# Patient Record
Sex: Female | Born: 1955 | Race: Black or African American | Hispanic: No | Marital: Single | Smoking: Former smoker
Health system: Southern US, Community
[De-identification: ages and names within clinical notes are randomized; demographics above are authoritative.]

## PROBLEM LIST (undated history)

## (undated) DIAGNOSIS — C50919 Malignant neoplasm of unspecified site of unspecified female breast: Secondary | ICD-10-CM

## (undated) DIAGNOSIS — E119 Type 2 diabetes mellitus without complications: Secondary | ICD-10-CM

## (undated) DIAGNOSIS — J45909 Unspecified asthma, uncomplicated: Secondary | ICD-10-CM

## (undated) DIAGNOSIS — Z9981 Dependence on supplemental oxygen: Secondary | ICD-10-CM

## (undated) DIAGNOSIS — I10 Essential (primary) hypertension: Secondary | ICD-10-CM

## (undated) DIAGNOSIS — K219 Gastro-esophageal reflux disease without esophagitis: Secondary | ICD-10-CM

## (undated) DIAGNOSIS — J189 Pneumonia, unspecified organism: Secondary | ICD-10-CM

## (undated) DIAGNOSIS — Q211 Atrial septal defect, unspecified: Secondary | ICD-10-CM

## (undated) DIAGNOSIS — F329 Major depressive disorder, single episode, unspecified: Secondary | ICD-10-CM

## (undated) DIAGNOSIS — IMO0001 Reserved for inherently not codable concepts without codable children: Secondary | ICD-10-CM

## (undated) DIAGNOSIS — G4730 Sleep apnea, unspecified: Secondary | ICD-10-CM

## (undated) DIAGNOSIS — M1990 Unspecified osteoarthritis, unspecified site: Secondary | ICD-10-CM

## (undated) DIAGNOSIS — F32A Depression, unspecified: Secondary | ICD-10-CM

## (undated) DIAGNOSIS — F419 Anxiety disorder, unspecified: Secondary | ICD-10-CM

## (undated) DIAGNOSIS — R079 Chest pain, unspecified: Secondary | ICD-10-CM

## (undated) DIAGNOSIS — J449 Chronic obstructive pulmonary disease, unspecified: Secondary | ICD-10-CM

## (undated) HISTORY — DX: Malignant neoplasm of unspecified site of unspecified female breast: C50.919

## (undated) HISTORY — DX: Type 2 diabetes mellitus without complications: E11.9

---

## 1997-04-15 ENCOUNTER — Encounter: Admission: RE | Admit: 1997-04-15 | Discharge: 1997-04-15 | Payer: Self-pay | Admitting: Family Medicine

## 1997-05-06 ENCOUNTER — Encounter: Admission: RE | Admit: 1997-05-06 | Discharge: 1997-05-06 | Payer: Self-pay | Admitting: Family Medicine

## 2003-08-09 ENCOUNTER — Ambulatory Visit (HOSPITAL_COMMUNITY): Admission: RE | Admit: 2003-08-09 | Discharge: 2003-08-09 | Payer: Self-pay | Admitting: Family Medicine

## 2003-10-18 ENCOUNTER — Ambulatory Visit: Payer: Self-pay | Admitting: Nurse Practitioner

## 2003-11-02 ENCOUNTER — Ambulatory Visit: Payer: Self-pay | Admitting: Nurse Practitioner

## 2003-12-05 ENCOUNTER — Emergency Department (HOSPITAL_COMMUNITY): Admission: EM | Admit: 2003-12-05 | Discharge: 2003-12-05 | Payer: Self-pay | Admitting: Family Medicine

## 2004-02-14 ENCOUNTER — Ambulatory Visit: Payer: Self-pay | Admitting: Nurse Practitioner

## 2004-02-17 ENCOUNTER — Ambulatory Visit: Payer: Self-pay | Admitting: Nurse Practitioner

## 2004-07-24 ENCOUNTER — Ambulatory Visit: Payer: Self-pay | Admitting: Family Medicine

## 2004-09-08 ENCOUNTER — Ambulatory Visit: Payer: Self-pay | Admitting: Nurse Practitioner

## 2004-11-23 ENCOUNTER — Ambulatory Visit: Payer: Self-pay | Admitting: Nurse Practitioner

## 2004-11-29 ENCOUNTER — Ambulatory Visit (HOSPITAL_COMMUNITY): Admission: RE | Admit: 2004-11-29 | Discharge: 2004-11-29 | Payer: Self-pay | Admitting: Internal Medicine

## 2005-01-15 HISTORY — PX: PARTIAL HYSTERECTOMY: SHX80

## 2005-01-15 HISTORY — PX: TOTAL ABDOMINAL HYSTERECTOMY: SHX209

## 2005-02-14 ENCOUNTER — Inpatient Hospital Stay (HOSPITAL_COMMUNITY): Admission: AD | Admit: 2005-02-14 | Discharge: 2005-02-16 | Payer: Self-pay | Admitting: Obstetrics & Gynecology

## 2005-03-12 ENCOUNTER — Ambulatory Visit (HOSPITAL_COMMUNITY): Admission: RE | Admit: 2005-03-12 | Discharge: 2005-03-12 | Payer: Self-pay | Admitting: Obstetrics & Gynecology

## 2005-04-12 ENCOUNTER — Encounter (INDEPENDENT_AMBULATORY_CARE_PROVIDER_SITE_OTHER): Payer: Self-pay | Admitting: Specialist

## 2005-04-12 ENCOUNTER — Inpatient Hospital Stay (HOSPITAL_COMMUNITY): Admission: RE | Admit: 2005-04-12 | Discharge: 2005-04-13 | Payer: Self-pay | Admitting: Obstetrics & Gynecology

## 2007-05-01 ENCOUNTER — Ambulatory Visit (HOSPITAL_COMMUNITY): Admission: RE | Admit: 2007-05-01 | Discharge: 2007-05-01 | Payer: Self-pay | Admitting: Internal Medicine

## 2008-08-01 ENCOUNTER — Emergency Department (HOSPITAL_COMMUNITY): Admission: EM | Admit: 2008-08-01 | Discharge: 2008-08-01 | Payer: Self-pay | Admitting: Emergency Medicine

## 2009-08-08 ENCOUNTER — Encounter: Admission: RE | Admit: 2009-08-08 | Discharge: 2009-08-08 | Payer: Self-pay | Admitting: Internal Medicine

## 2010-02-04 ENCOUNTER — Encounter: Payer: Self-pay | Admitting: Internal Medicine

## 2010-02-05 ENCOUNTER — Encounter: Payer: Self-pay | Admitting: Internal Medicine

## 2010-04-23 LAB — BASIC METABOLIC PANEL
BUN: 9 mg/dL (ref 6–23)
CO2: 24 mEq/L (ref 19–32)
Calcium: 9.7 mg/dL (ref 8.4–10.5)
Chloride: 106 mEq/L (ref 96–112)
Creatinine, Ser: 0.77 mg/dL (ref 0.4–1.2)
GFR calc Af Amer: >60 mL/min (ref >60)
GFR calc non Af Amer: >60 mL/min (ref >60)
Glucose, Bld: 122 mg/dL — ABNORMAL HIGH (ref 70–99)
Potassium: 3.8 mEq/L (ref 3.5–5.1)
Sodium: 136 mEq/L (ref 135–145)

## 2010-04-23 LAB — URINALYSIS, ROUTINE W REFLEX MICROSCOPIC
Bilirubin Urine: NEGATIVE
Glucose, UA: NEGATIVE mg/dL
Hgb urine dipstick: NEGATIVE
Ketones, ur: NEGATIVE mg/dL
Nitrite: NEGATIVE
Protein, ur: NEGATIVE mg/dL
Specific Gravity, Urine: 1.018 (ref 1.005–1.030)
Urobilinogen, UA: 1 mg/dL (ref 0.0–1.0)
pH: 6.5 (ref 5.0–8.0)

## 2010-04-23 LAB — CBC
HCT: 39.4 % (ref 36.0–46.0)
Hemoglobin: 13.5 g/dL (ref 12.0–15.0)
MCHC: 34.3 g/dL (ref 30.0–36.0)
MCV: 92.3 fL (ref 78.0–100.0)
Platelets: 222 10*3/uL (ref 150–400)
RBC: 4.27 MIL/uL (ref 3.87–5.11)
RDW: 13.9 % (ref 11.5–15.5)
WBC: 4.9 10*3/uL (ref 4.0–10.5)

## 2010-04-23 LAB — URINE CULTURE: Colony Count: 60000

## 2010-04-23 LAB — DIFFERENTIAL
Basophils Absolute: 0 10*3/uL (ref 0.0–0.1)
Basophils Relative: 0 % (ref 0–1)
Eosinophils Absolute: 0.1 10*3/uL (ref 0.0–0.7)
Eosinophils Relative: 1 % (ref 0–5)
Lymphocytes Relative: 15 % (ref 12–46)
Lymphs Abs: 0.7 10*3/uL (ref 0.7–4.0)
Monocytes Absolute: 0.3 10*3/uL (ref 0.1–1.0)
Monocytes Relative: 5 % (ref 3–12)
Neutro Abs: 3.8 10*3/uL (ref 1.7–7.7)
Neutrophils Relative %: 78 % — ABNORMAL HIGH (ref 43–77)

## 2010-06-02 NOTE — Discharge Summary (Signed)
[name], [name]                 ACCOUNT NO.:  [id number]   MEDICAL RECORD NO.:  [id number]          PATIENT TYPE:  INP   LOCATION:                                FACILITY:  [facility]   PHYSICIAN:  Roseanna Rainbow, M.D.DATE OF BIRTH:  October 05, 1955   DATE OF ADMISSION:  DATE OF DISCHARGE:  02/16/2005                                 DISCHARGE SUMMARY   CHIEF COMPLAINT:  The patient is 55 year old with uterine fibroids secondary  menorrhagia, now with heavy bleeding.  Please see the dictated history and  physical.   HOSPITAL COURSE:  The patient was admitted and started on high-dose Premarin  and Provera.  Her bleeding diminished in response to this therapy.  Her  hemoglobin on admission was 8.4.  On hospital day #1 it was 8.1.  TSH and  prolactin as well as PT/PTT were within normal limits.  She was discharged  to home on hospital day #2 and she was to continue the Premarin orally.  She  was also given a dose of Depo-Provera 300 mg on the day of discharge.   DISCHARGE DIAGNOSES:  1.  Uterine fibroid.  2.  Vaginal hemorrhage.   CONDITION ON DISCHARGE:  Stable.   DIET:  Regular.   ACTIVITY:  Ad lib.   MEDICATIONS:  1.  Premarin 2.5 mg p.o. b.i.d.  2.  She was to resume home medications.   DISPOSITION:  The patient was to follow up in the office in two weeks.      Roseanna Rainbow, M.D.  Electronically Signed     LAJ/MEDQ  D:  03/02/2005  T:  03/03/2005  Job:  [id number]

## 2010-06-02 NOTE — H&P (Signed)
NAMEKRISSA, [name]                 ACCOUNT NO.:  [id number]   MEDICAL RECORD NO.:  [id number]           PATIENT TYPE:   LOCATION:                                 FACILITY:   PHYSICIAN:  Roseanna Rainbow, M.D.DATE OF BIRTH:  March 10, 1955   DATE OF ADMISSION:  DATE OF DISCHARGE:                                HISTORY & PHYSICAL   CHIEF COMPLAINT:  The patient is a 55 year old with symptomatic uterine  fibroids and a suspected endometrial polyp who presents for diagnostic  laparoscopy, likely total vaginal hysterectomy, possible total abdominal  hysterectomy.   HISTORY OF PRESENT ILLNESS:  The patient gives a one-year history of heavy  menses and painful menses.  Recently the cycles have become irregular  lasting from 7-10 days.  She has a history of secondary anemia.  Her  symptoms have been refractory to attempts at medical management.  Work-up to  date has included a pelvic ultrasound as well as a sonohysterogram.  The  ultrasound was remarkable for multiple small fibroids.  Uterus is  retroverted and there was focal endometrial thickening consistent with a  polyp.  A pelvic ultrasound again demonstrated the sagittal diameter of the  uterus to be approximately 11 cm.  A recent hemoglobin on March 19 was 12.2.   ALLERGIES:  No known drug allergies.   MEDICATIONS:  Hydrochlorothiazide, Prozac, albuterol, Advair, Singulair,  Ambien, Vicodin.   PAST MEDICAL HISTORY:  1.  Asthma.  2.  Depression.  3.  Sarcoidosis.   SOCIAL HISTORY:  She is single.  She recently stopped using cigarettes.  She  drinks a moderate amount of alcoholic beverages.  Denies illicit drug use.   FAMILY HISTORY:  Thyroid disorder.   PAST OB/GYN HISTORY:  Please see the above.  She is status post a postpartum  tubal ligation.  She has been pregnant five times.  She has three living  children.   REVIEW OF SYSTEMS:  GU:  Please see the history of present illness.   PHYSICAL EXAMINATION:  VITAL SIGNS:   Stable, afebrile.  CONSTITUTIONAL:  African-American female.  Appears stated age.  Thin.  ABDOMEN:  Soft, nontender without masses.  PELVIC:  Normal EGBUS.  On speculum examination there is small menstrual  blood present.  On the bimanual examination the uterus is mildly enlarged.  The position is retroverted, nontender.  The adnexa are without masses,  nontender.   ASSESSMENT:  Small fibroid uterus, symptomatic.  Also rule out an  endometrial polyp.  The patient with multiple medical problems.  Preoperative clearance has been granted by Dr. Concepcion Elk of [facility].  The planned  procedure is a diagnostic laparoscopy of likely total vaginal hysterectomy,  possible total abdominal hysterectomy.  The risks, benefits, and alternative  forms of management were reviewed with the patient and informed consent had  been obtained.      Roseanna Rainbow, M.D.  Electronically Signed     LAJ/MEDQ  D:  04/11/2005  T:  04/11/2005  Job:  [id number]

## 2010-06-02 NOTE — H&P (Signed)
[name], PFAHLER                 ACCOUNT NO.:  [id number]   MEDICAL RECORD NO.:  [id number]          PATIENT TYPE:  INP   LOCATION:  9317                          FACILITY:  [facility]   PHYSICIAN:  Roseanna Rainbow, M.D.DATE OF BIRTH:  1955-10-19   DATE OF ADMISSION:  02/14/2005  DATE OF DISCHARGE:                                HISTORY & PHYSICAL   CHIEF COMPLAINT:  The patient is a 55 year old para 0 with uterine fibroids  with secondary menorrhagia, now with heavy bleeding with menses.   HISTORY OF PRESENT ILLNESS:  As above.  The patient gives a history of heavy  menses and dysmenorrhea for 3 years.  She has previously been diagnosed with  anemia.  There has been no previous attempt to manage the menorrhagia  medically.  An ultrasound in November of 2006 demonstrated a uterus that is  retroverted and 11 cm in sagittal diameter with 3 discrete fibroids  measuring 4-5 cm.  There is a question of an endometrial polyp, as well.   PAST MEDICAL HISTORY:  1.  Sarcoidosis.  2.  Asthma.  3.  Depression.   FAMILY HISTORY:  Diabetes, thyroid disease.   SOCIAL HISTORY:  History of tobacco use.  Recently quit.  Increased alcohol  intake.  She reports 10-12 alcoholic beverages per week and increased  caffeinated beverage use.   PHYSICAL EXAMINATION:  VITAL SIGNS:  Stable.  Afebrile.  Blood pressure in  the office earlier today was 190/90.  GENERAL:  Thin African-American female in no apparent distress.  PELVIC:  On speculum exam, there is pooling of blood noted.  On bimanual  exam, the uterus is retroverted, upper limits of normal size, nontender.  The adnexa are non-palpable.   ASSESSMENT:  1.  Uterine fibroids with secondary menorrhagia, now with vaginal      hemorrhage.  2.  History of anemia.  3.  Multiple medical problems.   PLAN:  1.  Admission.  2.  Will check a CBC and coag profile.  3.  Will start high-dose Premarin and check pad counts.  4.  Resume home  medications.      Roseanna Rainbow, M.D.  Electronically Signed     LAJ/MEDQ  D:  02/14/2005  T:  02/14/2005  Job:  [id number]

## 2010-06-02 NOTE — Op Note (Signed)
NAMENICHOL, [name]                 ACCOUNT NO.:  [id number]   MEDICAL RECORD NO.:  [id number]          PATIENT TYPE:  INP   LOCATION:  9317                          FACILITY:  [facility]   PHYSICIAN:  Roseanna Rainbow, M.D.DATE OF BIRTH:  [birth year]   DATE OF PROCEDURE:  04/12/2005  DATE OF DISCHARGE:  04/13/2005                                 OPERATIVE REPORT   PREOPERATIVE DIAGNOSES:  1.  Symptomatic uterine fibroids.  2.  Rule out endometrial polyp.  3.  Secondary dysmenorrhea.  4.  Menometrorrhagia refractory to attempts at medical management.   POSTOPERATIVE DIAGNOSES:  1.  Symptomatic uterine fibroids.  2.  Rule out endometrial polyp.  3.  Secondary dysmenorrhea.  4.  Menometrorrhagia refractory to attempts at medical management.   PROCEDURES:  1.  Diagnostic laparoscopy.  2.  Total vaginal hysterectomy with coring.   SURGEONS:  Roseanna Rainbow, M.D., Bing Neighbors. Clearance Coots, M.D.   ANESTHESIA:  General endotracheal.   COMPLICATIONS:  None.   ESTIMATED BLOOD LOSS:  150 mL.   FLUIDS:  As per anesthesiology.   FINDINGS:  Exam under anesthesia:  A moderately enlarged, retroverted  uterus.  At diagnostic laparoscopy, there was a filmy adhesion extending  from the left tube to the anterior abdominal wall, and this was divided with  Endoshears.  The uterus was globular-appearing with irregular contour and  filled the pelvis posteriorly.  The ovaries appeared normal.  The appendix  was normal-appearing as well.   PROCEDURES:  The risks, benefits, indications and alternatives of the  procedure were reviewed with the patient.  Informed consent had been  obtained.  She was taken to the operating room with an IV running.  She was  placed in the dorsal lithotomy position, prepped and draped in usual sterile  fashion.  Attention was then turned to the patient's abdomen, where a 10 mm  skin incision was made in the umbilical fold.  The fascia was then tented up  with Kocher  clamps and entered sharply.  The parietal peritoneum was tented  up and entered sharply as well.  The peritoneal incision was felt to be free  of adhesions.  The trocar and sleeve were then advanced into the abdomen.  The pneumoperitoneum was obtained.  A survey of the patient's pelvis and  abdomen revealed the above findings.  At this point and the pelvic portion  of the procedure was begun.  A weighted speculum was placed into the vagina.  The cervix was grasped with a Christella Hartigan tenaculum.  The cervix was then  injected cervix circumferentially with 1% lidocaine with 2:100,000 of  epinephrine.  The cervix was circumferentially incised with the scalpel and  the bladder was dissected off the pubovesical cervical fascia anteriorly  with Metzenbaum scissors.  The same procedure was performed posteriorly.  The posterior cul-de-sac was entered sharply without difficulty.  At this  point parametrial clamps were placed over the uterosacral ligaments on  either side.  These were transected and suture ligated with 0 Vicryl.  Hemostasis was assured.  The cardinal ligaments were clamped on both sides,  transected and suture ligated in a similar fashion.  At this point the  anterior cul-de-sac was entered sharply.  The uterine arteries were then  serially clamped with parametrial clamps, transected and suture ligated on  both sides.  At this point a coring technique was used to debulk the uterus.  Both cornua were then ultimately clamped with parametrial clamps, transected  and the uterus delivered.  These pedicles were suture ligated with excellent  hemostasis.  The peritoneum was closed with pursestring suture of 2-0  Vicryl.  The vaginal cuff angles were closed with figure-of-eight stitches  of 0 Vicryl.  The remainder of the vaginal cuff was closed with figure-of-  eight stitches of 0 Vicryl in an interrupted fashion.  At this point  attention was then turned to the abdomen.  A pneumoperitoneum was  then  obtained.  A survey of the pelvis was consistent with adequate hemostasis.  The instruments were then removed from the patient's abdomen and the fascial  incision was reapproximated with 0 Vicryl.  The skin was reapproximated with  4-0 Monocryl and Dermabond.  The patient tolerated the procedure well.  The  sponge, lap and needle counts were correct x2.  The patient was taken to the  PACU awake and in stable condition.      Roseanna Rainbow, M.D.  Electronically Signed     LAJ/MEDQ  D:  04/12/2005  T:  04/14/2005  Job:  [id number]

## 2012-04-07 ENCOUNTER — Other Ambulatory Visit: Payer: Self-pay

## 2012-04-07 DIAGNOSIS — Z1231 Encounter for screening mammogram for malignant neoplasm of breast: Secondary | ICD-10-CM

## 2012-04-25 ENCOUNTER — Ambulatory Visit: Payer: Self-pay

## 2012-05-07 ENCOUNTER — Ambulatory Visit: Payer: Self-pay

## 2012-05-14 ENCOUNTER — Ambulatory Visit
Admission: RE | Admit: 2012-05-14 | Discharge: 2012-05-14 | Disposition: A | Discharge status: Home / Self Care | Payer: PRIVATE HEALTH INSURANCE | Source: Ambulatory Visit

## 2012-05-14 DIAGNOSIS — Z1231 Encounter for screening mammogram for malignant neoplasm of breast: Secondary | ICD-10-CM

## 2012-11-03 ENCOUNTER — Other Ambulatory Visit: Payer: Self-pay | Admitting: Internal Medicine

## 2012-11-03 DIAGNOSIS — K219 Gastro-esophageal reflux disease without esophagitis: Secondary | ICD-10-CM

## 2012-11-07 ENCOUNTER — Ambulatory Visit
Admission: RE | Admit: 2012-11-07 | Discharge: 2012-11-07 | Disposition: A | Discharge status: Home / Self Care | Payer: PRIVATE HEALTH INSURANCE | Source: Ambulatory Visit | Attending: Internal Medicine | Admitting: Internal Medicine

## 2012-11-07 DIAGNOSIS — K219 Gastro-esophageal reflux disease without esophagitis: Secondary | ICD-10-CM

## 2012-11-12 ENCOUNTER — Other Ambulatory Visit: Payer: Self-pay | Admitting: Internal Medicine

## 2012-12-03 ENCOUNTER — Other Ambulatory Visit: Payer: Self-pay | Admitting: Internal Medicine

## 2013-01-01 ENCOUNTER — Other Ambulatory Visit: Payer: Self-pay | Admitting: Internal Medicine

## 2013-01-06 ENCOUNTER — Other Ambulatory Visit: Payer: Self-pay | Admitting: Internal Medicine

## 2013-02-03 ENCOUNTER — Other Ambulatory Visit: Payer: Self-pay | Admitting: Internal Medicine

## 2013-02-26 ENCOUNTER — Other Ambulatory Visit: Payer: Self-pay | Admitting: Internal Medicine

## 2013-05-19 ENCOUNTER — Other Ambulatory Visit: Payer: Self-pay

## 2013-05-19 DIAGNOSIS — Z1231 Encounter for screening mammogram for malignant neoplasm of breast: Secondary | ICD-10-CM

## 2013-06-03 ENCOUNTER — Ambulatory Visit
Admission: RE | Admit: 2013-06-03 | Discharge: 2013-06-03 | Disposition: A | Discharge status: Home / Self Care | Payer: PRIVATE HEALTH INSURANCE | Source: Ambulatory Visit

## 2013-06-03 ENCOUNTER — Encounter (INDEPENDENT_AMBULATORY_CARE_PROVIDER_SITE_OTHER): Payer: Self-pay

## 2013-06-03 DIAGNOSIS — Z1231 Encounter for screening mammogram for malignant neoplasm of breast: Secondary | ICD-10-CM

## 2014-01-15 DIAGNOSIS — R079 Chest pain, unspecified: Secondary | ICD-10-CM

## 2014-01-15 HISTORY — DX: Chest pain, unspecified: R07.9

## 2014-06-25 ENCOUNTER — Encounter: Payer: Self-pay | Admitting: Internal Medicine

## 2014-06-25 ENCOUNTER — Ambulatory Visit (INDEPENDENT_AMBULATORY_CARE_PROVIDER_SITE_OTHER): Payer: Medicare Other | Admitting: Internal Medicine

## 2014-06-25 VITALS — BP 140/76 | HR 71 | Ht 67.0 in | Wt 202.8 lb

## 2014-06-25 DIAGNOSIS — I10 Essential (primary) hypertension: Secondary | ICD-10-CM

## 2014-06-25 DIAGNOSIS — R0600 Dyspnea, unspecified: Secondary | ICD-10-CM

## 2014-06-25 DIAGNOSIS — J449 Chronic obstructive pulmonary disease, unspecified: Secondary | ICD-10-CM

## 2014-06-25 MED ORDER — MOMETASONE FURO-FORMOTEROL FUM 100-5 MCG/ACT IN AERO: INHALATION_SPRAY | RESPIRATORY_TRACT | Status: DC

## 2014-06-25 NOTE — Progress Notes (Signed)
   Subjective:    [name], female    DOB: December 17, 1955,   MRN: [id number]  HPI  9 yobf quit smoking 2010 with onset doe 2008 and when quit smoking wt around 170-180 with breathing gradually worse since quit smoking referred to pulmonary clinic 06/25/14 by Dr Rickey Barbara    06/25/2014 1st [facility] office visit/ Consandra Laske   Chief Complaint  Patient presents with  . Pulmonary Consult    Referred by Dr. Justice Britain. Pt c/o SOB- started a few months ago. She is concerned about mold in her home causing her breathing problem. She gets SOB walking up an incline. She is using proair 2-3 x per day and neb 4-5 x per day.   gradually worse doe  x 1 year, better with inhaler and neb but not advair with raspy upper airway coughing fits more day than noct assoc with overt HB  No obvious day to day or daytime variabilty or assoc classically pleuritic or ex  cp or chest tightness, subjective wheeze overt sinus or hb symptoms. No unusual exp hx or h/o childhood pna/ asthma or knowledge of premature birth.  Sleeping ok without nocturnal  or early am exacerbation  of respiratory  c/o's or need for noct saba. Also denies any obvious fluctuation of symptoms with weather or environmental changes or other aggravating or alleviating factors except as outlined above   Current Medications, Allergies, Complete Past Medical History, Past Surgical History, Family History, and Social History were reviewed in [employer] record.             Review of Systems  Constitutional: Negative for fever, chills and unexpected weight change.  HENT: Positive for congestion. Negative for dental problem, ear pain, nosebleeds, postnasal drip, rhinorrhea, sinus pressure, sneezing, sore throat, trouble swallowing and voice change.   Eyes: Negative for visual disturbance.  Respiratory: Positive for shortness of breath. Negative for cough and choking.   Cardiovascular:  Negative for chest pain and leg swelling.  Gastrointestinal: Negative for vomiting, abdominal pain and diarrhea.  Genitourinary: Negative for difficulty urinating.       Indigestion  Musculoskeletal: Positive for arthralgias.  Skin: Negative for rash.  Neurological: Negative for tremors, syncope and headaches.  Hematological: Does not bruise/bleed easily.       Objective:   Physical Exam  Very hoarse amb bf  Wt Readings from Last 3 Encounters:  06/25/14 202 lb 12.8 oz (91.989 kg)    Vital signs reviewed    HEENT: edentulousl with nl turbinates, and orophanx. Nl external ear canals without cough reflex   NECK :  without JVD/Nodes/TM/ nl carotid upstrokes bilaterally   LUNGS: no acc muscle use, clear to A and P bilaterally without cough on insp or exp maneuvers   CV:  RRR  no s3 or murmur or increase in P2, no edema   ABD:  soft and nontender with nl excursion in the supine position. No bruits or organomegaly, bowel sounds nl  MS:  warm without deformities, calf tenderness, cyanosis or clubbing  SKIN: warm and dry without lesions    NEURO:  alert, approp, no deficits    cxr rec / not done       Assessment & Plan:

## 2014-06-25 NOTE — Patient Instructions (Addendum)
Stop advair  Start dulera 100 Take 2 puffs first thing in am and then another 2 puffs about 12 hours later.   Only use your albuterol (proair) as a rescue medication to be used if you can't catch your breath by resting or doing a relaxed purse lip breathing pattern.  - The less you use it, the better it will work when you need it. - Ok to use up to 2 puffs  every 4 hours if you must but call for immediate appointment if use goes up over your usual need - Don't leave home without it !!  (think of it like the spare tire for your car)   Only use nebulizer if you first try proair and it doesn't work   Nexium 40 mg   Take  30-60 min before first meal of the day and Pepcid (famotidine)  20 mg one @  bedtime until return to office - this is the best way to tell whether stomach acid is contributing to your problem.    GERD (REFLUX)  is an extremely common cause of respiratory symptoms just like yours , many times with no obvious heartburn at all.    It can be treated with medication, but also with lifestyle changes including avoidance of late meals, elevation of the head of your bed (ideally with 6 inch  bed blocks) excessive alcohol, smoking cessation, and avoid fatty foods, chocolate, peppermint, colas, red wine, and acidic juices such as orange juice.  NO MINT OR MENTHOL PRODUCTS SO NO COUGH DROPS  USE SUGARLESS CANDY INSTEAD (Jolley ranchers or Stover's or Life Savers) or even ice chips will also do - the key is to swallow to prevent all throat clearing. NO OIL BASED VITAMINS - use powdered substitutes.    See Tammy NP w/in 2 weeks with all your medications, even over the counter meds, separated in two separate bags, the ones you take no matter what vs the ones you stop once you feel better and take only as needed when you feel you need them.   Tammy  will generate for you a new user friendly medication calendar that will put [place] all on the same page re: your medication use.     Without this  process, it simply isn't possible to assure that we are providing  your outpatient care  with  the attention to detail we feel you deserve.   If we cannot assure that you're getting that kind of care,  then we cannot manage your problem effectively from this clinic.  Once you have seen Tammy and we are sure that we're all on the same page with your medication use she will arrange follow up with me with pfts on return   Please remember to go to the  x-ray department downstairs for your tests - we will call you with the results when they are available. Late add: did not go for xrays as requested / consider change to bisoprolol prior to pfts

## 2014-06-26 ENCOUNTER — Encounter: Payer: Self-pay | Admitting: Internal Medicine

## 2014-06-26 DIAGNOSIS — J449 Chronic obstructive pulmonary disease, unspecified: Secondary | ICD-10-CM | POA: Insufficient documentation

## 2014-06-26 DIAGNOSIS — I10 Essential (primary) hypertension: Secondary | ICD-10-CM | POA: Insufficient documentation

## 2014-06-26 DIAGNOSIS — J432 Centrilobular emphysema: Secondary | ICD-10-CM | POA: Insufficient documentation

## 2014-06-26 NOTE — Assessment & Plan Note (Signed)
Consider if remains beta agonist dep:  i  Bystolic, the most beta -1  selective Beta blocker available in sample form, with bisoprolol the most selective generic choice  on the market.

## 2014-06-26 NOTE — Assessment & Plan Note (Addendum)
  I reviewed the Fletcher curve with the patient that basically indicates  if you quit smoking when your best day FEV1 is still well preserved (as is probably   the case here)  it is highly unlikely you will progress to severe disease and informed the patient there was no medication on the market that has proven to alter the curve/ its downward trajectory  or the likelihood of progression of their disease.  Therefore stopping smoking and maintaining abstinence is the most important aspect of care, not choice of inhalers or for that matter, doctors.    Will sort out what she needs at f/u ov with pfts   I had an extended discussion with the patient reviewing all relevant studies completed to date and  lasting 53 m Each maintenance medication was reviewed in detail including most importantly the difference between maintenance and as needed and under what circumstances the prns are to be used.  Please see instructions for details which were reviewed in writing and the patient given a copy.

## 2014-06-26 NOTE — Assessment & Plan Note (Addendum)
  When respiratory symptoms begin or become refractory well after a patient reports complete smoking cessation,  Especially when this wasn't the case while they were smoking, a red flag is raised based on the work of Dr Kris Mouton which states:  if you quit smoking when your best day FEV1 is still well preserved it is highly unlikely you will progress to severe disease.  That is to say, once the smoking stops,  the symptoms should not suddenly erupt or markedly worsen.  If so, the differential diagnosis should include  obesity/deconditioning,  LPR/Reflux/Aspiration syndromes,  occult CHF, or  especially side effect of medications commonly used in this population.    ? LPR >> Acid (or non-acid) GERD contributing  > always difficult to exclude as up to 75% of pts in some series report no assoc GI/ Heartburn symptoms and she has overt HB symptoms which raises the likelihood to 100%)   > rec max (24h)  acid suppression and diet restrictions/ reviewed and instructions given in writing.  Advair not helping her as much as saba suggesting she may have a significant upper airway issue (85% of this drug ends up in the upper airway where it is not needed even with good technique and her upper airway may be irritable from LPR)   ? Adverse drug effect >>? advair side effects> try change to dulera 100 pending pfts   ? Obesity/ deconditioning  Body mass index is 31.76 kg/(m^2).  > she has gained about 25 lbs since stopped smoking and would certainly help to get back down to baseline wt

## 2014-07-09 ENCOUNTER — Encounter: Payer: Self-pay | Admitting: Adult Health

## 2014-07-09 ENCOUNTER — Ambulatory Visit (INDEPENDENT_AMBULATORY_CARE_PROVIDER_SITE_OTHER): Payer: Medicare Other | Admitting: Adult Health

## 2014-07-09 ENCOUNTER — Ambulatory Visit (INDEPENDENT_AMBULATORY_CARE_PROVIDER_SITE_OTHER)
Admission: RE | Admit: 2014-07-09 | Discharge: 2014-07-09 | Disposition: A | Discharge status: Home / Self Care | Payer: Medicare Other | Source: Ambulatory Visit | Attending: Internal Medicine | Admitting: Internal Medicine

## 2014-07-09 VITALS — BP 142/66 | HR 76 | Temp 98.0°F | Ht 67.0 in | Wt 200.0 lb

## 2014-07-09 DIAGNOSIS — R0600 Dyspnea, unspecified: Secondary | ICD-10-CM | POA: Diagnosis not present

## 2014-07-09 DIAGNOSIS — J449 Chronic obstructive pulmonary disease, unspecified: Secondary | ICD-10-CM | POA: Diagnosis not present

## 2014-07-09 DIAGNOSIS — I10 Essential (primary) hypertension: Secondary | ICD-10-CM | POA: Diagnosis not present

## 2014-07-09 MED ORDER — MOMETASONE FURO-FORMOTEROL FUM 200-5 MCG/ACT IN AERO: 2.0000 | INHALATION_SPRAY | Freq: Two times a day (BID) | RESPIRATORY_TRACT | Status: DC

## 2014-07-09 MED ORDER — BISOPROLOL FUMARATE 10 MG PO TABS: 10.0000 mg | ORAL_TABLET | Freq: Two times a day (BID) | ORAL | Status: DC

## 2014-07-09 NOTE — Assessment & Plan Note (Signed)
Patient is on high-dose nonselective beta blocker  plan Stop Metoprolol .  Begin Bisoprolol 10mg  .Twice daily   follow up [name]  In 3 weeks with PFT  Please contact office for sooner follow up if symptoms do not improve or worsen or seek emergency care

## 2014-07-09 NOTE — Progress Notes (Signed)
   Subjective:    [name], female    DOB: 1955/09/29,   MRN: [id number]  HPI  21 yobf quit smoking 2010 with onset doe 2008 and when quit smoking wt around 170-180 with breathing gradually worse since quit smoking referred to pulmonary clinic 06/25/14 by Dr Rickey Barbara    06/25/2014 1st [facility] office visit/ Wert   Chief Complaint  Patient presents with  . Pulmonary Consult    Referred by Dr. Justice Britain. Pt c/o SOB- started a few months ago. She is concerned about mold in her home causing her breathing problem. She gets SOB walking up an incline. She is using proair 2-3 x per day and neb 4-5 x per day.   gradually worse doe  x 1 year, better with inhaler and neb but not advair with raspy upper airway coughing fits more day than noct assoc with overt HB >>changed Advair to [facility]  07/09/2014 follow-up and medication review Patient returns for a two-week follow-up and medication review. He was seen last visit for a pulmonary consult for shortness of breath and possible COPD. She was changed from Advair to [facility].  Pt c/o dry cough, occasional wheezing. Pt states overall breathing has improved.  He denies any hemoptysis, chest pain, orthopnea, PND, or increased leg swelling. Patient has not had PFTs. And she did not do her chest x-ray as recommended last visit.   Current Medications, Allergies, Complete Past Medical History, Past Surgical History, Family History, and Social History were reviewed in [employer] record.             Review of Systems  Constitutional: Negative for fever, chills and unexpected weight change.  HENT: Positive for congestion. Negative for dental problem, ear pain, nosebleeds, postnasal drip, rhinorrhea, sinus pressure, sneezing, sore throat, trouble swallowing and voice change.   Eyes: Negative for visual disturbance.  Respiratory: Positive for shortness of breath. Negative for cough and  choking.   Cardiovascular: Negative for chest pain and leg swelling.  Gastrointestinal: Negative for vomiting, abdominal pain and diarrhea.  Genitourinary: Negative for difficulty urinating.       Indigestion  Musculoskeletal: Positive for arthralgias.  Skin: Negative for rash.  Neurological: Negative for tremors, syncope and headaches.  Hematological: Does not bruise/bleed easily.       Objective:   Physical Exam  Very hoarse amb bf   Vital signs reviewed    HEENT: edentulousl with nl turbinates, and orophanx. Nl external ear canals without cough reflex   NECK :  without JVD/Nodes/TM/ nl carotid upstrokes bilaterally   LUNGS: no acc muscle use, clear to A and P bilaterally without cough on insp or exp maneuvers   CV:  RRR  no s3 or murmur or increase in P2, no edema   ABD:  soft and nontender with nl excursion in the supine position. No bruits or organomegaly, bowel sounds nl  MS:  warm without deformities, calf tenderness, cyanosis or clubbing  SKIN: warm and dry without lesions    NEURO:  alert, approp, no deficits          Assessment & Plan:

## 2014-07-09 NOTE — Patient Instructions (Signed)
Stop Metoprolol .  Begin Bisoprolol 10mg  .Twice daily   Chest xray today .  Follow med calendar closely and bring to each visit.  follow up [name]  In 3 weeks with PFT  Please contact office for sooner follow up if symptoms do not improve or worsen or seek emergency care

## 2014-07-09 NOTE — Assessment & Plan Note (Signed)
Probable COPD and a former smoker Check chest x-ray today Follow-up PFTs on return We'll change metoprolol over to bisoprolol due to underlying shortness of breath and intermittent wheezing..   Plan Stop Metoprolol .  Begin Bisoprolol 10mg  .Twice daily   Chest xray today .  Follow med calendar closely and bring to each visit.  follow up [name]  In 3 weeks with PFT  Please contact office for sooner follow up if symptoms do not improve or worsen or seek emergency care

## 2014-07-09 NOTE — Progress Notes (Signed)
Chart and office note reviewed in detail  > agree with a/p as outlined    

## 2014-07-12 NOTE — Addendum Note (Signed)
Addended by: Parke Poisson E on: 07/12/2014 11:47 AM   Modules accepted: Orders, Medications

## 2014-07-14 ENCOUNTER — Telehealth: Payer: Self-pay | Admitting: *Deleted

## 2014-07-14 NOTE — Telephone Encounter (Signed)
Called [facility] at [phone] to start PA for Dulera 200-5. Pt has hx of tried/failure Advair 250/5- and ProAir. Pt ins ID: [id number] PA submitted to clinical team for review,decision will be faxed 24-72 hrs.

## 2014-07-15 NOTE — Telephone Encounter (Signed)
Pts ins will not cover Dulera.

## 2014-07-16 NOTE — Telephone Encounter (Signed)
Attempted to contact pt. No answer, no option to leave a message. Will try back.  

## 2014-07-16 NOTE — Telephone Encounter (Signed)
MW- pa for [name] has been denied.  Please advise on alternative med for pt.  Thanks!

## 2014-07-16 NOTE — Telephone Encounter (Signed)
Should cover symbicort 160 2bid and if it's commercial insurance there is a 0 copay guarantee she can use and we can call [facility] and let them help if any problem getting that filled

## 2014-07-21 ENCOUNTER — Other Ambulatory Visit: Payer: Self-pay | Admitting: *Deleted

## 2014-07-21 MED ORDER — BUDESONIDE-FORMOTEROL FUMARATE 160-4.5 MCG/ACT IN AERO: 2.0000 | INHALATION_SPRAY | Freq: Two times a day (BID) | RESPIRATORY_TRACT | Status: DC

## 2014-07-21 NOTE — Telephone Encounter (Signed)
Symbicort has been submitted to the pharmacy. Tried to call patient again no answer,nor answering machine.

## 2014-07-22 ENCOUNTER — Telehealth: Payer: Self-pay | Admitting: Internal Medicine

## 2014-07-22 NOTE — Telephone Encounter (Signed)
Spoke with pharmacist, wanted to clarify that symbicort is to replace pt's advair.  Per pt's last ov note with MW this is correct.  Nothing further needed.

## 2014-08-06 ENCOUNTER — Other Ambulatory Visit: Payer: Self-pay | Admitting: *Deleted

## 2014-08-06 DIAGNOSIS — R0600 Dyspnea, unspecified: Secondary | ICD-10-CM

## 2014-08-09 ENCOUNTER — Ambulatory Visit (INDEPENDENT_AMBULATORY_CARE_PROVIDER_SITE_OTHER): Payer: Medicare Other | Admitting: Internal Medicine

## 2014-08-09 ENCOUNTER — Encounter (INDEPENDENT_AMBULATORY_CARE_PROVIDER_SITE_OTHER): Payer: Self-pay

## 2014-08-09 ENCOUNTER — Encounter: Payer: Self-pay | Admitting: Internal Medicine

## 2014-08-09 VITALS — BP 134/70 | HR 102 | Ht 67.0 in | Wt 205.0 lb

## 2014-08-09 DIAGNOSIS — R0600 Dyspnea, unspecified: Secondary | ICD-10-CM

## 2014-08-09 DIAGNOSIS — J449 Chronic obstructive pulmonary disease, unspecified: Secondary | ICD-10-CM

## 2014-08-09 DIAGNOSIS — E669 Obesity, unspecified: Secondary | ICD-10-CM

## 2014-08-09 LAB — PULMONARY FUNCTION TEST
DL/VA % pred: 49 %
DL/VA: 2.56 ml/min/mmHg/L
DLCO UNC: 19.52 ml/min/mmHg
DLCO unc % pred: 68 %
FEF 25-75 POST: 0.83 L/s
FEF 25-75 PRE: 1.41 L/s
FEF2575-%Change-Post: -41 %
FEF2575-%Pred-Post: 35 %
FEF2575-%Pred-Pre: 60 %
FEV1-%Change-Post: -21 %
FEV1-%Pred-Post: 64 %
FEV1-%Pred-Pre: 83 %
FEV1-POST: 1.55 L
FEV1-Pre: 1.99 L
FEV1FVC-%Change-Post: -14 %
FEV1FVC-%PRED-PRE: 91 %
FEV6-%Change-Post: -8 %
FEV6-%PRED-PRE: 92 %
FEV6-%Pred-Post: 85 %
FEV6-PRE: 2.72 L
FEV6-Post: 2.49 L
FEV6FVC-%Change-Post: 0 %
FEV6FVC-%PRED-POST: 102 %
FEV6FVC-%Pred-Pre: 102 %
FVC-%CHANGE-POST: -8 %
FVC-%PRED-PRE: 89 %
FVC-%Pred-Post: 82 %
FVC-Post: 2.5 L
FVC-Pre: 2.73 L
PRE FEV6/FVC RATIO: 100 %
Post FEV1/FVC ratio: 62 %
Post FEV6/FVC ratio: 100 %
Pre FEV1/FVC ratio: 73 %
RV % PRED: 159 %
RV: 3.38 L
TLC % pred: 117 %
TLC: 6.46 L

## 2014-08-09 NOTE — Patient Instructions (Addendum)
Stop symbicort and dulera for now   Only use your albuterol as a rescue medication to be used if you can't catch your breath by resting or doing a relaxed purse lip breathing pattern.  - The less you use it, the better it will work when you need it. - Ok to use up to 2 puffs  every 4 hours if you must but call for immediate appointment if use goes up over your usual need - Don't leave home without it !!  (think of it like the spare tire for your car)   Nexium should be Take 30-60 min before first meal of the day and Pepcid 20 mg at bedtime until you return   GERD (REFLUX)  is an extremely common cause of respiratory symptoms just like yours , many times with no obvious heartburn at all.    It can be treated with medication, but also with lifestyle changes including elevation of the head of your bed (ideally with 6 inch  bed blocks),  Smoking cessation, avoidance of late meals, excessive alcohol, and avoid fatty foods, chocolate, peppermint, colas, red wine, and acidic juices such as orange juice.  NO MINT OR MENTHOL PRODUCTS SO NO COUGH DROPS  USE SUGARLESS CANDY INSTEAD (Jolley ranchers or Stover's or Life Savers) or even ice chips will also do - the key is to swallow to prevent all throat clearing. NO OIL BASED VITAMINS - use powdered substitutes.    Please schedule a follow up office visit in 4  weeks, sooner if needed to see Tammy with all medications in hand

## 2014-08-09 NOTE — Progress Notes (Addendum)
Subjective:    [name], female    DOB: 04-16-1955,   MRN: [id number]    Brief patient profile:  45 yobf quit smoking 2010 with onset doe 2008 and when quit smoking wt around 170-180 with breathing gradually worse since quit smoking referred to pulmonary clinic 06/25/14 by [name] with nl pfts 08/09/14 x for erv 51% while on symbicort    History of Present Illness  06/25/2014 1st [facility] office visit/ [name]   Chief Complaint  Patient presents with  . Pulmonary Consult    Referred by Abigail. Justice Rivera. Pt c/o SOB- started a few months ago. She is concerned about mold in her home causing her breathing problem. She gets SOB walking up an incline. She is using proair 2-3 x per day and neb 4-5 x per day.   gradually worse doe  x 1 year, better with inhaler and neb but not advair with raspy upper airway coughing fits more day than noct assoc with overt HB >Stop advair Start dulera 100 Take 2 puffs first thing in am and then another 2 puffs about 12 hours later.  Only use your albuterol (proair) Only use nebulizer if you first try proair and it doesn't work  Nexium 40 mg   Take  30-60 min before first meal of the day and Pepcid (famotidine)  20 mg one @  bedtime until return to office - this is the best way to tell whether stomach acid is contributing to your problem.     07/09/2014 follow-up and medication review Patient returns for a two-week follow-up and medication review. He was seen last visit for a pulmonary consult for shortness of breath and possible COPD. She was changed from Advair to [employer]  Pt c/o dry cough, occasional wheezing. Pt states overall breathing has improved.  denies any hemoptysis, chest pain, orthopnea, PND, or increased leg swelling. Patient has not had PFTs. rec Stop Metoprolol .  Begin Bisoprolol 10mg  .Twice daily   Chest xray today .  Follow med calendar closely and bring to each visit.  follow up [name]   In 3 weeks with PFT    08/09/2014 f/u ov/[name] re:  Unexplained sob/ no obst on pfts p am symbicort   Chief Complaint  Patient presents with  . Follow-up    PFT done today. Pt right arm pain radiating to her chest for the past wk. She states that her breathing has improved some. She is only using rescue inhaler 2 x per wk on average.        Flat surface ok/ no long distances like a mall but as long as takes her time she does fine s cp with exertion  No obvious day to day or daytime variability or assoc chronic cough or cp or chest tightness, subjective wheeze or overt sinus or hb symptoms. No unusual exp hx or h/o childhood pna/ asthma or knowledge of premature birth.  Sleeping ok without nocturnal  or early am exacerbation  of respiratory  c/o's or need for noct saba. Also denies any obvious fluctuation of symptoms with weather or environmental changes or other aggravating or alleviating factors except as outlined above   Current Medications, Allergies, Complete Past Medical History, Past Surgical History, Family History, and Social History were reviewed in [employer] record.  ROS  The following are not active complaints unless bolded sore throat, dysphagia, dental problems, itching, sneezing,  nasal congestion or excess/ purulent secretions, ear  ache,   fever, chills, sweats, unintended wt loss, classically pleuritic or exertional cp, hemoptysis,  orthopnea pnd or leg swelling, presyncope, palpitations, abdominal pain, anorexia, nausea, vomiting, diarrhea  or change in bowel or bladder habits, change in stools or urine, dysuria,hematuria,  rash, arthralgias, visual complaints, headache, numbness, weakness or ataxia or problems with walking or coordination,  change in mood/affect or memory.                Objective:   Physical Exam  amb still hoarse bf nad   Wt Readings from Last 3 Encounters:  08/09/14 205 lb (92.987 kg)  07/09/14 200 lb (90.719 kg)    06/25/14 202 lb 12.8 oz (91.989 kg)    Vital signs reviewed    HEENT: edentulousl with nl turbinates, and orophanx. Nl external ear canals without cough reflex   NECK :  without JVD/Nodes/TM/ nl carotid upstrokes bilaterally   LUNGS: no acc muscle use, clear to A and P bilaterally without cough on insp or exp maneuvers   CV:  RRR  no s3 or murmur or increase in P2, no edema   ABD:  soft and nontender with nl excursion in the supine position. No bruits or organomegaly, bowel sounds nl  MS:  warm without deformities, calf tenderness, cyanosis or clubbing  SKIN: warm and dry without lesions    NEURO:  alert, approp, no deficits    I personally reviewed images and agree with radiology impression as follows:  CXR:  07/09/14  No active cardiopulmonary disease.      Assessment & Plan:   Outpatient Encounter Prescriptions as of 08/09/2014  Medication Sig  . albuterol (PROAIR HFA) 108 (90 BASE) MCG/ACT inhaler Inhale 2 puffs into the lungs every 4 (four) hours as needed for wheezing or shortness of breath (((PLAN B))).   Marland Kitchen albuterol (PROVENTIL) (2.5 MG/3ML) 0.083% nebulizer solution Take 2.5 mg by nebulization every 4 (four) hours as needed for wheezing or shortness of breath (((PLAN C))).   Marland Kitchen amitriptyline (ELAVIL) 50 MG tablet 2 tabs by mouth at bedtime  . aspirin 81 MG tablet Take 81 mg by mouth daily.  . bisoprolol (ZEBETA) 10 MG tablet Take 1 tablet (10 mg total) by mouth 2 (two) times daily.  . budesonide-formoterol (SYMBICORT) 160-4.5 MCG/ACT inhaler Inhale 2 puffs into the lungs 2 (two) times daily.  Marland Kitchen esomeprazole (NEXIUM) 40 MG capsule Take 40 mg by mouth daily at 12 noon.  Marland Kitchen FLUoxetine (PROZAC) 20 MG tablet Take 20 mg by mouth daily.  . fluticasone (FLONASE) 50 MCG/ACT nasal spray Place 2 sprays into both nostrils daily as needed for allergies or rhinitis.  . Linaclotide (LINZESS) 145 MCG CAPS capsule Take 145 mcg by mouth daily.  Marland Kitchen loratadine (CLARITIN) 10 MG tablet  Take 10 mg by mouth daily.  . montelukast (SINGULAIR) 10 MG tablet Take 10 mg by mouth at bedtime.  . naproxen (NAPROSYN) 500 MG tablet Take 500 mg by mouth 2 (two) times daily as needed.  . nitroGLYCERIN (NITROSTAT) 0.3 MG SL tablet Place 0.3 mg under the tongue every 5 (five) minutes as needed for chest pain (may repeat x3).  Marland Kitchen tiZANidine (ZANAFLEX) 4 MG tablet Take 4 mg by mouth at bedtime as needed for muscle spasms.   Marland Kitchen zolpidem (AMBIEN) 10 MG tablet Take 10 mg by mouth at bedtime as needed for sleep.  . [DISCONTINUED] amLODipine (NORVASC) 10 MG tablet Take 10 mg by mouth daily.  . [DISCONTINUED] losartan-hydrochlorothiazide (HYZAAR) 100-25 MG per tablet Take  1 tablet by mouth daily.  . [DISCONTINUED] mometasone-formoterol (DULERA) 200-5 MCG/ACT AERO Inhale 2 puffs into the lungs 2 (two) times daily. (Patient not taking: Reported on 08/09/2014)   No facility-administered encounter medications on file as of 08/09/2014.

## 2014-08-09 NOTE — Progress Notes (Signed)
PFT performed today. 

## 2014-08-15 ENCOUNTER — Encounter: Payer: Self-pay | Admitting: Internal Medicine

## 2014-08-15 DIAGNOSIS — Z6835 Body mass index (BMI) 35.0-35.9, adult: Secondary | ICD-10-CM | POA: Insufficient documentation

## 2014-08-15 DIAGNOSIS — E669 Obesity, unspecified: Secondary | ICD-10-CM | POA: Insufficient documentation

## 2014-08-15 NOTE — Assessment & Plan Note (Signed)
-   quit smoking 2010  - changed advair to dulera 100 06/26/2014  - PFts 08/09/14 without obst p am symbiocrt >rec try off maint rx   Clearly no evidence of sign airflow obst which does not rule out asthma but there is no sign copd    I reviewed the Fletcher curve with the patient that basically indicates  if you quit smoking when your best day FEV1 is still well preserved (as is clearly  the case here)  it is highly unlikely you will progress to severe disease and informed the patient there was no medication on the market that has proven to alter the curve/ its downward trajectory  or the likelihood of progression of their disease.  Therefore   maintaining abstinence from cigs  at this point  is the most important aspect of care, not choice of inhalers or for that matter, doctors.

## 2014-08-15 NOTE — Assessment & Plan Note (Addendum)
-   d/c advair 06/25/2014 > changed to dulera 100 2bid> try off 08/09/14 as no airflow obst on pfts - PFT's 08/09/14 c ERV 51%  - 08/09/2014  Walked RA x 3 laps @ 185 ft each stopped due to  End of study, nl pace min sob/ no  desat    Symptoms are  disproportionate to objective findings and not clear this is a lung problem but pt does appear to have difficult airway management issues.   DDX of  difficult airways management all start with A and  include Adherence, Ace Inhibitors, Acid Reflux, Active Sinus Disease, Alpha 1 Antitripsin deficiency, Anxiety masquerading as Airways dz,  ABPA,  allergy(esp in young), Aspiration (esp in elderly), Adverse effects of meds,  Active smokers, A bunch of PE's (a small clot burden can't cause this syndrome unless there is already severe underlying pulm or vascular dz with poor reserve) plus two Bs  = Bronchiectasis and Beta blocker use..and one C= CHF  Adherence is always the initial "prime suspect" and is a multilayered concern that requires a "trust but verify" approach in every patient - starting with knowing how to use medications, especially inhalers, correctly, keeping up with refills and understanding the fundamental difference between maintenance and prns vs those medications only taken for a very short course and then stopped and not refilled.   ? Acid (or non-acid) GERD > always difficult to exclude as up to 75% of pts in some series report no assoc GI/ Heartburn symptoms> rec max (24h)  acid suppression and diet restrictions/ reviewed and instructions given in writing.   ? Allergy/ asthma > still possible so rec continue singulair for now and  explained the point of a therapeutic w/d off ICS and observe for flare of symptoms or increase need for saba  ? Occult chf/ no evidencefor it but is at risk of diastolic dysfunction due to obesity and hbp  I had an extended discussion with the patient reviewing all relevant studies completed to date and  lasting 15 to 20  minutes of a 25 minute visit    Each maintenance medication was reviewed in detail including most importantly the difference between maintenance and prns and under what circumstances the prns are to be triggered using an action plan format that is not reflected in the computer generated alphabetically organized AVS.    Please see instructions for details which were reviewed in writing and the patient given a copy highlighting the part that I personally wrote and discussed at today's ov.

## 2014-08-19 NOTE — H&P (Signed)
OFFICE VISIT NOTES COPIED TO EPIC FOR DOCUMENTATION  [name]. Bartell 08/03/2014 8:20 AM Location: [facility] Patient #: [id number] DOB: 25-May-1955 Single / Language: [name] / Race: Black or African American Female  History of Present Illness [name]; 08/03/2014 11:34 AM) The patient is a 59 year old female who presents for a Follow-up for Shortness of breath. Symptoms include dyspnea and exercise intolerance. Onset was gradual year(s) ago. The patient describes this as moderate in severity and worsening. Pertinent medical history includes chronic obstructive pulmonary disease (pulmonary sarcoidosis).  Additional reasons for visit:  Follow-up for Chest pain on exertion is described as the following: The onset of the chest pain on exertion has been gradual and has been occurring in an episodic pattern for 2 months. Each episode lasts 15 minutes. The chest pain on exertion is described as a moderate discomfort and tightness. The chest pain on exertion is described as being located in the substernal area. The chest pain on exertion radiates to the right arm. The pain is precipitated by exercise. The symptoms are aggravated by exertion. The symptoms are relieved by nitroglycerine X 1 and rest. The symptoms have been associated with diaphoresis, dyspnea and shoulder pain (right shoulder).  Follow up test results is described as the following: The patient had a cardiovascular nuclear scan and echocardiography. Current symptoms include dyspnea. Current medication use: compliant with dosing regimen and no side effects.  Problem List/Past Medical [name]; 08/03/2014 10:55 AM) Benign essential hypertension (I10) Angina pectoris (I20.9) Abnormal EKG (R94.31) Hypertensive heart disease without heart failure (I11.9) Echocardiogram 07/21/2014: Left ventricle cavity is normal in size. Severe concentric hypertrophy of the left ventricle. The LV septum and posterior wall  measure 2cm. Normal global wall motion. Doppler evidence of grade II (pseudonormal) diastolic dysfunction. Diastolic dysfunction findings suggests elevated LA/LV end diastolic pressure. Calculated EF 56%. Left atrial cavity is moderately dilated. 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 Shortness of breath on exertion (R06.02) Lexiscan myoview stress test 07/05/2014: 1. The resting electrocardiogram demonstrated normal sinus rhythm. LVH. Stress EKG is non-diagnostic for ischemia as it a pharmacologic stress using Lexiscan. tress symptoms included dyspnea. 2. The perfusion imaging study demonstrates the left ventricle to be mildly dilated both in rest and stress images with left ventricular end diastolic volume of 703 mL. There was uniform uptake of radioisotope, no evidence of ischemia. LVEF was preserved at 63%. This is a low risk study. 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 Unknown History Sister 3 Younger; No known Heart Conditions Brother 2 1-Older (Hx of Seizures) 1-Younger (Had Stroke in his early 27's)  Social History [name]; 08/03/2014 10:55 AM) Current tobacco use Former smoker. Quit in 2007 Alcohol Use Occasional alcohol use. Number of Children 3. Marital status Single. Living Situation Lives with her Mother  Past Surgical History [name]; 08/03/2014 10:55 AM) [phone] Lung [phone] Sarcoidosis Hysterectomy (not due to cancer) - Partial2007  Medication History [name]; 08/03/2014 11:14 AM) AmLODIPine Besylate (5MG  Tablet, 1 (one)  Tablet Oral daily, Taken starting 06/28/2014) Active. Amoxicillin-Pot Clavulanate (875-125MG  Tablet, 1 Oral two times daily for 10 days, Taken starting 06/21/2014) Discontinued: Completed course. Atorvastatin Calcium (20MG  Tablet, 1 Oral daily) Active. Amitriptyline HCl (  50MG  Tablet, 1 Oral daily) Active. FLUoxetine HCl (20MG  Capsule, 1 Oral daily) Active. Glycopyrrolate (1MG  Tablet, 1 Oral two times daily) Active. Loratadine (10MG  Tablet, 1 Oral daily) Active. Metoprolol Tartrate (100MG  Tablet, 1 Oral two times daily) Discontinued. (switched to Bisoprolol) Mometasone Furo-Formoterol Fum (100-5MCG/ACT Aerosol, 2 puffs Inhalation two times daily) Active. Montelukast Sodium (10MG  Tablet, 1 Oral daily) Active. Albuterol Sulfate ((2.5 MG/3ML)0.083% Nebulized Soln, Inhalation every 6 hours as needed) Active. TiZANidine HCl (4MG  Tablet, 1 Oral every 6 hours as needed) Active. Zolpidem Tartrate (10MG  Tablet, 1 Oral daily as needed) Active. PredniSONE (10MG  Tablet, 1 Oral daily) Discontinued: Completed course. Linzess (145MCG Capsule, 1 Oral daily) Active. Nitrostat (0.3MG  Tab Sublingual, 1 Sublingual as needed) Active. Fluticasone Propionate (50MCG/ACT Suspension, 2 puffs each nostril Nasal two times daily) Active. Losartan Potassium-HCTZ (100-25MG  Tablet, 1 Oral daily) Active. Naproxen (500MG  Tablet, 1 Oral two times daily) Active. NexIUM (40MG  Capsule DR, 1 Oral daily) Active. Aspirin Childrens (81MG  Tablet Chewable, 1 Oral daily) Active. Advair Diskus (250-50MCG/DOSE Aero Pow Br Act, 2 puffs Inhalation as needed, Taken starting 06/2014) Active. Bisoprolol Fumarate (10MG  Tablet, 1 Oral two times daily) Active. Medications Reconciled  Diagnostic Studies History [name]; 08-14-2014 10:55 AM) Echocardiogram07/06/2014 Left ventricle cavity is normal in size. Severe concentric hypertrophy of the left ventricle. The LV septum and posterior wall measure 2cm. Normal global  wall motion. Doppler evidence of grade II (pseudonormal) diastolic dysfunction. Diastolic dysfunction findings suggests elevated LA/LV end diastolic pressure. Calculated EF 56%. Left atrial cavity is moderately dilated. Fenestrated atrisl septal defect noted with left to right shunting by coloer Doppler. Nuclear stress test06/20/2016 1. The resting electrocardiogram demonstrated normal sinus rhythm. LVH. Stress EKG is non-diagnostic for ischemia as it a pharmacologic stress using Lexiscan. tress symptoms included dyspnea. 2. The perfusion imaging study demonstrates the left ventricle to be mildly dilated both in rest and stress images with left ventricular end diastolic volume of 161 mL. There was uniform uptake of radioisotope, no evidence of ischemia. LVEF was preserved at 63%. This is a low risk study. Colonoscopy2014 removed 9 benign polyps   Review of Systems (Bridgette Allison Rivera; August 14, 2014 11:35 AM) General Not Present- Anorexia, Fatigue and Fever. Respiratory Present- Decreased Exercise Tolerance, Difficulty Breathing on Exertion and Dyspnea. Not Present- Cough. Cardiovascular Present- Shortness of Breath. Not Present- Chest Pain, Claudications, Edema, Orthopnea and Paroxysmal Nocturnal Dyspnea. Gastrointestinal Not Present- Change in Bowel Habits, Constipation and Nausea. Neurological Not Present- Focal Neurological Symptoms. Endocrine Not Present- Appetite Changes, Cold Intolerance and Heat Intolerance. Hematology Not Present- Anemia, Petechiae and Prolonged Bleeding. All other systems negative Vitals [name]; 08/14/2014 11:16 AM) 14-Aug-2014 10:55 AM Weight: 204.56 lb Height: 67in Body Surface Area: 2.04 m Body Mass Index: 32.04 kg/m  Pulse: 88 (Regular)  P.OX: 95% (Room air) BP: 158/92 (Sitting, Left Arm, Standard)     Physical Exam (Bridgette Ebony Hail, Rivera; August 14, 2014 12:49 PM) General Mental Status-Alert. General Appearance-Cooperative, Appears  stated age, Not in acute distress. Orientation-Oriented X3. Build & Nutrition-Moderately built and Mildly obese.  Head and Neck Thyroid Gland Characteristics - no palpable nodules, no palpable enlargement.  Chest and Lung Exam Palpation Tender - No chest wall tenderness. Auscultation Breath sounds - Clear.  Cardiovascular Inspection Jugular vein - Right - No Distention. Auscultation Heart Sounds - S1 WNL, S2 WNL and No gallop present. Murmurs & Other Heart Sounds - Murmur - No murmur.  Abdomen Inspection Contour - Obese. Palpation/Percussion Normal exam - Non Tender and No hepatosplenomegaly. Auscultation Normal exam - Bowel sounds  normal.  Peripheral Vascular Lower Extremity Inspection - Left - No Pigmentation, No Varicose veins. Right - No Pigmentation, No Varicose veins. Palpation - Edema - Left - No edema. Right - No edema. Femoral pulse - Left - Normal. Right - Normal. Popliteal pulse - Left - Normal. Right - Normal. Dorsalis pedis pulse - Left - Normal. Right - Normal. Posterior tibial pulse - Left - Normal. Right - Normal. Carotid arteries - Left-No Carotid bruit. Carotid arteries - Right-No Carotid bruit. Abdomen-No prominent abdominal aortic pulsation, No epigastric bruit.  Neurologic Motor-Grossly intact without any focal deficits.  Musculoskeletal Global Assessment Left Lower Extremity - normal range of motion without pain. Right Lower Extremity - normal range of motion without pain.  Assessment & Plan (Bridgette Ebony Hail Rivera; 08/03/2014 11:40 AM) ASD (atrial septal defect) (Q21.1) Angina pectoris (I20.9) Impression: EKG 06/28/2014: Normal sinus rhythm at rate of 77 bpm, left atrial enlargement, left ventricular hypertrophy with repolarization asymptomatic, cannot exclude inferior and lateral ischemia. Shortness of breath on exertion (R06.02) Story: Lexiscan myoview stress test 07/05/2014: 1. The resting electrocardiogram demonstrated normal  sinus rhythm. LVH. Stress EKG is non-diagnostic for ischemia as it a pharmacologic stress using Lexiscan. tress symptoms included dyspnea. 2. The perfusion imaging study demonstrates the left ventricle to be mildly dilated both in rest and stress images with left ventricular end diastolic volume of 290 mL. There was uniform uptake of radioisotope, no evidence of ischemia. LVEF was preserved at 63%. This is a low risk study. Benign essential hypertension (I10) Current Plans Discontinued Losartan Potassium-HCTZ 100-25MG  (efficacy). Started Valsartan-Hydrochlorothiazide 320-25MG , 1 (one) Tablet daily, #90, 08/03/2014, No Refill. Hypertensive heart disease without heart failure (I11.9) Story: Echocardiogram 07/21/2014: Left ventricle cavity is normal in size. Severe concentric hypertrophy of the left ventricle. The LV septum and posterior wall measure 2cm. Normal global wall motion. Doppler evidence of grade II (pseudonormal) diastolic dysfunction. Diastolic dysfunction findings suggests elevated LA/LV end diastolic pressure. Calculated EF 56%. Left atrial cavity is moderately dilated. 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 I discussed primary/secondary prevention and also dietary counceling was done. She presents for follow-up after echocardiogram and nuclear stress test performed due to dyspnea and chest pain on exertion. She denies any recurrent chest pain since her last visit but states she has continued to have significant dyspnea with even very minimal activity. Stress test negative for evidence of ischemia. Echocardiogram revealed moderate to severe LVH with normal LVEF and fenestrated ASD. Will schedule for TEE for further evaluation of ASD and  to determine if closure is necessary. Blood pressure is still uncontrolled. Will change losartan to valsartan for improved efficacy. Follow-up with Dr. Einar Gip after TEE.   Signed by Abigail Labella, Rivera (08/03/2014 12:51 PM)  Patient also follows [name] and he felt that her dyspnea is clearly out of proportion to her lung issues and hence proceeding with further evaluation for cardiac etiology would be appropriate. I have personally reviewed the patient's record and performed physical exam and agree with the assessment and plan of Ms. Abigail Labella, NP-C.  Adrian Prows, MD 08/19/2014, 9:47 PM [facility]. [place] Pager: [phone] Office: [phone] If no answer: Cell:  [phone]

## 2014-08-24 ENCOUNTER — Encounter (HOSPITAL_COMMUNITY): Payer: Self-pay | Admitting: *Deleted

## 2014-08-24 ENCOUNTER — Ambulatory Visit (HOSPITAL_COMMUNITY)
Admission: RE | Admit: 2014-08-24 | Discharge: 2014-08-24 | Disposition: A | Discharge status: Home / Self Care | Payer: Medicare Other | Source: Ambulatory Visit | Attending: Cardiology | Admitting: Cardiology

## 2014-08-24 ENCOUNTER — Encounter (HOSPITAL_COMMUNITY)
Admission: RE | Disposition: A | Discharge status: Home / Self Care | Payer: Self-pay | Source: Ambulatory Visit | Attending: Cardiology

## 2014-08-24 DIAGNOSIS — I10 Essential (primary) hypertension: Secondary | ICD-10-CM | POA: Diagnosis not present

## 2014-08-24 DIAGNOSIS — Q211 Atrial septal defect: Secondary | ICD-10-CM | POA: Insufficient documentation

## 2014-08-24 HISTORY — DX: Depression, unspecified: F32.A

## 2014-08-24 HISTORY — DX: Essential (primary) hypertension: I10

## 2014-08-24 HISTORY — DX: Reserved for inherently not codable concepts without codable children: IMO0001

## 2014-08-24 HISTORY — DX: Chronic obstructive pulmonary disease, unspecified: J44.9

## 2014-08-24 HISTORY — DX: Anxiety disorder, unspecified: F41.9

## 2014-08-24 HISTORY — DX: Major depressive disorder, single episode, unspecified: F32.9

## 2014-08-24 HISTORY — PX: TEE WITHOUT CARDIOVERSION: SHX5443

## 2014-08-24 SURGERY — ECHOCARDIOGRAM, TRANSESOPHAGEAL
Anesthesia: Moderate Sedation

## 2014-08-24 MED ORDER — BUTAMBEN-TETRACAINE-BENZOCAINE 2-2-14 % EX AERO
INHALATION_SPRAY | CUTANEOUS | Status: DC | PRN
  Administered 2014-08-24: 2 via TOPICAL

## 2014-08-24 MED ORDER — SODIUM CHLORIDE 0.9 % IV SOLN
INTRAVENOUS | Status: DC
  Administered 2014-08-24: 500 mL via INTRAVENOUS

## 2014-08-24 MED ORDER — FENTANYL CITRATE (PF) 100 MCG/2ML IJ SOLN
INTRAMUSCULAR | Status: DC | PRN
  Administered 2014-08-24 (×3): 25 ug via INTRAVENOUS

## 2014-08-24 MED ORDER — MIDAZOLAM HCL 10 MG/2ML IJ SOLN
INTRAMUSCULAR | Status: DC | PRN
  Administered 2014-08-24: 2 mg via INTRAVENOUS
  Administered 2014-08-24: 1 mg via INTRAVENOUS

## 2014-08-24 MED ORDER — DIPHENHYDRAMINE HCL 50 MG/ML IJ SOLN
INTRAMUSCULAR | Status: AC
  Filled 2014-08-24: qty 1

## 2014-08-24 MED ORDER — FENTANYL CITRATE (PF) 100 MCG/2ML IJ SOLN
INTRAMUSCULAR | Status: AC
  Filled 2014-08-24: qty 2

## 2014-08-24 MED ORDER — MIDAZOLAM HCL 5 MG/ML IJ SOLN
INTRAMUSCULAR | Status: AC
  Filled 2014-08-24: qty 2

## 2014-08-24 NOTE — Interval H&P Note (Signed)
History and Physical Interval Note:  08/24/2014 10:26 AM  [name]  has presented today for surgery, with the diagnosis of ASD  The various methods of treatment have been discussed with the patient and family. After consideration of risks, benefits and other options for treatment, the patient has consented to  Procedure(s): TRANSESOPHAGEAL ECHOCARDIOGRAM (TEE) (N/A) as a surgical intervention .  The patient's history has been reviewed, patient examined, no change in status, stable for surgery.  I have reviewed the patient's chart and labs.  Questions were answered to the patient's satisfaction.   Patient consents to having students observing: Avanell Shackleton, Ulice Dash

## 2014-08-24 NOTE — Discharge Instructions (Signed)
Transesophageal Echocardiogram °Transesophageal echocardiography (TEE) is a picture test of your heart using sound waves. The pictures taken can give very detailed pictures of your heart. This can help your doctor see if there are problems with your heart. TEE can check: °· If your heart has blood clots in it. °· How well your heart valves are working. °· If you have an infection on the inside of your heart. °· Some of the major arteries of your heart. °· If your heart valve is working after a repair. °· Your heart before a procedure that uses a shock to your heart to get the rhythm back to normal. °BEFORE THE PROCEDURE °· Do not eat or drink for 6 hours before the procedure or as told by your doctor. °· Make plans to have someone drive you home after the procedure. Do not drive yourself home. °· An IV tube will be put in your arm. °PROCEDURE °· You will be given a medicine to help you relax (sedative). It will be given through the IV tube. °· A numbing medicine will be sprayed or gargled in the back of your throat to help numb it. °· The tip of the probe is placed into the back of your mouth. You will be asked to swallow. This helps to pass the probe into your esophagus. °· Once the tip of the probe is in the right place, your doctor can take pictures of your heart. °· You may feel pressure at the back of your throat. °AFTER THE PROCEDURE °· You will be taken to a recovery area so the sedative can wear off. °· Your throat may be sore and scratchy. This will go away slowly over time. °· You will go home when you are fully awake and able to swallow liquids. °· You should have someone stay with you for the next 24 hours. °· Do not drive or operate machinery for the next 24 hours. °Document Released: 10/29/2008 Document Revised: 01/06/2013 Document Reviewed: 07/03/2012 °ExitCare® Patient Information ©2015 [employer]. This information is not intended to replace advice given to you by your health care provider. Make  sure you discuss any questions you have with your health care provider. ° °

## 2014-08-24 NOTE — CV Procedure (Signed)
TEE: Under moderate sedation, TEE was performed without complications: LV: Mild LVHl. Normal EF.  RV: Normal LA: Normal. Left atrial appendage: Normal without thrombus. Normal function. Inter atrial septum shows very small fenestrated defects with color Doppler e/o bidirectional shunting. Very small defects (2).  Double contrast study negative for atrial level shunting.  Minimal bubbles seen late (2-3) RA: Normal  MV: Normal Trace MR. TV: Normal Trace TR AV: Normal. No AI or AS. PV: Normal. Trace PI.  Thoracic and ascending aorta: Normal without significant plaque or atheromatous changes.

## 2014-08-25 ENCOUNTER — Ambulatory Visit: Payer: Medicaid Other | Admitting: Cardiology

## 2014-08-26 ENCOUNTER — Encounter (HOSPITAL_COMMUNITY): Payer: Self-pay | Admitting: Cardiology

## 2014-09-06 ENCOUNTER — Encounter: Payer: Self-pay | Admitting: Internal Medicine

## 2014-09-06 ENCOUNTER — Ambulatory Visit (INDEPENDENT_AMBULATORY_CARE_PROVIDER_SITE_OTHER): Payer: Medicare Other | Admitting: Internal Medicine

## 2014-09-06 VITALS — BP 142/84 | HR 107 | Ht 67.0 in | Wt 206.0 lb

## 2014-09-06 DIAGNOSIS — E669 Obesity, unspecified: Secondary | ICD-10-CM

## 2014-09-06 DIAGNOSIS — I10 Essential (primary) hypertension: Secondary | ICD-10-CM

## 2014-09-06 DIAGNOSIS — J449 Chronic obstructive pulmonary disease, unspecified: Secondary | ICD-10-CM | POA: Diagnosis not present

## 2014-09-06 NOTE — Progress Notes (Signed)
Subjective:    [name], female    DOB: Jan 29, 1955  MRN: [id number]    Brief patient profile:  16 yobf quit smoking 2010 with onset doe 2008 and when quit smoking wt around 170-180 with breathing gradually worse since quit smoking referred to pulmonary clinic 06/25/14 by Dr Rickey Barbara with nl pfts 08/09/14 x for erv 51% while on symbicort    History of Present Illness  06/25/2014 1st [facility] office visit/ [name]   Chief Complaint  Patient presents with  . Pulmonary Consult    Referred by Dr. Justice Britain. Pt c/o SOB- started a few months ago. She is concerned about mold in her home causing her breathing problem. She gets SOB walking up an incline. She is using proair 2-3 x per day and neb 4-5 x per day.   gradually worse doe  x 1 year, better with inhaler and neb but not advair with raspy upper airway coughing fits more day than noct assoc with overt HB >Stop advair Start dulera 100 Take 2 puffs first thing in am and then another 2 puffs about 12 hours later.  Only use your albuterol (proair) Only use nebulizer if you first try proair and it doesn't work  Nexium 40 mg   Take  30-60 min before first meal of the day and Pepcid (famotidine)  20 mg one @  bedtime until return to office - this is the best way to tell whether stomach acid is contributing to your problem.     07/09/2014 follow-up and medication review/ NP Patient returns for a two-week follow-up and medication review. He was seen last visit for a pulmonary consult for shortness of breath and possible COPD. She was changed from Advair to [facility].  Pt c/o dry cough, occasional wheezing. Pt states overall breathing has improved.  denies any hemoptysis, chest pain, orthopnea, PND, or increased leg swelling. Patient has not had PFTs. rec Stop Metoprolol .  Begin Bisoprolol 10mg  .Twice daily   Chest xray today .  Follow med calendar closely and bring to each visit.  follow up Dr.  Melvyn Novas  In 3 weeks with PFT    08/09/2014 f/u ov/[name] re:  Unexplained sob/ no obst on pfts p am symbicort   Chief Complaint  Patient presents with  . Follow-up    PFT done today. Pt right arm pain radiating to her chest for the past wk. She states that her breathing has improved some. She is only using rescue inhaler 2 x per wk on average.   Flat surface ok/ no long distances like a mall but as long as takes her time she does fine s cp with exertion rec Stop symbicort and dulera for now  Only use your albuterol as a rescue medication to be used if you can't catch your breath   Nexium should be Take 30-60 min before first meal of the day and Pepcid 20 mg at bedtime until you return  GERD diet      09/06/2014 f/u ov/[name] re: obesity/ doe  Chief Complaint  Patient presents with  . Follow-up    pt c/o sob with exertion.  no other complaints.     only sob with steps/ not walking flat - no change off maint inhalers   Taking nexium ac and pepcid at hs -  Only used inhaler once and hfa poor at baseline on singulair daily   No obvious day to day or daytime variability or assoc chronic cough  or cp or chest tightness, subjective wheeze or overt sinus or hb symptoms. No unusual exp hx or h/o childhood pna/ asthma or knowledge of premature birth.  Sleeping ok without nocturnal  or early am exacerbation  of respiratory  c/o's or need for noct saba. Also denies any obvious fluctuation of symptoms with weather or environmental changes or other aggravating or alleviating factors except as outlined above   Current Medications, Allergies, Complete Past Medical History, Past Surgical History, Family History, and Social History were reviewed in [employer] record.  ROS  The following are not active complaints unless bolded sore throat, dysphagia, dental problems, itching, sneezing,  nasal congestion or excess/ purulent secretions, ear ache,   fever, chills, sweats, unintended wt loss,  classically pleuritic or exertional cp, hemoptysis,  orthopnea pnd or leg swelling, presyncope, palpitations, abdominal pain, anorexia, nausea, vomiting, diarrhea  or change in bowel or bladder habits, change in stools or urine, dysuria,hematuria,  rash, arthralgias, visual complaints, headache, numbness, weakness or ataxia or problems with walking or coordination,  change in mood/affect or memory.                Objective:   Physical Exam  amb still hoarse bf nad   09/06/2014       206 Wt Readings from Last 3 Encounters:  08/09/14 205 lb (92.987 kg)  07/09/14 200 lb (90.719 kg)  06/25/14 202 lb 12.8 oz (91.989 kg)    Vital signs reviewed    HEENT: edentulous  with nl turbinates, and orophanx. Nl external ear canals without cough reflex   NECK :  without JVD/Nodes/TM/ nl carotid upstrokes bilaterally   LUNGS: no acc muscle use, clear to A and P bilaterally without cough on insp or exp maneuvers   CV:  RRR  no s3 or murmur or increase in P2, no edema   ABD:  soft and nontender with nl excursion in the supine position. No bruits or organomegaly, bowel sounds nl  MS:  warm without deformities, calf tenderness, cyanosis or clubbing  SKIN: warm and dry without lesions    NEURO:  alert, approp, no deficits    I personally reviewed images and agree with radiology impression as follows:  CXR:  07/09/14  No active cardiopulmonary disease.      Assessment & Plan:   Outpatient Encounter Prescriptions as of 09/06/2014  Medication Sig  . albuterol (PROAIR HFA) 108 (90 BASE) MCG/ACT inhaler Inhale 2 puffs into the lungs every 4 (four) hours as needed for wheezing or shortness of breath (((PLAN B))).   Marland Kitchen amitriptyline (ELAVIL) 50 MG tablet 2 tabs by mouth at bedtime  . amLODipine (NORVASC) 10 MG tablet Take 10 mg by mouth daily.  Marland Kitchen aspirin 81 MG tablet Take 81 mg by mouth daily.  Marland Kitchen atorvastatin (LIPITOR) 20 MG tablet Take 20 mg by mouth daily at 6 PM.  . bisoprolol (ZEBETA)  10 MG tablet Take 1 tablet (10 mg total) by mouth 2 (two) times daily.  Marland Kitchen dicyclomine (BENTYL) 10 MG capsule Take 10 mg by mouth 2 (two) times daily.  Marland Kitchen esomeprazole (NEXIUM) 40 MG capsule Take 40 mg by mouth daily at 12 noon.  Marland Kitchen FLUoxetine (PROZAC) 20 MG tablet Take 20 mg by mouth daily.  . fluticasone (FLONASE) 50 MCG/ACT nasal spray Place 2 sprays into both nostrils daily as needed for allergies or rhinitis.  . Linaclotide (LINZESS) 145 MCG CAPS capsule Take 145 mcg by mouth daily.  Marland Kitchen loratadine (CLARITIN) 10 MG  tablet Take 10 mg by mouth daily.  . montelukast (SINGULAIR) 10 MG tablet Take 10 mg by mouth at bedtime.  . naproxen (NAPROSYN) 500 MG tablet Take 500 mg by mouth 2 (two) times daily as needed.  . nitroGLYCERIN (NITROSTAT) 0.3 MG SL tablet Place 0.3 mg under the tongue every 5 (five) minutes as needed for chest pain (may repeat x3).  Marland Kitchen tiZANidine (ZANAFLEX) 4 MG tablet Take 4 mg by mouth at bedtime as needed for muscle spasms.   . valsartan-hydrochlorothiazide (DIOVAN-HCT) 320-25 MG per tablet Take 1 tablet by mouth daily.  Marland Kitchen zolpidem (AMBIEN) 10 MG tablet Take 10 mg by mouth at bedtime as needed for sleep.  . [DISCONTINUED] albuterol (PROVENTIL) (2.5 MG/3ML) 0.083% nebulizer solution Take 2.5 mg by nebulization every 4 (four) hours as needed for wheezing or shortness of breath (((PLAN C))).   . [DISCONTINUED] mometasone-formoterol (DULERA) 100-5 MCG/ACT AERO Inhale 2 puffs into the lungs 2 (two) times daily as needed for wheezing.  . [DISCONTINUED] budesonide-formoterol (SYMBICORT) 160-4.5 MCG/ACT inhaler Inhale 2 puffs into the lungs 2 (two) times daily. (Patient not taking: Reported on 09/06/2014)   No facility-administered encounter medications on file as of 09/06/2014.

## 2014-09-06 NOTE — Assessment & Plan Note (Signed)
Body mass index is 32.26 kg/(m^2).  No results found for: TSH   Contributing to gerd tendency/ doe/reviewed need  achieve and maintain neg calorie balance > defer f/u primary care including intermittently monitoring thyroid status

## 2014-09-06 NOTE — Assessment & Plan Note (Signed)
-  quit smoking 2010  - changed advair to dulera 100 06/26/2014  - PFts 08/09/14 without obst p am symbiocrt >rec try off maint rx  -  09/06/2014 p extensive coaching HFA effectiveness =  90% p coaching   > just use saba prn   She could still have mild asthma and gerd related airways symptoms but even if she has asthma All goals of chronic asthma control met including optimal function and elimination of symptoms with minimal need for rescue therapy.  Contingencies discussed in full including contacting this office immediately if not controlling the symptoms using the rule of two's.   I had an extended final summary discussion with the patient reviewing all relevant studies completed to date and  lasting 15 to 20 minutes of a 25 minute visit on the following issues:    1) Each maintenance medication was reviewed in detail including most importantly the difference between maintenance and as needed and under what circumstances the prns are to be used.  Please see instructions for details which were reviewed in writing and the patient given a copy.   2) pulmonary f/u prn

## 2014-09-06 NOTE — Assessment & Plan Note (Signed)
Follow up per Primary Care planned  / wt loss would help / very easily confused with care so best plan is to keep it simple

## 2014-09-06 NOTE — Patient Instructions (Signed)
Weight control is simply a matter of calorie balance which needs to be tilted in your favor by eating less and exercising more.  To get the most out of exercise, you need to be continuously aware that you are short of breath, but never out of breath, for 30 minutes daily. As you improve, it will actually be easier for you to do the same amount of exercise  in  30 minutes so always push to the level where you are short of breath.  If this does not result in gradual weight reduction then I strongly recommend you see a nutritionist with a food diary x 2 weeks so that we can work out a negative calorie balance which is universally effective in steady weight loss programs.  Think of your calorie balance like you do your bank account where in this case you want the balance to go down so you must take in less calories than you burn up.  It's just that simple:  Hard to do, but easy to understand.  Good luck!   Only use your albuterol (proair = RED) as a rescue medication to be used if you can't catch your breath by resting or doing a relaxed purse lip breathing pattern.  - The less you use it, the better it will work when you need it. - Ok to use up to 2 puffs  every 4 hours if you must but call for immediate appointment if use goes up over your usual need - Don't leave home without it !!  (think of it like the spare tire for your car)     If you are satisfied with your treatment plan,  let your doctor know and he/she can either refill your medications or you can return here when your prescription runs out.     If in any way you are not 100% satisfied,  please tell us.  If 100% better, tell your friends!  Pulmonary follow up is as needed

## 2014-10-01 ENCOUNTER — Other Ambulatory Visit: Payer: Self-pay | Admitting: Adult Health

## 2014-11-09 ENCOUNTER — Other Ambulatory Visit: Payer: Self-pay | Admitting: Adult Health

## 2014-11-09 NOTE — Telephone Encounter (Signed)
Defer to PCP

## 2014-11-20 ENCOUNTER — Emergency Department (HOSPITAL_COMMUNITY): Payer: Medicare Other

## 2014-11-20 ENCOUNTER — Encounter (HOSPITAL_COMMUNITY): Payer: Self-pay

## 2014-11-20 ENCOUNTER — Emergency Department (HOSPITAL_COMMUNITY)
Admission: EM | Admit: 2014-11-20 | Discharge: 2014-11-20 | Disposition: A | Discharge status: Home / Self Care | Payer: Medicare Other | Attending: Emergency Medicine | Admitting: Emergency Medicine

## 2014-11-20 DIAGNOSIS — Z8701 Personal history of pneumonia (recurrent): Secondary | ICD-10-CM | POA: Insufficient documentation

## 2014-11-20 DIAGNOSIS — F329 Major depressive disorder, single episode, unspecified: Secondary | ICD-10-CM | POA: Insufficient documentation

## 2014-11-20 DIAGNOSIS — J069 Acute upper respiratory infection, unspecified: Secondary | ICD-10-CM | POA: Diagnosis not present

## 2014-11-20 DIAGNOSIS — Z87891 Personal history of nicotine dependence: Secondary | ICD-10-CM | POA: Diagnosis not present

## 2014-11-20 DIAGNOSIS — Z791 Long term (current) use of non-steroidal anti-inflammatories (NSAID): Secondary | ICD-10-CM | POA: Insufficient documentation

## 2014-11-20 DIAGNOSIS — F419 Anxiety disorder, unspecified: Secondary | ICD-10-CM | POA: Diagnosis not present

## 2014-11-20 DIAGNOSIS — Z79899 Other long term (current) drug therapy: Secondary | ICD-10-CM | POA: Diagnosis not present

## 2014-11-20 DIAGNOSIS — J449 Chronic obstructive pulmonary disease, unspecified: Secondary | ICD-10-CM | POA: Insufficient documentation

## 2014-11-20 DIAGNOSIS — J189 Pneumonia, unspecified organism: Secondary | ICD-10-CM | POA: Insufficient documentation

## 2014-11-20 DIAGNOSIS — I10 Essential (primary) hypertension: Secondary | ICD-10-CM | POA: Insufficient documentation

## 2014-11-20 DIAGNOSIS — R0602 Shortness of breath: Secondary | ICD-10-CM | POA: Diagnosis present

## 2014-11-20 DIAGNOSIS — Z7982 Long term (current) use of aspirin: Secondary | ICD-10-CM | POA: Insufficient documentation

## 2014-11-20 HISTORY — DX: Pneumonia, unspecified organism: J18.9

## 2014-11-20 HISTORY — DX: Chest pain, unspecified: R07.9

## 2014-11-20 LAB — BASIC METABOLIC PANEL
Anion gap: 8 (ref 5–15)
BUN: 10 mg/dL (ref 6–20)
CALCIUM: 10.2 mg/dL (ref 8.9–10.3)
CHLORIDE: 104 mmol/L (ref 101–111)
CO2: 25 mmol/L (ref 22–32)
CREATININE: 0.79 mg/dL (ref 0.44–1.00)
GFR calc Af Amer: >60 mL/min (ref >60)
GFR calc non Af Amer: >60 mL/min (ref >60)
GLUCOSE: 160 mg/dL — AB (ref 65–99)
Potassium: 3.7 mmol/L (ref 3.5–5.1)
Sodium: 137 mmol/L (ref 135–145)

## 2014-11-20 LAB — CBC
HCT: 40.9 % (ref 36.0–46.0)
Hemoglobin: 13.4 g/dL (ref 12.0–15.0)
MCH: 30.2 pg (ref 26.0–34.0)
MCHC: 32.8 g/dL (ref 30.0–36.0)
MCV: 92.1 fL (ref 78.0–100.0)
PLATELETS: 224 10*3/uL (ref 150–400)
RBC: 4.44 MIL/uL (ref 3.87–5.11)
RDW: 13.4 % (ref 11.5–15.5)
WBC: 7.1 10*3/uL (ref 4.0–10.5)

## 2014-11-20 LAB — I-STAT TROPONIN, ED: TROPONIN I, POC: 0.04 ng/mL (ref 0.00–0.08)

## 2014-11-20 MED ORDER — ALBUTEROL SULFATE (2.5 MG/3ML) 0.083% IN NEBU
2.5000 mg | INHALATION_SOLUTION | Freq: Once | RESPIRATORY_TRACT | Status: AC
  Administered 2014-11-20: 2.5 mg via RESPIRATORY_TRACT
  Filled 2014-11-20: qty 3

## 2014-11-20 MED ORDER — HYDROCOD POLST-CPM POLST ER 10-8 MG/5ML PO SUER: 5.0000 mL | Freq: Two times a day (BID) | ORAL | Status: DC | PRN

## 2014-11-20 MED ORDER — ALBUTEROL SULFATE (2.5 MG/3ML) 0.083% IN NEBU: 2.5000 mg | INHALATION_SOLUTION | Freq: Four times a day (QID) | RESPIRATORY_TRACT | Status: DC | PRN

## 2014-11-20 MED ORDER — IPRATROPIUM-ALBUTEROL 0.5-2.5 (3) MG/3ML IN SOLN
3.0000 mL | Freq: Once | RESPIRATORY_TRACT | Status: AC
  Administered 2014-11-20: 3 mL via RESPIRATORY_TRACT
  Filled 2014-11-20: qty 3

## 2014-11-20 MED ORDER — AZITHROMYCIN 250 MG PO TABS: ORAL_TABLET | ORAL | Status: DC

## 2014-11-20 MED ORDER — PREDNISONE 50 MG PO TABS: ORAL_TABLET | ORAL | Status: DC

## 2014-11-20 NOTE — ED Provider Notes (Signed)
CSN: [id number]     Arrival date & time 11/20/14  1623 History   First MD Initiated Contact with Patient 11/20/14 1712     Chief Complaint  Patient presents with  . Shortness of Breath  . Chest Pain     (Consider location/radiation/quality/duration/timing/severity/associated sxs/prior Treatment) HPI..... Cough, congestion for several weeks. No fever, sweats, chills. Patient has sarcoidosis and has been evaluated by local pulmonologist. Yellow brown productive sputum. No antibiotics recently. She has been on prednisone several times in the past but does not like the side effects. Severity of symptoms is mild to moderate. Nothing makes symptoms better or worse.  Past Medical History  Diagnosis Date  . COPD (chronic obstructive pulmonary disease) ([place])   . Shortness of breath dyspnea   . Hypertension   . Anxiety   . Depression   . Pneumonia   . Chest pain    Past Surgical History  Procedure Laterality Date  . Partial hysterectomy  2007  . Tee without cardioversion N/A 08/24/2014    Procedure: TRANSESOPHAGEAL ECHOCARDIOGRAM (TEE);  Surgeon: Adrian Prows, MD;  Location: [facility] ENDOSCOPY;  Service: Cardiovascular;  Laterality: N/A;   Family History  Problem Relation Age of Onset  . Allergies Mother   . Allergies Daughter   . Asthma Son     had as a child   Social History  Substance Use Topics  . Smoking status: Former Smoker -- 1.00 packs/day for 38 years    Types: Cigarettes    Quit date: 01/16/2008  . Smokeless tobacco: Never Used  . Alcohol Use: No   OB History    No data available     Review of Systems  All other systems reviewed and are negative.     Allergies  Flonase  Home Medications   Prior to Admission medications   Medication Sig Start Date End Date Taking? Authorizing Provider  amitriptyline (ELAVIL) 50 MG tablet TAKE 2 TABLETS BY MOUTH EVERY NIGHT AT BEDTIME 10/01/14  Yes Tammy S Parrett, NP  amLODipine (NORVASC) 10 MG tablet Take 10 mg by mouth daily.   Yes  Historical Provider, MD  aspirin 81 MG tablet Take 81 mg by mouth daily.   Yes Historical Provider, MD  atorvastatin (LIPITOR) 20 MG tablet Take 20 mg by mouth daily at 6 PM.   Yes Historical Provider, MD  bisoprolol (ZEBETA) 10 MG tablet Take 1 tablet (10 mg total) by mouth 2 (two) times daily. 07/09/14  Yes Tammy S Parrett, NP  Camphor-Eucalyptus-Menthol (VICKS VAPORUB EX) Apply 1 application topically 2 (two) times daily as needed (breathe and soreness).   Yes Historical Provider, MD  dextromethorphan-guaiFENesin (TUSSIN DM) 10-100 MG/5ML liquid Take 30 mLs by mouth every 4 (four) hours as needed for cough.   Yes Historical Provider, MD  dicyclomine (BENTYL) 10 MG capsule TAKE 1 CAPSULE BY MOUTH 2 TIMES A DAY 10/01/14  Yes Tammy S Parrett, NP  esomeprazole (NEXIUM) 40 MG capsule TAKE 1 CAPSULE BY MOUTH DAILY 10/01/14  Yes Tammy S Parrett, NP  FLUoxetine (PROZAC) 20 MG capsule TAKE 1 CAPSULE BY MOUTH EVERY DAY 10/01/14  Yes Tammy S Parrett, NP  Linaclotide (LINZESS) 145 MCG CAPS capsule Take 145 mcg by mouth daily.   Yes Historical Provider, MD  loratadine (CLARITIN) 10 MG tablet Take 10 mg by mouth daily.   Yes Historical Provider, MD  meloxicam (MOBIC) 15 MG tablet Take 15 mg by mouth daily. 11/05/14  Yes Historical Provider, MD  montelukast (SINGULAIR) 10 MG tablet Take 10  mg by mouth at bedtime.   Yes Historical Provider, MD  nitroGLYCERIN (NITROSTAT) 0.3 MG SL tablet Place 0.3 mg under the tongue every 5 (five) minutes as needed for chest pain (may repeat x3).   Yes Historical Provider, MD  phenylephrine (SUDAFED PE) 10 MG TABS tablet Take 10 mg by mouth every 4 (four) hours as needed (cough phlegm).   Yes Historical Provider, MD  tiZANidine (ZANAFLEX) 4 MG tablet Take 4 mg by mouth at bedtime as needed for muscle spasms.    Yes Historical Provider, MD  valsartan-hydrochlorothiazide (DIOVAN-HCT) 320-25 MG per tablet Take 1 tablet by mouth daily.   Yes Historical Provider, MD  zolpidem (AMBIEN) 10  MG tablet Take 10 mg by mouth at bedtime as needed for sleep.   Yes Historical Provider, MD  albuterol (PROVENTIL) (2.5 MG/3ML) 0.083% nebulizer solution Take 3 mLs (2.5 mg total) by nebulization every 6 (six) hours as needed for wheezing or shortness of breath. 11/20/14   Nat Christen, MD  azithromycin (ZITHROMAX Z-PAK) 250 MG tablet As directed 11/20/14   Nat Christen, MD  chlorpheniramine-HYDROcodone [facility]) 10-8 MG/5ML SUER Take 5 mLs by mouth every 12 (twelve) hours as needed for cough. 11/20/14   Nat Christen, MD  predniSONE (DELTASONE) 50 MG tablet One half tablet daily for 10 days 11/20/14   Nat Christen, MD   BP 114/62 mmHg  Pulse 89  Temp(Src) 98.2 F (36.8 C) (Oral)  Resp 20  SpO2 96% Physical Exam  Constitutional: She is oriented to person, place, and time.  Coughing but no respiratory distress.  HENT:  Head: Normocephalic and atraumatic.  Eyes: Conjunctivae and EOM are normal. Pupils are equal, round, and reactive to light.  Neck: Normal range of motion. Neck supple.  Cardiovascular: Normal rate and regular rhythm.   Pulmonary/Chest: Effort normal and breath sounds normal.  Abdominal: Soft. Bowel sounds are normal.  Musculoskeletal: Normal range of motion.  Neurological: She is alert and oriented to person, place, and time.  Skin: Skin is warm and dry.  Psychiatric: She has a normal mood and affect. Her behavior is normal.  Nursing note and vitals reviewed.   ED Course  Procedures (including critical care time) Labs Review Labs Reviewed  BASIC METABOLIC PANEL - Abnormal; Notable for the following:    Glucose, Bld 160 (*)    All other components within normal limits  CBC  I-STAT TROPOININ, ED    Imaging Review Dg Chest 2 View  11/20/2014  CLINICAL DATA:  Middle lower chest pain and cough for 1 week. EXAM: CHEST  2 VIEW COMPARISON:  07/09/2014 FINDINGS: The heart size and mediastinal contours are within normal limits. Both lungs are clear. The visualized  skeletal structures are unremarkable. IMPRESSION: No active cardiopulmonary disease. Electronically Signed   By: Kathreen Devoid   On: 11/20/2014 17:17   I have personally reviewed and evaluated these images and lab results as part of my medical decision-making.   EKG Interpretation   Date/Time:  Saturday November 20 2014 16:45:25 EDT Ventricular Rate:  87 PR Interval:  192 QRS Duration: 106 QT Interval:  448 QTC Calculation: 539 R Axis:   24 Text Interpretation:  6Sinus rhythm Probable left atrial enlargement LVH  with secondary repolarization abnormality Anterior ST elevation, probably  due to LVH Prolonged QT interval Confirmed by Lacinda Axon  MD, Hutch Rhett ([id number]) on  11/20/2014 6:21:26 PM      MDM   Final diagnoses:  URI (upper respiratory infection)    Patient feels  better after albuterol/Atrovent nebulizer treatment. Discharge medications Zithromax, albuterol solution, prednisone, Tussionex suspension. Chest x-ray negative for pneumonia.    Nat Christen, MD 11/20/14 [phone]

## 2014-11-20 NOTE — ED Notes (Signed)
She states she has had cough/congestion "for a while now" (at least a month); with occasional central chest discomfort and arm discomfort.  She states She has been dx with pneumonia and has completed her antibiotic therapy "but I'm still short of breath and coughing".

## 2014-11-20 NOTE — Discharge Instructions (Signed)
Chest x-ray showed no pneumonia. Prescription for cough syrup, Zithromax, prednisone, albuterol solution. Follow-up your primary care doctor.

## 2014-11-20 NOTE — ED Notes (Signed)
Patient is being discharged home. Patient is ambulatory and discharge instructions were given to patient and family.

## 2014-12-01 ENCOUNTER — Other Ambulatory Visit: Payer: Self-pay | Admitting: Adult Health

## 2015-01-27 ENCOUNTER — Telehealth: Payer: Self-pay | Admitting: Internal Medicine

## 2015-01-27 ENCOUNTER — Other Ambulatory Visit: Payer: Self-pay | Admitting: Adult Health

## 2015-01-27 NOTE — Telephone Encounter (Signed)
Pt last seen 09/06/14 and was told follow up with pulmonary PRN --  Called spoke with [facility]. Pt needs refill on amitriptyline and bentyl. I advised pt needs to get these refill from PCP since she is now longer being seen in office.  Nothing further needed

## 2015-01-31 ENCOUNTER — Other Ambulatory Visit: Payer: Self-pay | Admitting: Adult Health

## 2015-02-03 ENCOUNTER — Other Ambulatory Visit (HOSPITAL_COMMUNITY): Payer: Self-pay | Admitting: Specialist

## 2015-02-03 ENCOUNTER — Ambulatory Visit (HOSPITAL_COMMUNITY)
Admission: RE | Admit: 2015-02-03 | Discharge: 2015-02-03 | Disposition: A | Discharge status: Home / Self Care | Payer: Medicare Other | Source: Ambulatory Visit | Attending: Specialist | Admitting: Specialist

## 2015-02-03 ENCOUNTER — Encounter (HOSPITAL_COMMUNITY): Payer: Self-pay

## 2015-02-03 DIAGNOSIS — R079 Chest pain, unspecified: Secondary | ICD-10-CM

## 2015-02-03 DIAGNOSIS — D3502 Benign neoplasm of left adrenal gland: Secondary | ICD-10-CM | POA: Diagnosis not present

## 2015-02-03 DIAGNOSIS — R9431 Abnormal electrocardiogram [ECG] [EKG]: Secondary | ICD-10-CM

## 2015-02-03 DIAGNOSIS — J9811 Atelectasis: Secondary | ICD-10-CM | POA: Diagnosis not present

## 2015-02-03 DIAGNOSIS — J439 Emphysema, unspecified: Secondary | ICD-10-CM | POA: Insufficient documentation

## 2015-02-03 DIAGNOSIS — I2510 Atherosclerotic heart disease of native coronary artery without angina pectoris: Secondary | ICD-10-CM | POA: Diagnosis not present

## 2015-02-03 DIAGNOSIS — R0602 Shortness of breath: Secondary | ICD-10-CM

## 2015-02-03 DIAGNOSIS — I517 Cardiomegaly: Secondary | ICD-10-CM | POA: Insufficient documentation

## 2015-02-03 DIAGNOSIS — J189 Pneumonia, unspecified organism: Secondary | ICD-10-CM | POA: Diagnosis not present

## 2015-02-03 HISTORY — DX: Unspecified asthma, uncomplicated: J45.909

## 2015-02-03 MED ORDER — IOHEXOL 350 MG/ML SOLN
100.0000 mL | Freq: Once | INTRAVENOUS | Status: AC | PRN
  Administered 2015-02-03: 100 mL via INTRAVENOUS

## 2015-02-04 ENCOUNTER — Ambulatory Visit (HOSPITAL_COMMUNITY): Payer: Medicare Other

## 2015-03-23 ENCOUNTER — Telehealth: Payer: Self-pay | Admitting: Internal Medicine

## 2015-03-23 NOTE — Telephone Encounter (Signed)
Per 09/06/14 OV: Patient Instructions       Weight control is simply a matter of calorie balance which needs to be tilted in your favor by eating less and exercising more.  To get the most out of exercise, you need to be continuously aware that you are short of breath, but never out of breath, for 30 minutes daily. As you improve, it will actually be easier for you to do the same amount of exercise  in  30 minutes so always push to the level where you are short of breath.  If this does not result in gradual weight reduction then I strongly recommend you see a nutritionist with a food diary x 2 weeks so that we can work out a negative calorie balance which is universally effective in steady weight loss programs. Think of your calorie balance like you do your bank account where in this case you want the balance to go down so you must take in less calories than you burn up.  It's just that simple:  Hard to do, but easy to understand.  Good luck!  Only use your albuterol (proair = RED) as a rescue medication to be used if you can't catch your breath by resting or doing a relaxed purse lip breathing pattern.   - The less you use it, the better it will work when you need it. - Ok to use up to 2 puffs  every 4 hours if you must but call for immediate appointment if use goes up over your usual need - Don't leave home without it !!  (think of it like the spare tire for your car)   If you are satisfied with your treatment plan,  let your doctor know and he/she can either refill your medications or you can return here when your prescription runs out.    If in any way you are not 100% satisfied,  please tell us.  If 100% better, tell your friends! Pulmonary follow up is as needed     --  Pt has no pending appt and was told to f/u PRN Refills needs to go to PCP. I called spoke with Tiffany from pharmacy and made aware these need to come from PCP. She reports they have [place] as pt PCP. I advised we are her  pulmonologist's. She will contact pt. Nothing further needed

## 2015-03-25 ENCOUNTER — Other Ambulatory Visit: Payer: Self-pay | Admitting: Adult Health

## 2016-04-26 ENCOUNTER — Encounter: Payer: Self-pay | Admitting: Specialist

## 2020-07-20 LAB — COLOGUARD
COLOGUARD: NEGATIVE
Cologuard: NEGATIVE

## 2020-09-27 ENCOUNTER — Ambulatory Visit (INDEPENDENT_AMBULATORY_CARE_PROVIDER_SITE_OTHER): Payer: Medicare Other | Admitting: Primary Care

## 2020-09-27 ENCOUNTER — Encounter (INDEPENDENT_AMBULATORY_CARE_PROVIDER_SITE_OTHER): Payer: Self-pay | Admitting: Primary Care

## 2020-09-27 ENCOUNTER — Other Ambulatory Visit: Payer: Self-pay

## 2020-09-27 VITALS — BP 125/78 | HR 67 | Temp 97.5°F | Ht 67.0 in | Wt 229.4 lb

## 2020-09-27 DIAGNOSIS — F32A Depression, unspecified: Secondary | ICD-10-CM

## 2020-09-27 DIAGNOSIS — I10 Essential (primary) hypertension: Secondary | ICD-10-CM | POA: Diagnosis not present

## 2020-09-27 DIAGNOSIS — J449 Chronic obstructive pulmonary disease, unspecified: Secondary | ICD-10-CM

## 2020-09-27 DIAGNOSIS — Z760 Encounter for issue of repeat prescription: Secondary | ICD-10-CM

## 2020-09-27 DIAGNOSIS — Z6835 Body mass index (BMI) 35.0-35.9, adult: Secondary | ICD-10-CM

## 2020-09-27 DIAGNOSIS — E782 Mixed hyperlipidemia: Secondary | ICD-10-CM | POA: Diagnosis not present

## 2020-09-27 DIAGNOSIS — R0602 Shortness of breath: Secondary | ICD-10-CM | POA: Diagnosis not present

## 2020-09-27 MED ORDER — TRAZODONE HCL 50 MG PO TABS
50.0000 mg | ORAL_TABLET | Freq: Every evening | ORAL | 1 refills | Status: DC | PRN
Start: 2020-09-27 — End: 2021-05-22

## 2020-09-27 MED ORDER — CARVEDILOL 12.5 MG PO TABS: 12.5000 mg | ORAL_TABLET | Freq: Two times a day (BID) | ORAL | 1 refills | Status: DC

## 2020-09-27 MED ORDER — FLUOXETINE HCL 40 MG PO CAPS: 40.0000 mg | ORAL_CAPSULE | Freq: Every day | ORAL | 1 refills | Status: DC

## 2020-09-27 MED ORDER — MONTELUKAST SODIUM 10 MG PO TABS: 10.0000 mg | ORAL_TABLET | Freq: Every day | ORAL | 1 refills | Status: DC

## 2020-09-27 MED ORDER — LISINOPRIL-HYDROCHLOROTHIAZIDE 20-12.5 MG PO TABS: 1.0000 | ORAL_TABLET | Freq: Every day | ORAL | 1 refills | Status: DC

## 2020-09-27 MED ORDER — PROAIR HFA 108 (90 BASE) MCG/ACT IN AERS: 1.0000 | INHALATION_SPRAY | Freq: Four times a day (QID) | RESPIRATORY_TRACT | 5 refills | Status: DC | PRN

## 2020-09-27 MED ORDER — ATORVASTATIN CALCIUM 40 MG PO TABS: 40.0000 mg | ORAL_TABLET | Freq: Every day | ORAL | 1 refills | Status: DC

## 2020-09-27 MED ORDER — AMLODIPINE BESYLATE 5 MG PO TABS: 5.0000 mg | ORAL_TABLET | Freq: Every day | ORAL | 1 refills | Status: DC

## 2020-09-27 MED ORDER — TRELEGY ELLIPTA 100-62.5-25 MCG/INH IN AEPB: INHALATION_SPRAY | RESPIRATORY_TRACT | 6 refills | Status: DC

## 2020-09-27 NOTE — Patient Instructions (Signed)

## 2020-09-27 NOTE — Progress Notes (Signed)
[facility]   Subjective:   [name] is a 65 y.o. female presents for  establish care. Patient is concerned about her weight requesting weight loss medication. Recently joined the gym and started exercising but states her problems is sweets and snacking. She does have a dx COPD and is short of breath she uses oxygen at bedtime 2 liters. Past Medical History:  Diagnosis Date   Anxiety    Asthma    Chest pain    COPD (chronic obstructive pulmonary disease) ([facility])    Depression    Hypertension    Pneumonia    Shortness of breath dyspnea      Allergies  Allergen Reactions   Flonase [Fluticasone Propionate]     Nose bleeds      Current Outpatient Medications on File Prior to Visit  Medication Sig Dispense Refill   albuterol (PROVENTIL) (2.5 MG/3ML) 0.083% nebulizer solution Take 3 mLs (2.5 mg total) by nebulization every 6 (six) hours as needed for wheezing or shortness of breath. 75 mL 12   amitriptyline (ELAVIL) 50 MG tablet TAKE 2 TABLETS BY MOUTH EVERY NIGHT AT BEDTIME 60 tablet 1   amLODipine (NORVASC) 10 MG tablet Take 10 mg by mouth daily.     aspirin 81 MG tablet Take 81 mg by mouth daily.     atorvastatin (LIPITOR) 20 MG tablet Take 20 mg by mouth daily at 6 PM.     bisoprolol (ZEBETA) 10 MG tablet Take 1 tablet (10 mg total) by mouth 2 (two) times daily. 60 tablet 5   Camphor-Eucalyptus-Menthol (VICKS VAPORUB EX) Apply 1 application topically 2 (two) times daily as needed (breathe and soreness).     dextromethorphan-guaiFENesin (TUSSIN DM) 10-100 MG/5ML liquid Take 30 mLs by mouth every 4 (four) hours as needed for cough.     dicyclomine (BENTYL) 10 MG capsule TAKE 1 CAPSULE BY MOUTH 2 TIMES A DAY 60 capsule 1   esomeprazole (NEXIUM) 40 MG capsule TAKE 1 CAPSULE BY MOUTH DAILY 30 capsule 1   FLUoxetine (PROZAC) 20 MG capsule TAKE 1 CAPSULE BY MOUTH EVERY DAY 30 capsule 1   Linaclotide (LINZESS) 145 MCG CAPS capsule Take 145 mcg by mouth daily.      loratadine (CLARITIN) 10 MG tablet Take 10 mg by mouth daily.     meloxicam (MOBIC) 15 MG tablet Take 15 mg by mouth daily.  0   montelukast (SINGULAIR) 10 MG tablet Take 10 mg by mouth at bedtime.     nitroGLYCERIN (NITROSTAT) 0.3 MG SL tablet Place 0.3 mg under the tongue every 5 (five) minutes as needed for chest pain (may repeat x3).     phenylephrine (SUDAFED PE) 10 MG TABS tablet Take 10 mg by mouth every 4 (four) hours as needed (cough phlegm).     PROAIR HFA 108 (90 Base) MCG/ACT inhaler INHALE 2 PUFFS BY MOUTH FOUR TIMES A DAY AS NEEDED 8.5 g 5   tiZANidine (ZANAFLEX) 4 MG tablet Take 4 mg by mouth at bedtime as needed for muscle spasms.      valsartan-hydrochlorothiazide (DIOVAN-HCT) 320-25 MG per tablet Take 1 tablet by mouth daily.     zolpidem (AMBIEN) 10 MG tablet Take 10 mg by mouth at bedtime as needed for sleep.     No current facility-administered medications on file prior to visit.     Review of System: Review of Systems  Respiratory:  Positive for cough, shortness of breath and wheezing.   All other systems reviewed and are  negative.  Objective:  Ht '5\' 7"'$  (1.702 m)   Wt 229 lb 6.4 oz (104.1 kg)   BMI 35.93 kg/m   Filed Weights   09/27/20 1401  Weight: 229 lb 6.4 oz (104.1 kg)   Physical Exam: General Appearance: Well nourished, obese female in mild apparent distress. Eyes: PERRLA, EOMs, conjunctiva no swelling or erythema Sinuses: No Frontal/maxillary tenderness ENT/Mouth: Ext aud canals clear, TMs without erythema, bulging.  Hearing normal.  Neck: Supple, thyroid normal.  Respiratory: Respiratory effort normal, BS equal bilaterally without rales, rhonchi, wheezing or stridor.  Cardio: RRR with no MRGs. Brisk peripheral pulses without edema.  Abdomen: Soft, + BS.  Non tender, no guarding, rebound, hernias, masses. Lymphatics: Non tender without lymphadenopathy.  Musculoskeletal: Full ROM, 5/5 strength, normal gait.  Skin: Warm, dry without rashes, lesions,  ecchymosis.  Neuro: Cranial nerves intact. Normal muscle tone, no cerebellar symptoms. Sensation intact.  Psych: Awake and oriented X 3, normal affect, Insight and Judgment appropriate.    Assessment:  Lenix was seen today for new patient (initial visit).  Diagnoses and all orders for this visit:  Essential hypertension .Counseled on blood pressure goal of less than 130/80, low-sodium, DASH diet, medication compliance, 150 minutes of moderate intensity exercise per week. Discussed medication compliance, adverse effects.  -     carvedilol (COREG) 12.5 MG tablet; Take 1 tablet (12.5 mg total) by mouth 2 (two) times daily with a meal. -     lisinopril-hydrochlorothiazide (ZESTORETIC) 20-12.5 MG tablet; Take 1 tablet by mouth daily.  Class 2 severe obesity due to excess calories with serious comorbidity and body mass index (BMI) of 35.0 to 35.9 in adult [facility]) Obesity is 30-39 indicating an excess in caloric intake or underlining conditions. This may lead to other co-morbidities. Lifestyle modifications of diet and exercise may reduce obesity.    COPD GOLD 0  -     Fluticasone-Umeclidin-Vilant (TRELEGY ELLIPTA) 100-62.5-25 MCG/INH AEPB; USE 1 INHALATION BY MOUTH  ONCE DAILY AT THE SAME TIME EACH DAY -     Ambulatory referral to Pulmonology  Mixed hyperlipidemia  Remember - more fruits and vegetables, more fish, and limit red meat and dairy products.  More soy, nuts, beans, barley, lentils, oats and plant sterol ester enriched margarine instead of butter.  I also encourage eliminating sugar and processed food.  Remember, shop on the outside of the grocery store and visit your [employer].   If you would like to talk with me about dietary changes plus or minus medications for your cholesterol, please let me know. We should recheck your cholesterol in 3-6 months. -     atorvastatin (LIPITOR) 40 MG tablet; Take 1 tablet (40 mg total) by mouth daily.  Shortness of breath -     PROAIR HFA 108 (90  Base) MCG/ACT inhaler; Inhale 1-2 puffs into the lungs every 6 (six) hours as needed for wheezing or shortness of breath. -     Ambulatory referral to Pulmonology  Depression, unspecified depression type Flowsheet Row Office Visit from 09/27/2020 in [place]  PHQ-9 Total Score 13       -     FLUoxetine (PROZAC) 40 MG capsule; Take 1 capsule (40 mg total) by mouth daily. -     traZODone (DESYREL) 50 MG tablet; Take 1 tablet (50 mg total) by mouth at bedtime as needed.  Other orders/Medication refill -     amLODipine (NORVASC) 5 MG tablet; Take 1 tablet (5 mg total) by mouth  daily. -     montelukast (SINGULAIR) 10 MG tablet; Take 1 tablet (10 mg total) by mouth at bedtime.   This note has been created with Surveyor, quantity. Any transcriptional errors are unintentional.   Kerin Perna, NP 09/27/2020, 2:05 PM

## 2020-10-03 ENCOUNTER — Ambulatory Visit (INDEPENDENT_AMBULATORY_CARE_PROVIDER_SITE_OTHER): Payer: Self-pay | Admitting: *Deleted

## 2020-10-03 NOTE — Telephone Encounter (Signed)
Pt called stating that she recently had an appt with Juluis Mire. She states that she has had medication changes and she is nor sure what she should or should not be taking. She is requesting to have a nurse give her a call back to discuss. Please advise.  Reason for Disposition . [1] Caller has NON-URGENT medicine question about med that PCP prescribed AND [2] triager unable to answer question  Answer Assessment - Initial Assessment Questions 1. NAME of MEDICATION: "What medicine are you calling about?"     Metformin 500 mg once daily and Linzess 145 mg daily 2. QUESTION: "What is your question?" (e.g., double dose of medicine, side effect)     Patient states these medication were on her current list she gave provider at NP appointment- but not prescribed- she wants to know why she was not continued on these medications 3. PRESCRIBING HCP: "Who prescribed it?" Reason: if prescribed by specialist, call should be referred to that group.     Patient came in as NP- new to area- Rx prescribed by former provider  Patient requesting continuation of medication Patient states [facility] not listed as PCP in chart Patient has not heard from referral- Pulmonology Does patient still need to see [place]- she has seen Oletta Lamas now?  Protocols used: Medication Question Call-A-AH

## 2020-10-03 NOTE — Telephone Encounter (Signed)
Patient had NP appointment recently and has follow up question about 2 medications not prescribed- they were on original medication list. She also is concerned: PCP listed in chart, has not heard from referral yet, does she need to have appointment with [facility]? Please call her with provider response

## 2020-10-03 NOTE — Telephone Encounter (Signed)
Sent to PCP to contact patient.

## 2020-10-04 ENCOUNTER — Telehealth (INDEPENDENT_AMBULATORY_CARE_PROVIDER_SITE_OTHER): Payer: Self-pay

## 2020-10-04 ENCOUNTER — Other Ambulatory Visit (INDEPENDENT_AMBULATORY_CARE_PROVIDER_SITE_OTHER): Payer: Self-pay | Admitting: Primary Care

## 2020-10-04 DIAGNOSIS — R7303 Prediabetes: Secondary | ICD-10-CM

## 2020-10-04 DIAGNOSIS — K5900 Constipation, unspecified: Secondary | ICD-10-CM

## 2020-10-04 MED ORDER — LINACLOTIDE 145 MCG PO CAPS: 145.0000 ug | ORAL_CAPSULE | Freq: Every day | ORAL | 1 refills | Status: DC

## 2020-10-04 MED ORDER — METFORMIN HCL ER 500 MG PO TB24: 500.0000 mg | ORAL_TABLET | Freq: Every day | ORAL | 1 refills | Status: DC

## 2020-10-04 NOTE — Telephone Encounter (Signed)
Copied from [place] [id number]. Topic: General - Other >> Oct 04, 2020 11:01 AM Jodie Echevaria wrote: Reason for CRM: Patient called in to inform Ms Oletta Lamas that she is still waiting on a call back from her to discuss concerns with her medication. Please call Ph# [phone]

## 2020-10-06 ENCOUNTER — Other Ambulatory Visit (INDEPENDENT_AMBULATORY_CARE_PROVIDER_SITE_OTHER): Payer: Self-pay | Admitting: Primary Care

## 2020-10-06 DIAGNOSIS — R7303 Prediabetes: Secondary | ICD-10-CM

## 2020-10-11 ENCOUNTER — Other Ambulatory Visit (INDEPENDENT_AMBULATORY_CARE_PROVIDER_SITE_OTHER): Payer: Self-pay | Admitting: Primary Care

## 2020-10-11 DIAGNOSIS — R7303 Prediabetes: Secondary | ICD-10-CM

## 2020-10-17 ENCOUNTER — Ambulatory Visit (INDEPENDENT_AMBULATORY_CARE_PROVIDER_SITE_OTHER): Payer: Medicare Other | Admitting: Pulmonary Disease

## 2020-10-17 ENCOUNTER — Telehealth: Payer: Self-pay | Admitting: Pulmonary Disease

## 2020-10-17 ENCOUNTER — Other Ambulatory Visit: Payer: Self-pay

## 2020-10-17 ENCOUNTER — Encounter: Payer: Self-pay | Admitting: Pulmonary Disease

## 2020-10-17 VITALS — BP 130/68 | HR 100 | Ht 67.0 in | Wt 228.2 lb

## 2020-10-17 DIAGNOSIS — J432 Centrilobular emphysema: Secondary | ICD-10-CM

## 2020-10-17 DIAGNOSIS — J9611 Chronic respiratory failure with hypoxia: Secondary | ICD-10-CM | POA: Insufficient documentation

## 2020-10-17 DIAGNOSIS — Z23 Encounter for immunization: Secondary | ICD-10-CM | POA: Diagnosis not present

## 2020-10-17 DIAGNOSIS — G4733 Obstructive sleep apnea (adult) (pediatric): Secondary | ICD-10-CM

## 2020-10-17 DIAGNOSIS — G4730 Sleep apnea, unspecified: Secondary | ICD-10-CM | POA: Insufficient documentation

## 2020-10-17 NOTE — Assessment & Plan Note (Signed)
Emphysema has been noted on previous CT scans.  Surprisingly there was no airway obstruction in 2016 but spirometry in 2021 does show significant airway obstruction, she has received subjective benefit from Trelegy and we will maintain her on this.  She will use albuterol on as-needed basis. She completed pulmonary rehab program in 2021 and has benefited I asked her to continue to try and maintain an active lifestyle

## 2020-10-17 NOTE — Progress Notes (Signed)
Subjective:    [name], female    DOB: [birth year], 65 y.o.   MRN: [id number]  HPI  Chief Complaint  Patient presents with   Consult    Patient has shortness of breath with exertion, hx of COPD. Patient wears 2 liters oxygen at night. Is currently on Trelegy and rescue inhaler. Patient currently taking mucinex at night. Productive cough with yellow/white sputum.   65 year old ex-smoker presents to establish care for COPD and chronic hypoxic respiratory failure She also reports a remote diagnosis of sarcoidosis but is not clear on what biopsy was performed, does not seem to have been maintained on steroids and no evidence of lymphadenopathy noted on prior chest imaging. She lives in [place] and was prior seen by my partner in 2016 I have reviewed prior office visit.  She then moved to [place] and then moved back to [place] where she established with Dr. Quentin Cornwall have reviewed that evaluation and spirometry and CT chest done.  PFTs showed airway obstruction and she was noted to be hypoxic.  She was placed on an oxygen tank which she uses during sleep, given samples of Trelegy and underwent a pulmonary rehab program with some improvement. She reports dyspnea on exertion , cough with minimal sputum production.  Dyspnea is worse with walking and relieved by sitting. She smoked 1 to 1.5 packs/day for more than 30 years, more than 45 pack years until she quit in 2010.  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   Significant tests/ events reviewed  PFts 07/2014 no airway obstruction, ratio 73, FEV1 83%, FVC 85%, DLCO 19.5/68%, TLC 1 1 7%.  11/2019 spirometry ratio 58, FEV1 1.63/72%, FVC 98%  CT angiogram chest 02/2020 moderate emphysema, left adrenal adenoma 25  mm.  CT angiogram 01/2015 right upper lobe pneumonia, moderate emphysema, 2 cm left adrenal adenoma   Past Medical History:  Diagnosis Date   Anxiety    Asthma    Chest pain    COPD (chronic obstructive pulmonary disease) ([place])    Depression    Hypertension    Pneumonia    Shortness of breath dyspnea     Past Surgical History:  Procedure Laterality Date   PARTIAL HYSTERECTOMY  2007   TEE WITHOUT CARDIOVERSION N/A 08/24/2014   Procedure: TRANSESOPHAGEAL ECHOCARDIOGRAM (TEE);  Surgeon: Adrian Prows, MD;  Location: [facility] ENDOSCOPY;  Service: Cardiovascular;  Laterality: N/A;    Allergies  Allergen Reactions   Flonase [Fluticasone Propionate]     Nose bleeds    Social History   Socioeconomic History   Marital status: Single    Spouse name: Not on file   Number of children: Not on file   Years of education: Not on file   Highest education level: Not on file  Occupational History   Not on file  Tobacco Use   Smoking status: Former    Packs/day: 1.00    Years: 38.00    Pack years: 38.00    Types: Cigarettes    Quit date: 01/16/2008    Years since quitting: 12.7   Smokeless tobacco: Never  Vaping Use   Vaping Use: Never used  Substance and Sexual Activity   Alcohol use: No    Alcohol/week: 0.0 standard drinks  Drug use: No   Sexual activity: Not on file  Other Topics Concern   Not on file  Social History Narrative   Not on file   Social Determinants of Health   Financial Resource Strain: Not on file  Food Insecurity: Not on file  Transportation Needs: Not on file  Physical Activity: Not on file  Stress: Not on file  Social Connections: Not on file  Intimate Partner Violence: Not on file     Family History  Problem Relation Age of Onset   Allergies Mother    Allergies Daughter    Asthma Son        had as a child       Review of Systems  Constitutional: negative for anorexia, fevers and sweats  Eyes: negative for irritation, redness and visual  disturbance  Ears, nose, mouth, throat, and face: negative for earaches, epistaxis, nasal congestion and sore throat  Respiratory: negative for sputum and wheezing  Cardiovascular: negative for chest pain, lower extremity edema, orthopnea, palpitations and syncope  Gastrointestinal: negative for abdominal pain, constipation, diarrhea, melena, nausea and vomiting  Genitourinary:negative for dysuria, frequency and hematuria  Hematologic/lymphatic: negative for bleeding, easy bruising and lymphadenopathy  Musculoskeletal:negative for arthralgias, muscle weakness and stiff joints  Neurological: negative for coordination problems, gait problems, headaches and weakness  Endocrine: negative for diabetic symptoms including polydipsia, polyuria and weight loss     Objective:   Physical Exam  Gen. Pleasant, obese, in no distress, normal affect ENT - no pallor,icterus, no post nasal drip, class 2 airway Neck: No JVD, no thyromegaly, no carotid bruits Lungs: no use of accessory muscles, no dullness to percussion, decreased without rales or rhonchi  Cardiovascular: Rhythm regular, heart sounds  normal, no murmurs or gallops, no peripheral edema Abdomen: soft and non-tender, no hepatosplenomegaly, BS normal. Musculoskeletal: No deformities, no cyanosis or clubbing Neuro:  alert, non focal, no tremors        Assessment & Plan:

## 2020-10-17 NOTE — Assessment & Plan Note (Signed)
We will reassess with a home sleep test

## 2020-10-17 NOTE — Telephone Encounter (Signed)
Home sleep study was ordered on patient but since she wears night time oxygen the [facility] said it will need to be an in lab study since she is on oxygen.  Dr. Elsworth Soho please advise

## 2020-10-17 NOTE — Assessment & Plan Note (Addendum)
She does have an oxygen concentrator which she uses during sleep.  Oxygen saturation dropped to 86% on walking and we will send an order for portable oxygen concentrator

## 2020-10-17 NOTE — Patient Instructions (Signed)
Ambulatory sat Flu shot

## 2020-10-18 ENCOUNTER — Telehealth: Payer: Self-pay | Admitting: Pulmonary Disease

## 2020-10-18 ENCOUNTER — Other Ambulatory Visit (INDEPENDENT_AMBULATORY_CARE_PROVIDER_SITE_OTHER): Payer: Self-pay | Admitting: Primary Care

## 2020-10-18 DIAGNOSIS — R7303 Prediabetes: Secondary | ICD-10-CM

## 2020-10-18 MED ORDER — METFORMIN HCL ER 500 MG PO TB24: 500.0000 mg | ORAL_TABLET | Freq: Every day | ORAL | 1 refills | Status: DC

## 2020-10-18 NOTE — Telephone Encounter (Signed)
Noted PCC's aware. Nothing further needed at this time.

## 2020-10-19 NOTE — Telephone Encounter (Signed)
Call returned to patient, confirmed DOB. She could not recall whether she needed additional oxygen after her office visit. I made her aware per the ambulatory oxygen test she does need oxygen during the day as well and the order has been sent to [place]. She also wanted to know what she needs to do about her sleep study. I made her aware someone from our office would reach out to her over the next 2-3 weeks. Voiced understanding.   Nothing further needed at this time.

## 2020-11-08 ENCOUNTER — Other Ambulatory Visit: Payer: Self-pay

## 2020-11-08 ENCOUNTER — Ambulatory Visit (INDEPENDENT_AMBULATORY_CARE_PROVIDER_SITE_OTHER): Payer: Medicare Other | Admitting: Internal Medicine

## 2020-11-08 ENCOUNTER — Encounter: Payer: Self-pay | Admitting: Internal Medicine

## 2020-11-08 VITALS — BP 136/76 | HR 72 | Resp 24 | Ht 67.0 in | Wt 225.0 lb

## 2020-11-08 DIAGNOSIS — Z6835 Body mass index (BMI) 35.0-35.9, adult: Secondary | ICD-10-CM

## 2020-11-08 DIAGNOSIS — E119 Type 2 diabetes mellitus without complications: Secondary | ICD-10-CM | POA: Diagnosis not present

## 2020-11-08 DIAGNOSIS — I10 Essential (primary) hypertension: Secondary | ICD-10-CM | POA: Diagnosis not present

## 2020-11-08 DIAGNOSIS — Z1231 Encounter for screening mammogram for malignant neoplasm of breast: Secondary | ICD-10-CM | POA: Diagnosis not present

## 2020-11-08 DIAGNOSIS — E782 Mixed hyperlipidemia: Secondary | ICD-10-CM

## 2020-11-08 DIAGNOSIS — Z114 Encounter for screening for human immunodeficiency virus [HIV]: Secondary | ICD-10-CM

## 2020-11-08 DIAGNOSIS — J441 Chronic obstructive pulmonary disease with (acute) exacerbation: Secondary | ICD-10-CM | POA: Insufficient documentation

## 2020-11-08 DIAGNOSIS — Z23 Encounter for immunization: Secondary | ICD-10-CM

## 2020-11-08 DIAGNOSIS — G4733 Obstructive sleep apnea (adult) (pediatric): Secondary | ICD-10-CM

## 2020-11-08 DIAGNOSIS — E785 Hyperlipidemia, unspecified: Secondary | ICD-10-CM | POA: Insufficient documentation

## 2020-11-08 DIAGNOSIS — J449 Chronic obstructive pulmonary disease, unspecified: Secondary | ICD-10-CM | POA: Insufficient documentation

## 2020-11-08 DIAGNOSIS — F32A Depression, unspecified: Secondary | ICD-10-CM | POA: Insufficient documentation

## 2020-11-08 DIAGNOSIS — Z79899 Other long term (current) drug therapy: Secondary | ICD-10-CM

## 2020-11-08 DIAGNOSIS — Z1159 Encounter for screening for other viral diseases: Secondary | ICD-10-CM

## 2020-11-08 NOTE — Progress Notes (Signed)
Subjective:    [name], female   DOB: 1955/01/31, 65 y.o.   MRN: [id number]   HPI  Here to establish Moved back to [facility] about this time 2021.      Hypertension:  Diagnosed about 4 years ago.  Feels has been controlled with current meds.  Amlodipine/Lisinopril/HCTZ/Carvedilol.  2.   Obesity/DM Type II:  Feels developed in 2021--was prediabetic range for some time prior.   Went to a Dr. Vickie Epley in [place] in December of 2021.  Does not have an Ophthalmologist here yet. Denies renal, eye, neurologic, cardiac complications of DM Last A1C was 6.9% in 6/22.   [place] [facility] [place].1.   Metformin ER  3.  Hyperlipidemia:  takes Atorvastatin--states controls her cholesterol well.  Last LDL was about 1 year ago with LDL at 112, rest of cholesterol panel at goal ([facility])    4.  IBS:  continues on Linzess  5.  Hx of Sardoidosis:  Diagnosed in 53s and treated with prednisone for some time.  Ultimately tapered off.    6.  COPD/Emphysema:  History of tobacco abuse.  Uses O2 at night--2L  [facility] supplies her oxygen.  Randall Hiss delivers from [place].  Trelegy Ellipta.  ProAir rescue inhaler.  7.  OSA:  She left her CPAP in [place].  She believes her OSA was diagnosed here, but unable to find sleep study.  8.  Depression:  Up and down for years.  No thoughts of suicide.  Continues on Fluoxetine.  Trazodone for sleep  9.  GERD:  Nexium.    10.  Chronic pain syndrome:  states at one time dx with fibromyalgia, but not much pain now.    11.  HM:  Has not set up mammogram.  Has not received another Pfizer booster recently--more thabn 2 months ago and not the new bivalent.  Has not had shingles vaccine or tetanus recently.        Current Meds  Medication Sig   amLODipine (NORVASC) 5 MG tablet Take 1 tablet (5 mg total) by mouth daily.   aspirin 81 MG tablet Take 81 mg by mouth daily.   atorvastatin (LIPITOR) 40 MG tablet Take 1 tablet  (40 mg total) by mouth daily.   Camphor-Eucalyptus-Menthol (VICKS VAPORUB EX) Apply 1 application topically 2 (two) times daily as needed (breathe and soreness).   carvedilol (COREG) 12.5 MG tablet Take 1 tablet (12.5 mg total) by mouth 2 (two) times daily with a meal.   esomeprazole (NEXIUM) 40 MG capsule TAKE 1 CAPSULE BY MOUTH DAILY   FLUoxetine (PROZAC) 40 MG capsule Take 1 capsule (40 mg total) by mouth daily.   Fluticasone-Umeclidin-Vilant (TRELEGY ELLIPTA) 100-62.5-25 MCG/INH AEPB USE 1 INHALATION BY MOUTH  ONCE DAILY AT THE SAME TIME EACH DAY   linaclotide (LINZESS) 145 MCG CAPS capsule Take 1 capsule (145 mcg total) by mouth daily before breakfast.   lisinopril-hydrochlorothiazide (ZESTORETIC) 20-12.5 MG tablet Take 1 tablet by mouth daily.   metFORMIN (GLUCOPHAGE XR) 500 MG 24 hr tablet Take 1 tablet (500 mg total) by mouth daily with breakfast.   montelukast (SINGULAIR) 10 MG tablet Take 1 tablet (10 mg total) by mouth at bedtime.   PROAIR HFA 108 (90 Base) MCG/ACT inhaler Inhale 1-2 puffs into the lungs every 6 (six) hours as needed for wheezing or shortness of breath.   tiZANidine (ZANAFLEX) 2 MG tablet Take 2 mg by mouth every 6 (six) hours as needed.  traZODone (DESYREL) 50 MG tablet Take 1 tablet (50 mg total) by mouth at bedtime as needed.   Allergies  Allergen Reactions   Flonase [Fluticasone Propionate]     Nose bleeds   Past Medical History:  Diagnosis Date   Anxiety    Asthma    Chest pain    COPD (chronic obstructive pulmonary disease) ([facility])    Depression    DM type 2 (diabetes mellitus, type 2) ([place])    Hypertension    Pneumonia    Shortness of breath dyspnea    Past Surgical History:  Procedure Laterality Date   PARTIAL HYSTERECTOMY  2007   TEE WITHOUT CARDIOVERSION N/A 08/24/2014   Procedure: TRANSESOPHAGEAL ECHOCARDIOGRAM (TEE);  Surgeon: Adrian Prows, MD;  Location: [facility] ENDOSCOPY;  Service: Cardiovascular;  Laterality: N/A;   Family History  Problem  Relation Age of Onset   Allergies Mother    Allergies Daughter    Asthma Son        had as a child   Social History   Socioeconomic History   Marital status: Single    Spouse name: Not on file   Number of children: Not on file   Years of education: Not on file   Highest education level: Not on file  Occupational History   Not on file  Tobacco Use   Smoking status: Former    Packs/day: 1.00    Years: 38.00    Total pack years: 38.00    Types: Cigarettes    Quit date: 01/16/2008    Years since quitting: 13.7   Smokeless tobacco: Never  Vaping Use   Vaping Use: Never used  Substance and Sexual Activity   Alcohol use: No    Alcohol/week: 0.0 standard drinks of alcohol   Drug use: No   Sexual activity: Not on file  Other Topics Concern   Not on file  Social History Narrative   Not on file   Social Determinants of Health   Financial Resource Strain: Not on file  Food Insecurity: Not on file  Transportation Needs: Not on file  Physical Activity: Not on file  Stress: Not on file  Social Connections: Not on file  Intimate Partner Violence: Not on file     Review of Systems    Objective:   BP 136/76 (BP Location: Right Arm, Patient Position: Sitting, Cuff Size: Large)   Pulse 72   Resp (!) 24   Ht 5\' 7"  (1.702 m)   Wt 225 lb (102.1 kg)   BMI 35.24 kg/m   Physical Exam NAD HEENT:  PERRL, EOMI, Discs sharp.  TMs pearly gray, throat without injection. Neck:  Supple, No adenopathy, no thyromegaly Chest:  CTA CV:  RRR with normal S1 and S2, No S3, S4 or murmur.  No carotid bruits.  Carotid, radial and DP/PT pulses normal and equal Abd:  S, NT, No HSM or mass, + BS LE:  No edema.   Assessment & Plan     Hypertension:  control fine.  Continue current meds. CMP  2.  DM/obesity:  A1C.  Urine microalbumin/crea.  Ophthalmology referral.  Discussed diet and physical activity.  3.  COPD:  needs [facility]abel [facility] and [facility].  Check with [facility]  4.   OSA:  needs CPAP equipment.  Check with [facility] and send for old records for sleep study.  Otherwise, suspect will need to repeat for equipment.  5.  Hyperlipidemia:  describes an LDL not at goal  when last checked.  Non fasting today.  Will have her return for this with Tdap in 1 month.  6.  HM:  Screen for HIV, Hep C, Pfizer bivalent vaccine and Shingrix today.  Schedule mammogram.  Tdap in 1 month.  Return for 2nd Shingrix in 2-6 months.  Pneumococcal 23 in Feb of 2023.    7.  Depression:  continue prozac.  8.  Chronic pain syndrome:  currently quiescent with current therapy.

## 2020-11-09 LAB — CBC WITH DIFFERENTIAL/PLATELET
Basophils Absolute: 0 10*3/uL (ref 0.0–0.2)
Basos: 1 %
EOS (ABSOLUTE): 0.1 10*3/uL (ref 0.0–0.4)
Eos: 2 %
Hematocrit: 41.8 % (ref 34.0–46.6)
Hemoglobin: 13.9 g/dL (ref 11.1–15.9)
Immature Grans (Abs): 0 10*3/uL (ref 0.0–0.1)
Immature Granulocytes: 1 %
Lymphocytes Absolute: 1.8 10*3/uL (ref 0.7–3.1)
Lymphs: 31 %
MCH: 30 pg (ref 26.6–33.0)
MCHC: 33.3 g/dL (ref 31.5–35.7)
MCV: 90 fL (ref 79–97)
Monocytes Absolute: 0.5 10*3/uL (ref 0.1–0.9)
Monocytes: 9 %
Neutrophils Absolute: 3.3 10*3/uL (ref 1.4–7.0)
Neutrophils: 56 %
Platelets: 217 10*3/uL (ref 150–450)
RBC: 4.63 x10E6/uL (ref 3.77–5.28)
RDW: 12.6 % (ref 11.7–15.4)
WBC: 5.8 10*3/uL (ref 3.4–10.8)

## 2020-11-09 LAB — COMPREHENSIVE METABOLIC PANEL
ALT: 23 IU/L (ref 0–32)
AST: 18 IU/L (ref 0–40)
Albumin/Globulin Ratio: 1.4 (ref 1.2–2.2)
Albumin: 4.2 g/dL (ref 3.8–4.8)
Alkaline Phosphatase: 70 IU/L (ref 44–121)
BUN/Creatinine Ratio: 18 (ref 12–28)
BUN: 12 mg/dL (ref 8–27)
Bilirubin Total: 0.3 mg/dL (ref 0.0–1.2)
CO2: 22 mmol/L (ref 20–29)
Calcium: 10.9 mg/dL — ABNORMAL HIGH (ref 8.7–10.3)
Chloride: 106 mmol/L (ref 96–106)
Creatinine, Ser: 0.67 mg/dL (ref 0.57–1.00)
Globulin, Total: 2.9 g/dL (ref 1.5–4.5)
Glucose: 126 mg/dL — ABNORMAL HIGH (ref 70–99)
Potassium: 4.2 mmol/L (ref 3.5–5.2)
Sodium: 140 mmol/L (ref 134–144)
Total Protein: 7.1 g/dL (ref 6.0–8.5)
eGFR: 97 mL/min/{1.73_m2} (ref >59)

## 2020-11-09 LAB — HGB A1C W/O EAG: Hgb A1c MFr Bld: 7 % — ABNORMAL HIGH (ref 4.8–5.6)

## 2020-11-09 LAB — MICROALBUMIN / CREATININE URINE RATIO
Creatinine, Urine: 137.4 mg/dL
Microalb/Creat Ratio: 2 mg/g creat (ref 0–29)
Microalbumin, Urine: 3.1 ug/mL

## 2020-11-09 LAB — HIV ANTIBODY (ROUTINE TESTING W REFLEX): HIV Screen 4th Generation wRfx: NONREACTIVE

## 2020-11-09 LAB — HEPATITIS C ANTIBODY: Hep C Virus Ab: <0.1 s/co ratio (ref 0.0–0.9)

## 2020-12-15 ENCOUNTER — Other Ambulatory Visit (INDEPENDENT_AMBULATORY_CARE_PROVIDER_SITE_OTHER): Payer: Self-pay | Admitting: Primary Care

## 2020-12-15 DIAGNOSIS — I10 Essential (primary) hypertension: Secondary | ICD-10-CM

## 2020-12-16 NOTE — Telephone Encounter (Signed)
Sent to PCP ?

## 2020-12-22 ENCOUNTER — Ambulatory Visit: Payer: Medicare Other | Admitting: Internal Medicine

## 2020-12-22 ENCOUNTER — Other Ambulatory Visit: Payer: Self-pay

## 2020-12-26 ENCOUNTER — Other Ambulatory Visit: Payer: Self-pay

## 2020-12-26 ENCOUNTER — Other Ambulatory Visit (INDEPENDENT_AMBULATORY_CARE_PROVIDER_SITE_OTHER): Payer: Medicare Other

## 2020-12-26 DIAGNOSIS — R509 Fever, unspecified: Secondary | ICD-10-CM | POA: Diagnosis not present

## 2020-12-26 LAB — POC COVID19 BINAXNOW: SARS Coronavirus 2 Ag: NEGATIVE

## 2020-12-26 LAB — POCT INFLUENZA A/B
Influenza A, POC: NEGATIVE
Influenza B, POC: NEGATIVE

## 2020-12-26 NOTE — Progress Notes (Signed)
Patient came to get a COVID and Influenza test after experiencing chills, congestion, chest pain, and fatigue since 12/25/2020. Patient has taken mucinex and tylenol which have only helped some.  Patient was negative for both COVID and Influenza. Patient would like recommendations for symptoms. Recommended to visit urgent care if her symptoms worsen before we are able to call her back with any recommendations

## 2020-12-28 ENCOUNTER — Other Ambulatory Visit: Payer: Self-pay

## 2020-12-28 ENCOUNTER — Ambulatory Visit (INDEPENDENT_AMBULATORY_CARE_PROVIDER_SITE_OTHER): Payer: Medicare Other | Admitting: Internal Medicine

## 2020-12-28 ENCOUNTER — Encounter: Payer: Self-pay | Admitting: Internal Medicine

## 2020-12-28 VITALS — BP 138/78 | HR 80 | Resp 24 | Ht 67.0 in | Wt 224.0 lb

## 2020-12-28 DIAGNOSIS — J069 Acute upper respiratory infection, unspecified: Secondary | ICD-10-CM | POA: Diagnosis not present

## 2020-12-28 NOTE — Patient Instructions (Signed)
Drink lots of fluids--avoid sugary liquids Okay to continue Mucinex.  Stay away for cough suppressants Use your nasal wash Call if you develop shortness of breath.

## 2020-12-28 NOTE — Progress Notes (Signed)
° ° °  Subjective:    [name], female   DOB: January 29, 1955, 65 y.o.   MRN: [id number]   HPI  Started getting ill about 3 days ago.  Frontal headache.  Body aches and soreness in anterior chest with cough.  Coughing up yellow mucous.  Is having posterior pharyngeal drainage.  Has felt feverish, but has not checked fever.  Poor appetite for a couple of days.  Mild dyspnea at times.  Using Proair, but not any more above baseline.  Continues with Trelegy daily.   Blood sugars have been okay--in mid 90s this morning.  Current Meds  Medication Sig   acetaminophen (TYLENOL) 325 MG tablet Take 650 mg by mouth every 6 (six) hours as needed.   amLODipine (NORVASC) 5 MG tablet Take 1 tablet (5 mg total) by mouth daily.   aspirin 81 MG tablet Take 81 mg by mouth daily.   atorvastatin (LIPITOR) 40 MG tablet Take 1 tablet (40 mg total) by mouth daily.   Camphor-Eucalyptus-Menthol (VICKS VAPORUB EX) Apply 1 application topically 2 (two) times daily as needed (breathe and soreness).   carvedilol (COREG) 12.5 MG tablet TAKE 1 TABLET BY MOUTH  TWICE DAILY WITH MEALS   esomeprazole (NEXIUM) 40 MG capsule TAKE 1 CAPSULE BY MOUTH DAILY   FLUoxetine (PROZAC) 40 MG capsule Take 1 capsule (40 mg total) by mouth daily.   Fluticasone-Umeclidin-Vilant (TRELEGY ELLIPTA) 100-62.5-25 MCG/INH AEPB USE 1 INHALATION BY MOUTH  ONCE DAILY AT THE SAME TIME EACH DAY   linaclotide (LINZESS) 145 MCG CAPS capsule Take 1 capsule (145 mcg total) by mouth daily before breakfast.   lisinopril-hydrochlorothiazide (ZESTORETIC) 20-12.5 MG tablet Take 1 tablet by mouth daily.   metFORMIN (GLUCOPHAGE XR) 500 MG 24 hr tablet Take 1 tablet (500 mg total) by mouth daily with breakfast.   montelukast (SINGULAIR) 10 MG tablet Take 1 tablet (10 mg total) by mouth at bedtime.   PROAIR HFA 108 (90 Base) MCG/ACT inhaler Inhale 1-2 puffs into the lungs every 6 (six) hours as needed for wheezing or shortness of breath.   tiZANidine  (ZANAFLEX) 2 MG tablet Take 2 mg by mouth every 6 (six) hours as needed.   traZODone (DESYREL) 50 MG tablet Take 1 tablet (50 mg total) by mouth at bedtime as needed.   Allergies  Allergen Reactions   Flonase [Fluticasone Propionate]     Nose bleeds     Review of Systems    Objective:   BP 138/78 (BP Location: Left Arm, Patient Position: Sitting, Cuff Size: Normal)    Pulse 80    Resp (!) 24    Ht 5\' 7"  (1.702 m)    Wt 224 lb (101.6 kg)    BMI 35.08 kg/m   Physical Exam Unable to get a pulse ox as long heavily painted nails and earlobe quite small Congested cough, clearing throat HEENT:  PERRL, EOMI, TMs pearly gray, throat without injection, MMM.  Nasal mucosa swollen with clear discharge. Neck:  Supple, No adenopathy Lungs:  CTA with good air movement CV:  RRR without murmur or rub.  Radial pulses normal and equal.  Cap refill brisk at digits. Abd:  S, + BS, NT LE:  No edema. Assessment & Plan    Viral URI:  already improving.  Continue Mucinex.  Encouraged her nasal wash--sounds similar to neti pot.  Push fluids, not sugary.  Call if develops dyspnea or increased symptoms.

## 2021-01-03 ENCOUNTER — Ambulatory Visit: Payer: Medicare Other

## 2021-01-03 ENCOUNTER — Other Ambulatory Visit: Payer: Self-pay

## 2021-01-03 DIAGNOSIS — G4733 Obstructive sleep apnea (adult) (pediatric): Secondary | ICD-10-CM

## 2021-01-11 ENCOUNTER — Other Ambulatory Visit: Payer: Self-pay | Admitting: Internal Medicine

## 2021-01-11 DIAGNOSIS — R7303 Prediabetes: Secondary | ICD-10-CM

## 2021-01-11 MED ORDER — METFORMIN HCL ER 500 MG PO TB24: ORAL_TABLET | ORAL | 3 refills | Status: DC

## 2021-01-17 ENCOUNTER — Telehealth: Payer: Self-pay | Admitting: Pulmonary Disease

## 2021-01-17 ENCOUNTER — Other Ambulatory Visit: Payer: Self-pay

## 2021-01-17 ENCOUNTER — Ambulatory Visit (INDEPENDENT_AMBULATORY_CARE_PROVIDER_SITE_OTHER): Payer: Medicare Other | Admitting: Primary Care

## 2021-01-17 ENCOUNTER — Encounter: Payer: Self-pay | Admitting: Primary Care

## 2021-01-17 VITALS — BP 96/52 | Temp 98.3°F | Ht 66.0 in | Wt 227.4 lb

## 2021-01-17 DIAGNOSIS — J9611 Chronic respiratory failure with hypoxia: Secondary | ICD-10-CM | POA: Diagnosis not present

## 2021-01-17 DIAGNOSIS — G4733 Obstructive sleep apnea (adult) (pediatric): Secondary | ICD-10-CM | POA: Diagnosis not present

## 2021-01-17 DIAGNOSIS — J449 Chronic obstructive pulmonary disease, unspecified: Secondary | ICD-10-CM

## 2021-01-17 DIAGNOSIS — J432 Centrilobular emphysema: Secondary | ICD-10-CM | POA: Diagnosis not present

## 2021-01-17 NOTE — Assessment & Plan Note (Addendum)
-   Stable interval; Emphysema noted on previous CT scan,  Pulmonary function testing in 2016 showed no overt obstruction, however, spirometry in 2021 did showed significant airway obstruction. She reports clinical benefit from Trelegy maintenance inhaler

## 2021-01-17 NOTE — Patient Instructions (Addendum)
-   Home sleep study showed mild OSA, recommend holding off on starting CPAP (may need to down the line however) - Encourage weight loss - We will check overnight oximeter on 2L  - Continue TRELEGY one puff daily, Singulair 10mg  and Proair rescue inhaler every 4-6 hours as needed for sob/wheezing  Orders: - Check ONO on 2L oxygen  Follow-up: - 6-8 weeks with Dr. Elsworth Soho (or his first available)

## 2021-01-17 NOTE — Progress Notes (Signed)
@Patient  ID: [name], female    DOB: 15-Mar-1955, 66 y.o.   MRN: [id number]  Chief Complaint  Patient presents with   Follow-up    Patient has no complaints.    Referring provider: No ref. provider found  HPI: 66 year old female, former smoker. PMH significant for HTN, centrilobular emphysema, COPD GOLD 0, OSA, chronic respiratory failure, obesity, mixed hyperlipidemia. Patient of Dr. Elsworth Soho, last seen on 10/17/20. Maintained on Trelegy and 2L oxygen at night.   Previous [facility] pulmonary encounter: 10/17/20- Dr. Elsworth Soho  65 year old ex-smoker presents to establish care for COPD and chronic hypoxic respiratory failure She also reports a remote diagnosis of sarcoidosis but is not clear on what biopsy was performed, does not seem to have been maintained on steroids and no evidence of lymphadenopathy noted on prior chest imaging. She lives in [place] and was prior seen by my partner in 2016 I have reviewed prior office visit.  She then moved to [place] and then moved back to [place] where she established with Dr. Quentin Cornwall have reviewed that evaluation and spirometry and CT chest done.  PFTs showed airway obstruction and she was noted to be hypoxic.  She was placed on an oxygen tank which she uses during sleep, given samples of Trelegy and underwent a pulmonary rehab program with some improvement. She reports dyspnea on exertion , cough with minimal sputum production.  Dyspnea is worse with walking and relieved by sitting. She smoked 1 to 1.5 packs/day for more than 30 years, more than 45 pack years until she quit in 2010.  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   01/17/2021- Interim hx  Patient presents today for 3 month follow-up. HST showed mild OSA, AHI 7/hr. May be underestimate,  suggest holding off on CPAP for now. Oxygen levels dropped significantly so she needs to stay on nightly O2. Needs ONO on 2L   She is doing well, no acute complaints. She had a cough 1 week ago which resolve with prn mucinex. She gets winded with walking. She is able to all ADLs. She uses a scooter in the grocery store.  She uses her Trelegy 129mcg every day. She is sleeping alright, she will take over the counter Equate sleep medication. She wakes up 2-3 times at night to use the restroom. She occasionally will wake herself up snoring. She is getting restless sleep. She does not currently have a CPAP machine. She does think she slept better when she worse PAP therapy. She wear 2L oxygen at night and as needed during the day. Denies shortness of breath, chest tightness or wheezing.    Significant tests/ events reviewed   PFts 07/2014 no airway obstruction, ratio 73, FEV1 83%, FVC 85%, DLCO 19.5/68%, TLC 1 1 7%.  11/2019 spirometry ratio 58, FEV1 1.63/72%, FVC 98%   CT angiogram chest 02/2020 moderate emphysema, left adrenal adenoma 25 mm.  CT angiogram 01/2015 right upper lobe pneumonia, moderate emphysema, 2 cm left adrenal adenoma Allergies  Allergen Reactions   Flonase [Fluticasone Propionate]     Nose bleeds    Immunization History  Administered Date(s) Administered   Fluad Quad(high Dose 65+) 10/17/2020   Influenza,inj,Quad PF,6+ Mos 10/30/2019   PFIZER(Purple Top)SARS-COV-2 Vaccination 04/09/2019, 05/08/2019   Pfizer Covid-19 Vaccine  Bivalent Booster 74yrs & up 11/08/2020   Pneumococcal Conjugate-13 02/16/2020   Zoster Recombinat (Shingrix) 11/08/2020    Past Medical History:  Diagnosis Date   Anxiety    Asthma    Chest pain    COPD (chronic obstructive pulmonary disease) (HCC)    Depression    Hypertension    Pneumonia    Shortness of breath dyspnea     Tobacco History: Social History   Tobacco Use  Smoking Status Former   Packs/day: 1.00   Years: 38.00   Pack  years: 38.00   Types: Cigarettes   Quit date: 01/16/2008   Years since quitting: 13.0  Smokeless Tobacco Never   Counseling given: Not Answered   Outpatient Medications Prior to Visit  Medication Sig Dispense Refill   acetaminophen (TYLENOL) 325 MG tablet Take 650 mg by mouth every 6 (six) hours as needed.     amLODipine (NORVASC) 5 MG tablet Take 1 tablet (5 mg total) by mouth daily. 90 tablet 1   aspirin 81 MG tablet Take 81 mg by mouth daily.     atorvastatin (LIPITOR) 40 MG tablet Take 1 tablet (40 mg total) by mouth daily. 90 tablet 1   Camphor-Eucalyptus-Menthol (VICKS VAPORUB EX) Apply 1 application topically 2 (two) times daily as needed (breathe and soreness).     carvedilol (COREG) 12.5 MG tablet TAKE 1 TABLET BY MOUTH  TWICE DAILY WITH MEALS 180 tablet 3   esomeprazole (NEXIUM) 40 MG capsule TAKE 1 CAPSULE BY MOUTH DAILY 30 capsule 1   FLUoxetine (PROZAC) 40 MG capsule Take 1 capsule (40 mg total) by mouth daily. 90 capsule 1   Fluticasone-Umeclidin-Vilant (TRELEGY ELLIPTA) 100-62.5-25 MCG/INH AEPB USE 1 INHALATION BY MOUTH  ONCE DAILY AT THE SAME TIME EACH DAY 28 each 6   linaclotide (LINZESS) 145 MCG CAPS capsule Take 1 capsule (145 mcg total) by mouth daily before breakfast. 90 capsule 1   lisinopril-hydrochlorothiazide (ZESTORETIC) 20-12.5 MG tablet Take 1 tablet by mouth daily. 90 tablet 1   metFORMIN (GLUCOPHAGE XR) 500 MG 24 hr tablet 1 tab by mouth twice daily with meals 180 tablet 3   montelukast (SINGULAIR) 10 MG tablet Take 1 tablet (10 mg total) by mouth at bedtime. 90 tablet 1   nitroGLYCERIN (NITROSTAT) 0.3 MG SL tablet Place 0.3 mg under the tongue every 5 (five) minutes as needed for chest pain (may repeat x3).     PROAIR HFA 108 (90 Base) MCG/ACT inhaler Inhale 1-2 puffs into the lungs every 6 (six) hours as needed for wheezing or shortness of breath. 8.5 g 5   tiZANidine (ZANAFLEX) 2 MG tablet Take 2 mg by mouth every 6 (six) hours as needed.     traZODone  (DESYREL) 50 MG tablet Take 1 tablet (50 mg total) by mouth at bedtime as needed. 90 tablet 1   No facility-administered medications prior to visit.    Review of Systems  Review of Systems  Constitutional:  Negative for fatigue.  Respiratory: Negative.  Negative for cough, shortness of breath and wheezing.   Psychiatric/Behavioral:  Positive for sleep disturbance.     Physical Exam  BP (!) 96/52 (BP Location: Left Arm, Patient Position: Sitting, Cuff Size: Large)    Temp 98.3 F (36.8 C) (Oral)    Ht 5\' 6"  (1.676 m)    Wt 227 lb 6.4 oz (103.1 kg)    BMI 36.70 kg/m  Physical Exam Constitutional:      General: She is in acute distress.  Appearance: Normal appearance. She is not ill-appearing.  HENT:     Head: Normocephalic and atraumatic.     Mouth/Throat:     Mouth: Mucous membranes are moist.     Pharynx: Oropharynx is clear.  Cardiovascular:     Rate and Rhythm: Normal rate and regular rhythm.  Pulmonary:     Effort: Pulmonary effort is normal.     Breath sounds: Normal breath sounds. No wheezing, rhonchi or rales.     Comments: CTA Musculoskeletal:        General: Normal range of motion.  Skin:    General: Skin is warm.  Neurological:     General: No focal deficit present.     Mental Status: She is alert and oriented to person, place, and time. Mental status is at baseline.  Psychiatric:        Mood and Affect: Mood normal.        Behavior: Behavior normal.        Thought Content: Thought content normal.        Judgment: Judgment normal.     Lab Results:  CBC    Component Value Date/Time   WBC 5.8 11/08/2020 1509   WBC 7.1 11/20/2014 1649   RBC 4.63 11/08/2020 1509   RBC 4.44 11/20/2014 1649   HGB 13.9 11/08/2020 1509   HCT 41.8 11/08/2020 1509   PLT 217 11/08/2020 1509   MCV 90 11/08/2020 1509   MCH 30.0 11/08/2020 1509   MCH 30.2 11/20/2014 1649   MCHC 33.3 11/08/2020 1509   MCHC 32.8 11/20/2014 1649   RDW 12.6 11/08/2020 1509   LYMPHSABS 1.8  11/08/2020 1509   MONOABS 0.3 08/01/2008 1640   EOSABS 0.1 11/08/2020 1509   BASOSABS 0.0 11/08/2020 1509    BMET    Component Value Date/Time   NA 140 11/08/2020 1509   K 4.2 11/08/2020 1509   CL 106 11/08/2020 1509   CO2 22 11/08/2020 1509   GLUCOSE 126 (H) 11/08/2020 1509   GLUCOSE 160 (H) 11/20/2014 1649   BUN 12 11/08/2020 1509   CREATININE 0.67 11/08/2020 1509   CALCIUM 10.9 (H) 11/08/2020 1509   GFRNONAA >60 11/20/2014 1649   GFRAA >60 11/20/2014 1649    BNP No results found for: BNP  ProBNP No results found for: PROBNP  Imaging: No results found.   Assessment & Plan:   Centrilobular emphysema ([place]) - Stable interval; Emphysema noted on previous CT scan,  Pulmonary function testing in 2016 showed no overt obstruction, however, spirometry in 2021 did showed significant airway obstruction. She reports clinical benefit from Trelegy maintenance inhaler   Chronic respiratory failure with hypoxia ([place]) - Patient uses 2L oxygen at night and prn during the day to maintain O2 >88-90%   Mild sleep apnea - HST in December 2022 showed mild OSA, AHI 7/hr. Holding off on CPAP at this time, may consider down the line as she did notice clinical benefit from PAP therapy in the past. Encourage weight loss. Plan check ONO on 2L. FU in 6-8 weeks with Dr. Elsworth Soho  COPD GOLD 0  - Stable; No recent hospitalizations or COPD exacerbations requiring oral abx or steroids. She did have a cough that resolved with prn mucinex. She completed pulmonary rehab in 2021. Advised she continue to stay active. Continue Trelegy 159mcg one puff daily. No further changes today.     Martyn Ehrich, NP 01/17/2021

## 2021-01-17 NOTE — Assessment & Plan Note (Signed)
-   Patient uses 2L oxygen at night and prn during the day to maintain O2 >88-90%

## 2021-01-17 NOTE — Assessment & Plan Note (Addendum)
-   Stable; No recent hospitalizations or COPD exacerbations requiring oral abx or steroids. She did have a cough that resolved with prn mucinex. She completed pulmonary rehab in 2021. Advised she continue to stay active. Continue Trelegy 188mcg one puff daily. No further changes today.

## 2021-01-17 NOTE — Telephone Encounter (Signed)
HST showed mild  OSA with AHI 7/ hr This may be an underestimate but suggest holding off on CPAP for now  Oxygen levels dropped significantly so she should stay on nightly O2 Check ONO on 2L O2 &  OV with me/APP in 6 wks

## 2021-01-17 NOTE — Assessment & Plan Note (Addendum)
-   HST in December 2022 showed mild OSA, AHI 7/hr. Holding off on CPAP at this time, may consider down the line as she did notice clinical benefit from PAP therapy in the past. Encourage weight loss. Plan check ONO on 2L. FU in 6-8 weeks with Dr. Elsworth Soho

## 2021-01-24 NOTE — Telephone Encounter (Signed)
Tried calling the pt and there was no answer and no option to leave msg.

## 2021-02-01 ENCOUNTER — Encounter: Payer: Self-pay | Admitting: *Deleted

## 2021-02-01 ENCOUNTER — Telehealth: Payer: Self-pay | Admitting: Pulmonary Disease

## 2021-02-01 NOTE — Telephone Encounter (Signed)
Called pt again and still no answer or option to leave msg.  I have mailed her a letter to call.

## 2021-02-01 NOTE — Telephone Encounter (Signed)
ONO on 2 L did not show any significant desaturations. Only 6.9 minutes less than 88% Continue on 2 L of oxygen

## 2021-02-03 NOTE — Telephone Encounter (Signed)
ATC patient regarding ONO results. Per DPR left detailed message with results and advised her to call with any questions or concerns.   Nothing further needed at this time.

## 2021-02-18 ENCOUNTER — Encounter: Payer: Self-pay | Admitting: Internal Medicine

## 2021-02-18 DIAGNOSIS — E119 Type 2 diabetes mellitus without complications: Secondary | ICD-10-CM | POA: Insufficient documentation

## 2021-02-27 ENCOUNTER — Other Ambulatory Visit: Payer: Self-pay

## 2021-02-27 ENCOUNTER — Encounter: Payer: Self-pay | Admitting: Pulmonary Disease

## 2021-02-27 ENCOUNTER — Ambulatory Visit (INDEPENDENT_AMBULATORY_CARE_PROVIDER_SITE_OTHER): Payer: Medicare Other | Admitting: Pulmonary Disease

## 2021-02-27 DIAGNOSIS — J9611 Chronic respiratory failure with hypoxia: Secondary | ICD-10-CM | POA: Diagnosis not present

## 2021-02-27 DIAGNOSIS — J432 Centrilobular emphysema: Secondary | ICD-10-CM

## 2021-02-27 DIAGNOSIS — G4730 Sleep apnea, unspecified: Secondary | ICD-10-CM

## 2021-02-27 NOTE — Assessment & Plan Note (Signed)
She will continue on Trelegy and use albuterol for rescue Previous imaging was reviewed and there is does not appear to be any evidence of sarcoidosis She would qualify for lung cancer surveillance with low-dose CT scans

## 2021-02-27 NOTE — Progress Notes (Signed)
° °  Subjective:    [name], female    DOB: July 24, 1955, 66 y.o.   MRN: [id number]  HPI  66 year old ex-smoker for FU of COPD and chronic hypoxic respiratory failure on nocturnal oxygen, OSA -She carries a diagnosis of sarcoidosis but no details available, never been biopsied She smoked 1 to 1.5 packs/day for more than 30 years, more than 45 pack years until she quit in 2010.  I reviewed home sleep test today.  She tells me she is moving back to [place].  She has had some differences with her siblings and mother.  She already has doctors in [place] has told her that her oxygen can be transferred. Trelegy has worked well for her and she would like to continue this. She has albuterol/ProAir as needed for rescue   Significant tests/ events reviewed  HST showed mild OSA, AHI 6/hr, lowest saturation 72%  10/2020 AMb sat >> drop to 86 % with walking  PFts 07/2014 no airway obstruction, ratio 73, FEV1 83%, FVC 85%, DLCO 19.5/68%, TLC 1 1 7%.  11/2019 spirometry ratio 58, FEV1 1.63/72%, FVC 98%   CT angiogram chest 02/2020 moderate emphysema, left adrenal adenoma 25 mm.  CT angiogram 01/2015 right upper lobe pneumonia, moderate emphysema, 2 cm left adrenal adenoma  Review of Systems neg for any significant sore throat, dysphagia, itching, sneezing, nasal congestion or excess/ purulent secretions, fever, chills, sweats, unintended wt loss, pleuritic or exertional cp, hempoptysis, orthopnea pnd or change in chronic leg swelling. Also denies presyncope, palpitations, heartburn, abdominal pain, nausea, vomiting, diarrhea or change in bowel or urinary habits, dysuria,hematuria, rash, arthralgias, visual complaints, headache, numbness weakness or ataxia.     Objective:   Physical Exam  Gen. Pleasant, obese, in no distress ENT - no lesions, no post nasal drip Neck: No JVD, no thyromegaly, no carotid bruits Lungs: no use of accessory muscles, no dullness to percussion,  decreased without rales or rhonchi  Cardiovascular: Rhythm regular, heart sounds  normal, no murmurs or gallops, no peripheral edema Musculoskeletal: No deformities, no cyanosis or clubbing , no tremors       Assessment & Plan:

## 2021-02-27 NOTE — Assessment & Plan Note (Signed)
OSA appears to be very mild and doubt she needs CPAP therapy.

## 2021-02-27 NOTE — Patient Instructions (Signed)
COntinue on oxygen during sleep Continue on Trelegy

## 2021-02-27 NOTE — Assessment & Plan Note (Signed)
She does have severe nocturnal desaturation and I have advised her to continue on nocturnal oxygen. Saturation on room air today is 93% asked her to get a pulse oximeter and use oxygen in the daytime only as needed

## 2021-03-08 ENCOUNTER — Other Ambulatory Visit (INDEPENDENT_AMBULATORY_CARE_PROVIDER_SITE_OTHER): Payer: Self-pay | Admitting: Primary Care

## 2021-03-08 DIAGNOSIS — I10 Essential (primary) hypertension: Secondary | ICD-10-CM

## 2021-03-08 DIAGNOSIS — E782 Mixed hyperlipidemia: Secondary | ICD-10-CM

## 2021-03-08 DIAGNOSIS — F32A Depression, unspecified: Secondary | ICD-10-CM

## 2021-03-11 ENCOUNTER — Other Ambulatory Visit (INDEPENDENT_AMBULATORY_CARE_PROVIDER_SITE_OTHER): Payer: Self-pay | Admitting: Primary Care

## 2021-03-11 DIAGNOSIS — J449 Chronic obstructive pulmonary disease, unspecified: Secondary | ICD-10-CM

## 2021-03-13 NOTE — Telephone Encounter (Signed)
Sent to PCP to refill.

## 2021-03-22 ENCOUNTER — Other Ambulatory Visit: Payer: Self-pay

## 2021-03-22 ENCOUNTER — Encounter: Payer: Self-pay | Admitting: Internal Medicine

## 2021-03-22 ENCOUNTER — Ambulatory Visit (INDEPENDENT_AMBULATORY_CARE_PROVIDER_SITE_OTHER): Payer: Medicare Other | Admitting: Internal Medicine

## 2021-03-22 VITALS — BP 142/80 | HR 90 | Resp 21 | Ht 67.0 in | Wt 230.5 lb

## 2021-03-22 DIAGNOSIS — I10 Essential (primary) hypertension: Secondary | ICD-10-CM | POA: Diagnosis not present

## 2021-03-22 DIAGNOSIS — E119 Type 2 diabetes mellitus without complications: Secondary | ICD-10-CM | POA: Diagnosis not present

## 2021-03-22 DIAGNOSIS — L853 Xerosis cutis: Secondary | ICD-10-CM

## 2021-03-22 DIAGNOSIS — B351 Tinea unguium: Secondary | ICD-10-CM

## 2021-03-22 DIAGNOSIS — G4730 Sleep apnea, unspecified: Secondary | ICD-10-CM

## 2021-03-22 DIAGNOSIS — Z6835 Body mass index (BMI) 35.0-35.9, adult: Secondary | ICD-10-CM

## 2021-03-22 DIAGNOSIS — E782 Mixed hyperlipidemia: Secondary | ICD-10-CM

## 2021-03-22 DIAGNOSIS — Z23 Encounter for immunization: Secondary | ICD-10-CM | POA: Diagnosis not present

## 2021-03-22 LAB — GLUCOSE, POCT (MANUAL RESULT ENTRY): POC Glucose: 141 mg/dl — AB (ref 70–99)

## 2021-03-22 NOTE — Progress Notes (Signed)
? ? ?Subjective:  ?  ?[name], female   DOB: Aug 23, 1955, 66 y.o.   MRN: [id number] ? ? ?HPI ? ?Planning to stay here at this point--looking for an apartment.  When last saw Dr. Davonna Belling, was planning move to [place].   ? ? Hypertension:  States she gets anxious at [employer], just wants to rest and have rechecked before leaving today.  On recheck, actually higher.  Her heart rate is at 90.  States she is not missing meds, but took both her of Carvedilol pills together this morning reportedly.  Takes all of her meds all together in morning, including Linzess and Nexium.   ? ?2.  DM:  Sugars dropping down to 43-72 in the mornings--generally 7 a.m. to 8:30 a.m.  Was 110 this morning before coming to clinic.  Feels weak and tired with these low blood sugars.  She did not see my message to increase her Metformin to twice daily as her A1C was at 7.0% and goal is just below that, so is only taking 500 mg of Metformin ER in the morning still.  ?Has eye appt with Dr. Katy Fitch next month. ? ? ?First meal is about 10 a.m.:  generally egg and [place] bacon, sometime wheat toast.  Sometimes sugar coated cereal like frosted flakes, oatmeal with flavoring and sugar.  Coffee, black. ?Sometimes skips ? ?Next meal:  around 1 or 2 p.m.:  Often nothing.  Pimento cheese sometimes on whole wheat.  Sardines with hot sauce and mayo.  Few Ritz crackers.  Was drinking a lot of diet sodas.  Now more with flavored waters.  ? ?Next meal:  5-6 p.m.:  [place] necks, cabbage, collards, mac and cheese.   ? ?Snack at bedtime:  corn chips with jalepenos.  Or peanut butter crackers in packs.  Ice oatmeal cookies.  Was eating granola bars a lot in evening.   ? ?Current Meds  ?Medication Sig  ? acetaminophen (TYLENOL) 325 MG tablet Take 650 mg by mouth every 6 (six) hours as needed.  ? amLODipine (NORVASC) 5 MG tablet Take 1 tablet (5 mg total) by mouth daily.  ? aspirin 81 MG tablet Take 81 mg by mouth daily.  ? atorvastatin (LIPITOR) 40  MG tablet Take 1 tablet (40 mg total) by mouth daily.  ? Camphor-Eucalyptus-Menthol (VICKS VAPORUB EX) Apply 1 application topically 2 (two) times daily as needed (breathe and soreness).  ? carvedilol (COREG) 12.5 MG tablet TAKE 1 TABLET BY MOUTH  TWICE DAILY WITH MEALS  ? esomeprazole (NEXIUM) 40 MG capsule TAKE 1 CAPSULE BY MOUTH DAILY  ? FLUoxetine (PROZAC) 40 MG capsule Take 1 capsule (40 mg total) by mouth daily.  ? linaclotide (LINZESS) 145 MCG CAPS capsule Take 1 capsule (145 mcg total) by mouth daily before breakfast.  ? lisinopril-hydrochlorothiazide (ZESTORETIC) 20-12.5 MG tablet Take 1 tablet by mouth daily.  ? metFORMIN (GLUCOPHAGE XR) 500 MG 24 hr tablet 1 tab by mouth twice daily with meals  ? montelukast (SINGULAIR) 10 MG tablet Take 1 tablet (10 mg total) by mouth at bedtime.  ? nitroGLYCERIN (NITROSTAT) 0.3 MG SL tablet Place 0.3 mg under the tongue every 5 (five) minutes as needed for chest pain (may repeat x3).  ? PROAIR HFA 108 (90 Base) MCG/ACT inhaler Inhale 1-2 puffs into the lungs every 6 (six) hours as needed for wheezing or shortness of breath.  ? tiZANidine (ZANAFLEX) 2 MG tablet Take 2 mg by mouth every 6 (six) hours as needed.  ?  traZODone (DESYREL) 50 MG tablet Take 1 tablet (50 mg total) by mouth at bedtime as needed.  ? TRELEGY ELLIPTA 100-62.5-25 MCG/ACT AEPB USE 1 INHALATION BY MOUTH  ONCE DAILY AT THE SAME TIME EACH DAY  ? ?Allergies  ?Allergen Reactions  ? Flonase [Fluticasone Propionate]   ?  Nose bleeds  ? ? ? ?Review of Systems ? ? ? ?Objective:  ? ?BP 140/80 (BP Location: Right Arm, Patient Position: Sitting, Cuff Size: Large)   Pulse 90   Resp (!) 21   Ht _0  (1.702 m)   Wt 230 lb 8 oz (104.6 kg)   BMI 36.10 kg/m?  ? ?Physical Exam ?Becomes dyspneic getting on exam table. ?Neck:  supple, No adenopathy ?Chest:  CTA ?CV:  RRR without murmur or rub.  Radial pulses normal and equal. ?Abd:  obeses, + BS, No HSM or mass, + BS ?LE:  No edema, skin dry ?  ?Diabetic Foot Exam -  Simple   ?Simple Foot Form ? 03/22/2021 12:30 PM  ?Visual Inspection ?No deformities, no ulcerations, no other skin breakdown bilaterally: Yes ?See comments: Yes ?Sensation Testing ?Intact to touch and monofilament testing bilaterally: Yes ?Pulse Check ?Posterior Tibialis and Dorsalis pulse intact bilaterally: Yes ?Comments ?Feet with significant dryness and toenails with thickening and discoloration of nail that can be seen through paint.  Right plantar heel with particular thickening and dryness of skin. ?  ? ?  ? ? ?Assessment & Plan  ? ? Hypertension:  She is getting meds mail order.  Asked her to check and see if they do the bubble packaging for morning and evening.  If not, recommend she get a twice daily dosing pill box.   ?Wrote out on meds to take Nexium 1 hour before breaksfast and Linzess 1/2 hour before breakfast.   ?She will take Lisinopril/HCTZ and Carvedilol in morning with breakfast and Amlodipine and Carvedilol with evening meal.  Suspect not adequately absorbing meds the way she is currently taking them. ?BP check with upcoming fasting labs ? ?2.  DM:  Keep Metformin only in morning with breakfast for now until she returns for labs.  Suspect having lows in morning as she frequently skips meals and is not eating generally in a healthy way.  Encouraged breakfast at 9 a.m. lunch at noon to 1 p.m. and evening meal at 6-7 p.m. with no eating after dinner most of time.  Went over what meals should contain.   ? ?3.  OSA:  Dr. Elsworth Soho only wants her to continue home O2 for now and not CPAP after home sleep test showed only mild OSA.   ? ?4.  Hyperlipidemia:  return in 1-2 weeks for fasting labs:  FLP, CMP, A1C, urine microalbumin/crea ? ?5.  HM:  Tdap and 2nd Shingrix today.  When in next, Pneumococcal 23.   ? ?6.  Toenail onychomycosis and dry feet:  not clear if some of the flaking is fungal.  To use Tea Tree Oil with foot cream once to twice daily. ? ?

## 2021-03-22 NOTE — Patient Instructions (Signed)
9 am:  breakfast ? ?12 to 1p.m.:  lunch ? ?6-7 p.m. dinner ? ?Avoid eating after dinner. ?Take Metformin with breakfast  ?

## 2021-03-23 ENCOUNTER — Other Ambulatory Visit (INDEPENDENT_AMBULATORY_CARE_PROVIDER_SITE_OTHER): Payer: Self-pay | Admitting: Primary Care

## 2021-03-29 ENCOUNTER — Other Ambulatory Visit (INDEPENDENT_AMBULATORY_CARE_PROVIDER_SITE_OTHER): Payer: Medicare Other

## 2021-03-29 ENCOUNTER — Other Ambulatory Visit: Payer: Self-pay

## 2021-03-29 DIAGNOSIS — E119 Type 2 diabetes mellitus without complications: Secondary | ICD-10-CM | POA: Diagnosis not present

## 2021-03-29 DIAGNOSIS — E782 Mixed hyperlipidemia: Secondary | ICD-10-CM

## 2021-03-29 DIAGNOSIS — Z79899 Other long term (current) drug therapy: Secondary | ICD-10-CM

## 2021-03-30 ENCOUNTER — Other Ambulatory Visit: Payer: Medicare Other

## 2021-03-30 LAB — COMPREHENSIVE METABOLIC PANEL
ALT: 27 IU/L (ref 0–32)
AST: 22 IU/L (ref 0–40)
Albumin/Globulin Ratio: 1.4 (ref 1.2–2.2)
Albumin: 3.8 g/dL (ref 3.8–4.8)
Alkaline Phosphatase: 66 IU/L (ref 44–121)
BUN/Creatinine Ratio: 16 (ref 12–28)
BUN: 12 mg/dL (ref 8–27)
Bilirubin Total: 0.4 mg/dL (ref 0.0–1.2)
CO2: 22 mmol/L (ref 20–29)
Calcium: 10.6 mg/dL — ABNORMAL HIGH (ref 8.7–10.3)
Chloride: 105 mmol/L (ref 96–106)
Creatinine, Ser: 0.75 mg/dL (ref 0.57–1.00)
Globulin, Total: 2.7 g/dL (ref 1.5–4.5)
Glucose: 148 mg/dL — ABNORMAL HIGH (ref 70–99)
Potassium: 4 mmol/L (ref 3.5–5.2)
Sodium: 139 mmol/L (ref 134–144)
Total Protein: 6.5 g/dL (ref 6.0–8.5)
eGFR: 88 mL/min/{1.73_m2} (ref >59)

## 2021-03-30 LAB — LIPID PANEL W/O CHOL/HDL RATIO
Cholesterol, Total: 166 mg/dL (ref 100–199)
HDL: 51 mg/dL (ref >39)
LDL Chol Calc (NIH): 102 mg/dL — ABNORMAL HIGH (ref 0–99)
Triglycerides: 70 mg/dL (ref 0–149)
VLDL Cholesterol Cal: 13 mg/dL (ref 5–40)

## 2021-03-30 LAB — MICROALBUMIN / CREATININE URINE RATIO
Creatinine, Urine: 205.3 mg/dL
Microalb/Creat Ratio: 4 mg/g creat (ref 0–29)
Microalbumin, Urine: 7.6 ug/mL

## 2021-03-30 LAB — HEMOGLOBIN A1C
Est. average glucose Bld gHb Est-mCnc: 154 mg/dL
Hgb A1c MFr Bld: 7 % — ABNORMAL HIGH (ref 4.8–5.6)

## 2021-04-03 ENCOUNTER — Telehealth: Payer: Self-pay

## 2021-04-03 NOTE — Telephone Encounter (Signed)
Patient would like a FreeStyle Libre 14 Day Sensor rx sent to [facility]. She is getting ready to use her last sensor to keep track of her glucose. ?

## 2021-04-05 NOTE — Telephone Encounter (Signed)
Correction: order should be sent to [facility] ?

## 2021-04-05 NOTE — Telephone Encounter (Signed)
[address] [place] [id number] ?

## 2021-04-06 MED ORDER — FREESTYLE LIBRE 14 DAY SENSOR MISC
12 refills | Status: DC
Start: 2021-04-06 — End: 2022-10-03

## 2021-05-11 LAB — HM DIABETES EYE EXAM

## 2021-05-15 ENCOUNTER — Other Ambulatory Visit: Payer: Self-pay

## 2021-05-15 DIAGNOSIS — E782 Mixed hyperlipidemia: Secondary | ICD-10-CM

## 2021-05-15 MED ORDER — ATORVASTATIN CALCIUM 40 MG PO TABS: 40.0000 mg | ORAL_TABLET | Freq: Every day | ORAL | 3 refills | Status: DC

## 2021-05-15 MED ORDER — ATORVASTATIN CALCIUM 40 MG PO TABS: 40.0000 mg | ORAL_TABLET | Freq: Every day | ORAL | 0 refills | Status: DC

## 2021-05-18 ENCOUNTER — Telehealth: Payer: Self-pay

## 2021-05-18 NOTE — Telephone Encounter (Signed)
Patient called about wanting to start on albuterol nebulizer instead of continuing her O2 tanks at night. Mention that she was previously given the [facility] at [facility], but since her insurance did not cover it, she could not afford to start on nebulizer  ?

## 2021-05-19 NOTE — Telephone Encounter (Signed)
Dr. Davonna Belling wants her on nighttime O2.  She is already using albuterol.  These are generally not interchangeable treatments.  Looks like she needs both.  Should bring this up with Dr. Davonna Belling, pulmonology ?

## 2021-05-22 ENCOUNTER — Other Ambulatory Visit: Payer: Self-pay | Admitting: Internal Medicine

## 2021-05-22 ENCOUNTER — Other Ambulatory Visit: Payer: Self-pay

## 2021-05-22 DIAGNOSIS — I10 Essential (primary) hypertension: Secondary | ICD-10-CM

## 2021-05-22 DIAGNOSIS — K5900 Constipation, unspecified: Secondary | ICD-10-CM

## 2021-05-22 DIAGNOSIS — F32A Depression, unspecified: Secondary | ICD-10-CM

## 2021-05-22 MED ORDER — AMLODIPINE BESYLATE 5 MG PO TABS: 5.0000 mg | ORAL_TABLET | Freq: Every day | ORAL | 3 refills | Status: DC

## 2021-05-22 MED ORDER — LINACLOTIDE 145 MCG PO CAPS: 145.0000 ug | ORAL_CAPSULE | Freq: Every day | ORAL | 3 refills | Status: DC

## 2021-05-22 MED ORDER — FLUOXETINE HCL 40 MG PO CAPS: 40.0000 mg | ORAL_CAPSULE | Freq: Every day | ORAL | 3 refills | Status: DC

## 2021-05-22 MED ORDER — MONTELUKAST SODIUM 10 MG PO TABS
10.0000 mg | ORAL_TABLET | Freq: Every day | ORAL | 3 refills | Status: DC
Start: 2021-05-22 — End: 2021-11-27

## 2021-05-22 MED ORDER — ATORVASTATIN CALCIUM 80 MG PO TABS: 80.0000 mg | ORAL_TABLET | Freq: Every day | ORAL | 3 refills | Status: DC

## 2021-05-22 MED ORDER — LISINOPRIL-HYDROCHLOROTHIAZIDE 20-12.5 MG PO TABS: 1.0000 | ORAL_TABLET | Freq: Every day | ORAL | 3 refills | Status: DC

## 2021-05-22 MED ORDER — TRAZODONE HCL 50 MG PO TABS: 50.0000 mg | ORAL_TABLET | Freq: Every evening | ORAL | 3 refills | Status: DC | PRN

## 2021-05-22 NOTE — Telephone Encounter (Signed)
Patient plans on having a conversation with Dr Davonna Belling about the use of a nebulizer ?

## 2021-07-04 ENCOUNTER — Encounter (HOSPITAL_COMMUNITY): Payer: Self-pay

## 2021-07-04 ENCOUNTER — Ambulatory Visit (HOSPITAL_COMMUNITY)
Admission: EM | Admit: 2021-07-04 | Discharge: 2021-07-04 | Disposition: A | Discharge status: Home / Self Care | Payer: Medicare Other | Attending: Physician Assistant | Admitting: Physician Assistant

## 2021-07-04 ENCOUNTER — Ambulatory Visit (INDEPENDENT_AMBULATORY_CARE_PROVIDER_SITE_OTHER): Payer: Medicare Other

## 2021-07-04 DIAGNOSIS — S6292XA Unspecified fracture of left wrist and hand, initial encounter for closed fracture: Secondary | ICD-10-CM

## 2021-07-04 DIAGNOSIS — S60222A Contusion of left hand, initial encounter: Secondary | ICD-10-CM | POA: Diagnosis not present

## 2021-07-04 DIAGNOSIS — M25562 Pain in left knee: Secondary | ICD-10-CM

## 2021-07-04 DIAGNOSIS — W19XXXA Unspecified fall, initial encounter: Secondary | ICD-10-CM

## 2021-07-04 DIAGNOSIS — M79642 Pain in left hand: Secondary | ICD-10-CM | POA: Diagnosis not present

## 2021-07-04 DIAGNOSIS — M25532 Pain in left wrist: Secondary | ICD-10-CM

## 2021-07-04 NOTE — Progress Notes (Signed)
Orthopedic Tech Progress Note Patient Details:  [name] 07-26-1955 [id number]  Ortho Devices Type of Ortho Device: Ulna gutter splint Ortho Device/Splint Location: LUE Ortho Device/Splint Interventions: Application, Ordered   Post Interventions Patient Tolerated: Well Instructions Provided: Poper ambulation with device, Care of device  Venida Tsukamoto A Winifred Balogh 07/04/2021, 5:17 PM

## 2021-07-04 NOTE — Discharge Instructions (Addendum)
Advised to take Tylenol or ibuprofen to relieve the pain from the hand and the wrist. Advised to follow-up with PCP or return to urgent care if symptoms fail to improve. You have been referred to Orthopedics for evaluation of Hand fracture. They should be calling to arrange an appointment.

## 2021-07-04 NOTE — ED Triage Notes (Signed)
Pt states tripped and fell on tile 2 days ago. C/o lt hand and knee swelling and pain. Took aleve for pain.

## 2021-07-04 NOTE — ED Provider Notes (Signed)
[place]    CSN: [id number] Arrival date & time: 07/04/21  1355      History   Chief Complaint Chief Complaint  Patient presents with   Fall    HPI [name] is a 66 y.o. female.   66 year old female presents with left hand and wrist pain, left knee pain.  Patient relates 2 days ago on Sunday that she was walking and slipped and fell on the tile, bracing the fall with the left hand.  Patient relates since that she has been having moderately severe left hand pain, bruising, and soreness.  Patient relates the pain is mainly located in the center of the hand, some pain is associated along the radial aspect of the wrist distally.  Patient relates that she has been taking Tylenol and ibuprofen without relief from her pain.  Patient also relates that she struck her left knee when she fell, she has having knee pain with mild swelling.  Patient denies numbness, tingling, or weakness of the lower legs.  Patient relates that she has mild shoulder soreness but she is able to move her left arm with minimal discomfort.   Fall    Past Medical History:  Diagnosis Date   Anxiety    Asthma    Chest pain    COPD (chronic obstructive pulmonary disease) ([place])    Depression    DM type 2 (diabetes mellitus, type 2) ([place])    Hypertension    Pneumonia    Shortness of breath dyspnea     Patient Active Problem List   Diagnosis Date Noted   Onychomycosis of toenail 03/22/2021   Dry skin dermatitis 03/22/2021   DM type 2 (diabetes mellitus, type 2) ([place]) 02/18/2021   COPD GOLD 0  11/08/2020   Depression 11/08/2020   Mixed hyperlipidemia 11/08/2020   Chronic respiratory failure with hypoxia ([place]) 10/17/2020   Mild sleep apnea 10/17/2020   Class 2 severe obesity due to excess calories with serious comorbidity and body mass index (BMI) of 35.0 to 35.9 in adult [facility]) 08/15/2014   Centrilobular emphysema ([place]) 06/26/2014   Primary hypertension 06/26/2014   Dyspnea 06/25/2014     Past Surgical History:  Procedure Laterality Date   PARTIAL HYSTERECTOMY  2007   TEE WITHOUT CARDIOVERSION N/A 08/24/2014   Procedure: TRANSESOPHAGEAL ECHOCARDIOGRAM (TEE);  Surgeon: Adrian Prows, MD;  Location: [facility] ENDOSCOPY;  Service: Cardiovascular;  Laterality: N/A;    OB History   No obstetric history on file.      Home Medications    Prior to Admission medications   Medication Sig Start Date End Date Taking? Authorizing Provider  acetaminophen (TYLENOL) 325 MG tablet Take 650 mg by mouth every 6 (six) hours as needed.    [provider]  amLODipine (NORVASC) 5 MG tablet Take 1 tablet (5 mg total) by mouth daily. 05/22/21   Mack Hook, MD  aspirin 81 MG tablet Take 81 mg by mouth daily.    [provider]  atorvastatin (LIPITOR) 80 MG tablet Take 1 tablet (80 mg total) by mouth daily. 05/22/21   Mack Hook, MD  Camphor-Eucalyptus-Menthol (VICKS VAPORUB EX) Apply 1 application topically 2 (two) times daily as needed (breathe and soreness).    [provider]  carvedilol (COREG) 12.5 MG tablet TAKE 1 TABLET BY MOUTH  TWICE DAILY WITH MEALS 12/16/20   Kerin Perna, NP  Continuous Blood Gluc Sensor (FREESTYLE LIBRE 14 DAY SENSOR) MISC Check blood glucose 3 times daily before meals and  as needed 04/06/21   Mack Hook, MD  esomeprazole (NEXIUM) 40 MG capsule TAKE 1 CAPSULE BY MOUTH DAILY 10/01/14   Parrett, Fonnie Mu, NP  FLUoxetine (PROZAC) 40 MG capsule Take 1 capsule (40 mg total) by mouth daily. 05/22/21   Mack Hook, MD  linaclotide Rolan Lipa) 145 MCG CAPS capsule Take 1 capsule (145 mcg total) by mouth daily before breakfast. 05/22/21   Mack Hook, MD  lisinopril-hydrochlorothiazide (ZESTORETIC) 20-12.5 MG tablet Take 1 tablet by mouth daily. 05/22/21   Mack Hook, MD  metFORMIN (GLUCOPHAGE XR) 500 MG 24 hr tablet 1 tab by mouth twice daily with meals 01/11/21   Mack Hook, MD  montelukast (SINGULAIR)  10 MG tablet Take 1 tablet (10 mg total) by mouth at bedtime. 05/22/21   Mack Hook, MD  nitroGLYCERIN (NITROSTAT) 0.3 MG SL tablet Place 0.3 mg under the tongue every 5 (five) minutes as needed for chest pain (may repeat x3).    [provider]  PROAIR HFA 108 [phone] Base) MCG/ACT inhaler Inhale 1-2 puffs into the lungs every 6 (six) hours as needed for wheezing or shortness of breath. 09/27/20   Kerin Perna, NP  tiZANidine (ZANAFLEX) 2 MG tablet Take 2 mg by mouth every 6 (six) hours as needed. 12/01/19   [provider]  traZODone (DESYREL) 50 MG tablet Take 1 tablet (50 mg total) by mouth at bedtime as needed. 05/22/21   Mack Hook, MD  TRELEGY ELLIPTA 100-62.5-25 MCG/ACT AEPB USE 1 INHALATION BY MOUTH  ONCE DAILY AT THE SAME TIME EACH DAY 03/13/21   Kerin Perna, NP    Family History Family History  Problem Relation Age of Onset   Allergies Mother    Allergies Daughter    Asthma Son        had as a child    Social History Social History   Tobacco Use   Smoking status: Former    Packs/day: 1.00    Years: 38.00    Total pack years: 38.00    Types: Cigarettes    Quit date: 01/16/2008    Years since quitting: 13.4   Smokeless tobacco: Never  Vaping Use   Vaping Use: Never used  Substance Use Topics   Alcohol use: No    Alcohol/week: 0.0 standard drinks of alcohol   Drug use: No     Allergies   Flonase [fluticasone propionate]   Review of Systems Review of Systems   Physical Exam Triage Vital Signs ED Triage Vitals  Enc Vitals Group     BP 07/04/21 1500 (!) 150/80     Pulse Rate 07/04/21 1500 81     Resp 07/04/21 1500 18     Temp 07/04/21 1500 98.2 F (36.8 C)     Temp Source 07/04/21 1500 Oral     SpO2 07/04/21 1500 90 %     Weight --      Height --      Head Circumference --      Peak Flow --      Pain Score 07/04/21 1501 8     Pain Loc --      Pain Edu? --      Excl. in [place]? --    No data found.  Updated  Vital Signs BP (!) 150/80 (BP Location: Left Arm)   Pulse 81   Temp 98.2 F (36.8 C) (Oral)   Resp 18   SpO2 90%   Visual Acuity Right Eye Distance:   Left Eye Distance:  Bilateral Distance:    Right Eye Near:   Left Eye Near:    Bilateral Near:     Physical Exam Constitutional:      Appearance: Normal appearance.  Musculoskeletal:     Left wrist: Tenderness present.     Comments: Left wrist: There is mild tenderness on palpation of the distal radial aspect of the wrist, flexion, extension, supination pronation are normal.  No swelling noted. Left hand: There is 2+ swelling with associated bruising of the posterior aspect of the hand located along the mid MCP areas.  Moderate tenderness on palpation.  Flexion extension of all digits are normal.  Capillary refill of all digits are normal.   Neurological:     Mental Status: She is alert.      UC Treatments / Results  Labs (all labs ordered are listed, but only abnormal results are displayed) Labs Reviewed - No data to display  EKG   Radiology DG Hand Complete Left  Result Date: 07/04/2021 CLINICAL DATA:  [place] 2 days ago on outstretched hand. Pain and swelling of the mid metacarpophalangeal areas. Radial sided wrist pain. EXAM: LEFT WRIST - COMPLETE 3+ VIEW; LEFT HAND - COMPLETE 3+ VIEW COMPARISON:  None Available. FINDINGS: Left wrist: Minimal 1 mm ulnar positive variance. Mild triscaphe and moderate thumb carpometacarpal joint space narrowing and peripheral osteophytosis. Mild rest of the intercarpal joint space narrowing diffusely. Left hand: There is diffuse decreased bone mineralization. Artificial nails are incidentally noted. Rings overlie the proximal phalanges of the third and fourth fingers. There is curvilinear lucency indicating an acute fracture of the proximal metaphysis of the proximal phalanx of the fifth finger with minimal volar apex angulation but otherwise no significant displacement. Mild joint space  narrowing of the interphalangeal joints diffusely. IMPRESSION: 1. Acute fracture of the proximal metaphysis of the proximal phalanx of fifth finger with minimal volar apex angulation but otherwise no significant displacement. 2. 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 Electronically Signed   By: Yvonne Kendall M.D.   On: 07/04/2021 15:53   DG Wrist Complete Left  Result Date: 07/04/2021 CLINICAL DATA:  [place] 2 days ago on outstretched hand. Pain and swelling of the mid metacarpophalangeal areas. Radial sided wrist pain. EXAM: LEFT WRIST - COMPLETE 3+ VIEW; LEFT HAND - COMPLETE 3+ VIEW COMPARISON:  None Available. FINDINGS: Left wrist: Minimal 1 mm ulnar positive variance. Mild triscaphe and moderate thumb carpometacarpal joint space narrowing and peripheral osteophytosis. Mild rest of the intercarpal joint space narrowing diffusely. Left hand: There is diffuse decreased bone mineralization. Artificial nails are incidentally noted. Rings overlie the proximal phalanges of the third and fourth fingers. There is curvilinear lucency indicating an acute fracture of the proximal metaphysis of the proximal phalanx of the fifth finger with minimal volar apex angulation but otherwise no significant displacement. Mild joint space narrowing of the interphalangeal joints diffusely. IMPRESSION: 1. Acute fracture of the proximal metaphysis of the proximal phalanx of fifth finger with minimal volar apex angulation but otherwise no significant displacement. 2. 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 Electronically Signed   By: Yvonne Kendall M.D.   On: 07/04/2021 15:53    Procedures Procedures (including critical care time)  Medications Ordered in UC Medications - No data to display  Initial Impression / Assessment and Plan / UC Course  I have reviewed the triage vital signs and the nursing notes.  Pertinent labs & imaging results that were available during my care of  the patient were reviewed  by me and considered in my medical decision making (see chart for details).    Plan: 1.  Advised to take ibuprofen or Tylenol to help reduce the pain and swelling. 2.  Advised to put ice to the area, 5 minutes on 20 minutes off, 4-5 times throughout the day to help reduce the swelling. 3.  Advised to follow-up with PCP or return to urgent care if symptoms fail to improve. Final Clinical Impressions(s) / UC Diagnoses   Final diagnoses:  Fall, initial encounter  Contusion of left hand, initial encounter  Left hand pain  Left wrist pain  Acute pain of left knee  Closed fracture of left hand, initial encounter     Discharge Instructions      Advised to take Tylenol or ibuprofen to relieve the pain from the hand and the wrist. Advised to follow-up with PCP or return to urgent care if symptoms fail to improve. You have been referred to Orthopedics for evaluation of Hand fracture. They should be calling to arrange an appointment.    ED Prescriptions   None    PDMP not reviewed this encounter.   Nyoka Lint, PA-C 07/04/21 1704

## 2021-07-04 NOTE — ED Notes (Signed)
Ortho tech notified for short arm splint needed.

## 2021-07-06 ENCOUNTER — Ambulatory Visit: Payer: Medicare Other | Admitting: Orthopedic Surgery

## 2021-07-07 ENCOUNTER — Ambulatory Visit (INDEPENDENT_AMBULATORY_CARE_PROVIDER_SITE_OTHER): Payer: Medicare Other | Admitting: Orthopedic Surgery

## 2021-07-07 DIAGNOSIS — S62617A Displaced fracture of proximal phalanx of left little finger, initial encounter for closed fracture: Secondary | ICD-10-CM

## 2021-07-13 ENCOUNTER — Telehealth: Payer: Self-pay | Admitting: Internal Medicine

## 2021-07-13 ENCOUNTER — Telehealth: Payer: Self-pay | Admitting: Pulmonary Disease

## 2021-07-13 DIAGNOSIS — J449 Chronic obstructive pulmonary disease, unspecified: Secondary | ICD-10-CM

## 2021-07-13 MED ORDER — TRELEGY ELLIPTA 100-62.5-25 MCG/ACT IN AEPB: 1.0000 | INHALATION_SPRAY | Freq: Every day | RESPIRATORY_TRACT | 0 refills | Status: DC

## 2021-07-13 NOTE — Telephone Encounter (Signed)
Called and spoke with patient. She stated that [facility] is late with her delivery and she is completely out. She was requesting a 30 day supply to be sent to [facility]. I advised her that I would go ahead and send in the RX for her. She verbalized understanding.   Nothing further needed at time of call.

## 2021-07-13 NOTE — Telephone Encounter (Signed)
Pt. Called requesting medical refills for TRELEGY ELLIPTA 100-62.5-25 MCG/ACT AEPB   Pt. States she is out of the medication and needs it immediately. She would like to have it sent to [facility] on [address].

## 2021-07-13 NOTE — Telephone Encounter (Signed)
After looking at the medication list, it appears that this patient already has a years worth of refills. Patient called a second time and without being provoked, patient was yelling and complaining that we have not fulfilled her request. Though patient made it difficult to talk, I attempted to explain that she had only called once a few hours ago to make the refill request and that she has to allow [place] time to get it done as we are seeing other patients throughout the day. I also explain to the patient that she needs to call days before her medications run out to request a refill so that she can give [place] enough time to complete. I also explained that it seems that she already has plenty of refills and that this is not a medication that we prescribed, so I was planning on discussing the request with Dr Amil Amen. Patient then continued to be irate and yelled that we "do not get things done", "she called multiple times" and she will "report [place] for not helping her and get a new doctor".   I called the pharmacy and confirmed that she has plenty of refill and that she simply needs to call them to request a refill.

## 2021-07-17 ENCOUNTER — Encounter: Payer: Self-pay | Admitting: Rehabilitative and Restorative Service Providers"

## 2021-07-17 ENCOUNTER — Other Ambulatory Visit: Payer: Self-pay

## 2021-07-17 ENCOUNTER — Ambulatory Visit (INDEPENDENT_AMBULATORY_CARE_PROVIDER_SITE_OTHER): Payer: Medicare Other | Admitting: Rehabilitative and Restorative Service Providers"

## 2021-07-17 DIAGNOSIS — R278 Other lack of coordination: Secondary | ICD-10-CM | POA: Diagnosis not present

## 2021-07-17 DIAGNOSIS — M25642 Stiffness of left hand, not elsewhere classified: Secondary | ICD-10-CM | POA: Diagnosis not present

## 2021-07-17 DIAGNOSIS — M79642 Pain in left hand: Secondary | ICD-10-CM | POA: Diagnosis not present

## 2021-07-17 DIAGNOSIS — R600 Localized edema: Secondary | ICD-10-CM

## 2021-07-17 DIAGNOSIS — M6281 Muscle weakness (generalized): Secondary | ICD-10-CM

## 2021-07-17 NOTE — Therapy (Signed)
OUTPATIENT OCCUPATIONAL THERAPY ORTHO EVALUATION  Patient Name: [name] MRN: [id number] DOB:1955-05-29, 66 y.o., female Today's Date: 07/17/2021  PCP: Dr. Mack Hook  REFERRING PROVIDER: Dr. Sherilyn Cooter    OT End of Session - 07/17/21 0934     Visit Number 1    Number of Visits 14    Date for OT Re-Evaluation 09/08/21    Authorization Type [employer] medicare 60 OT/ PT visits    Authorization - Number of Visits 27    OT Start Time [id number]    OT Stop Time 1025    OT Time Calculation (min) 47 min    Equipment Utilized During Treatment orthotic materials    Activity Tolerance Patient tolerated treatment well;No increased pain;Patient limited by fatigue;Patient limited by pain    Behavior During Therapy [facility] for tasks assessed/performed             Past Medical History:  Diagnosis Date   Anxiety    Asthma    Chest pain    COPD (chronic obstructive pulmonary disease) ([place])    Depression    DM type 2 (diabetes mellitus, type 2) ([place])    Hypertension    Pneumonia    Shortness of breath dyspnea    Past Surgical History:  Procedure Laterality Date   PARTIAL HYSTERECTOMY  2007   TEE WITHOUT CARDIOVERSION N/A 08/24/2014   Procedure: TRANSESOPHAGEAL ECHOCARDIOGRAM (TEE);  Surgeon: Adrian Prows, MD;  Location: [place];  Service: Cardiovascular;  Laterality: N/A;   Patient Active Problem List   Diagnosis Date Noted   Displaced fracture of proximal phalanx of left little finger, initial encounter for closed fracture 07/07/2021   Onychomycosis of toenail 03/22/2021   Dry skin dermatitis 03/22/2021   DM type 2 (diabetes mellitus, type 2) ([place]) 02/18/2021   COPD GOLD 0  11/08/2020   Depression 11/08/2020   Mixed hyperlipidemia 11/08/2020   Chronic respiratory failure with hypoxia ([place]) 10/17/2020   Mild sleep apnea 10/17/2020   Class 2 severe obesity due to excess calories with serious comorbidity and body mass index (BMI) of 35.0 to 35.9 in adult ([place]) 08/15/2014    Centrilobular emphysema ([place]) 06/26/2014   Primary hypertension 06/26/2014   Dyspnea 06/25/2014    ONSET DATE: 07/02/21 DOI  REFERRING DIAG: V69.450T (ICD-10-CM) - Displaced fracture of proximal phalanx of left little finger, initial encounter for closed fracture  THERAPY DIAG:  Pain in left hand  Stiffness of left hand, not elsewhere classified  Other lack of coordination  Localized edema  Muscle weakness (generalized)  Rationale for Evaluation and Treatment Rehabilitation  SUBJECTIVE:   SUBJECTIVE STATEMENT: She is not working, she states staying on her couch most of the time and she watches her mother who has some kind of dementia. She states her income is low and she needs disability.  She states falling in her brother's home, breaking her hand. She has breathing issues and states needing oxygen normally, (due to COPD, emphysema) but is not using any today. She states wanting a portable tank.    PERTINENT HISTORY: Per MD: "Left small and ring finger proximal phalangeal base fractures.  Non op in splint." "x-rays which show a displaced fracture of the left small finger proximal phalanx and a possible nondisplaced fracture of the base of the ring finger proximal phalanx."  PRECAUTIONS: ~2 weeks since fall and fx of left hand SF, RF P1. After 3-4 weeks (healing per MD) start mid-range AROM and blocking ex, wait 5-6 weeks before PROM and start weaning  orthosis for light ADLs as tolerated.   WEIGHT BEARING RESTRICTIONS Yes NWB left hand now  PAIN:  Are you having pain? Yes- in small finger 4/10 at rest now   FALLS: Has patient fallen in last 6 months? Yes. Number of falls 1 (this accident)  LIVING ENVIRONMENT: Lives with: her mother who she cares for   PLOF: Independent but with breathing issues   PATIENT GOALS Get hand feeling better and moving  OBJECTIVE:   HAND DOMINANCE: Right   ADLs: Overall ADLs: States decreased ability to grab, hold household objects, pain  and inability to open containers, perform all FMS tasks, etc.    FUNCTIONAL OUTCOME MEASURES: Eval: Quck DASH 55% impairment today   UPPER EXTREMITY ROM    Eval: wrist is somewhat tight today from immobilization, fingers are fairly straight passively, but now AROM allowed yet, so measures will be TBD.   Active ROM Left TBD  Wrist flexion   Wrist extension   Wrist ulnar deviation   Wrist radial deviation   Wrist pronation   Wrist supination   (Blank rows = not tested)  Active ROM Left TBD  Thumb MCP (0-60)   Thumb IP (0-80)   Thumb Radial abd/add (0-55)    Thumb Palmar abd/add (0-45)    Thumb Opposition to Small Finger    Ring MCP (0-90)    Ring PIP (0-100)    Ring DIP (0-70)    Little MCP (0-90)    Little PIP (0-100)    Little DIP (0-70)    (Blank rows = not tested)   UPPER EXTREMITY MMT:    Eval: TBD when safe  MMT Left eval  Wrist flexion   Wrist extension   Wrist ulnar deviation   Wrist radial deviation   Wrist pronation   Wrist supination   (Blank rows = not tested)  HAND FUNCTION: Eval: Grip strength Right: TBD lbs, Left: TBD lbs   COORDINATION: Eval: Box and Blocks Test: TBD Blocks today (TBD is [facility]); 9 Hole Peg Test Right: TBDsec, Left: TBD sec (TBD sec is WFL)   SENSATION: Eval: Light touch intact today  EDEMA:   Eval:  Mildly swollen in hand today  COGNITION: Overall cognitive status: WFL for evaluation today   OBSERVATIONS:   Eval: she seems to be doing well but shocked that she is NWB for several more weeks.   TODAY'S TREATMENT:  Eval:  Custom orthotic fabrication was indicated due to pt's healing SF fracture (and possible RF fx) and need for safe, functional positioning. OT fabricated custom hand-based SAFE position orthotic for pt today to immobilize healing non-op fxs. It fit well with no areas of pressure, pt states a comfortable fit. Pt was educated on the wearing schedule (ON ALL THE TIME), to call or come in ASAP if it is causing any  irritation or is not achieving desired function. It will be checked/adjusted in upcoming sessions, as needed. Pt states understanding.     PATIENT EDUCATION: Education details: See tx section above for details  Person educated: Patient Education method: Verbal Instruction, Teach back, Handouts  Education comprehension: States and demonstrates understanding, Additional Education required    HOME EXERCISE PROGRAM: See tx section above for details   GOALS: Goals reviewed with patient? Yes   SHORT TERM GOALS: (STG required if POC>30 days)  Pt will obtain protective, custom orthotic. Target date: 07/17/21 Goal status: MET  2.  Pt will demo/state understanding of initial HEP to improve pain levels and prerequisite motion. Target  date: 08/04/21 Goal status: INITIAL   LONG TERM GOALS:  Pt will improve functional ability by decreased impairment per Quick DASH assessment from 55% to 15% or better, for better quality of life. Target date: 09/08/21 Goal status: INITIAL  2.  Pt will improve grip strength in left hand to at least 40lbs for functional use at home and in IADLs. Target date: 09/08/21 Goal status: INITIAL  3.  Pt will improve A/ROM in left SF and RF TAM to at least 200* in each finger, to have functional motion for tasks like reach and grasp.  Target date: 09/08/21 Goal status: INITIAL  4.  Pt will improve strength in left wrist to at least 4+/5 MMT to have increased functional ability to carry out selfcare and higher-level homecare tasks with no difficulty. Target date: 09/08/21 Goal status: INITIAL   ASSESSMENT:  CLINICAL IMPRESSION: Patient is a 66 y.o. female who was seen today for occupational therapy evaluation for Lt SF and RF P1 fx non-op, and decreased safety and functional ability.    PERFORMANCE DEFICITS in functional skills including ADLs, IADLs, coordination, dexterity, edema, ROM, strength, pain, fascial restrictions, flexibility, [place], GMC, body mechanics,  endurance, and UE functional use, cognitive skills including problem solving and safety awareness, and psychosocial skills including coping strategies, environmental adaptation, habits, and routines and behaviors.   IMPAIRMENTS are limiting patient from ADLs, IADLs, rest and sleep, work, play, leisure, and social participation.   COMORBIDITIES has co-morbidities such as DM II, COPD, HTN, depression, and more  that affects occupational performance. Patient will benefit from skilled OT to address above impairments and improve overall function.  MODIFICATION OR ASSISTANCE TO COMPLETE EVALUATION: No modification of tasks or assist necessary to complete an evaluation.  OT OCCUPATIONAL PROFILE AND HISTORY: Detailed assessment: Review of records and additional review of physical, cognitive, psychosocial history related to current functional performance.  CLINICAL DECISION MAKING: Moderate - several treatment options, min-mod task modification necessary  REHAB POTENTIAL: Good  EVALUATION COMPLEXITY: Low      PLAN: OT FREQUENCY: 1-2x/week  OT DURATION: 8 weeks (through 09/08/21)  PLANNED INTERVENTIONS: self care/ADL training, therapeutic exercise, therapeutic activity, manual therapy, passive range of motion, splinting, electrical stimulation, ultrasound, fluidotherapy, compression bandaging, moist heat, cryotherapy, contrast bath, patient/family education, and coping strategies training  RECOMMENDED OTHER SERVICES: none now   CONSULTED AND AGREED WITH PLAN OF CARE: Patient  PLAN FOR NEXT SESSION: check orthotic, give HEP for fingers at 3-4 weeks out after MD f/u   Benito Mccreedy, OTR/L, CHT 07/17/2021, 6:44 PM

## 2021-07-24 ENCOUNTER — Other Ambulatory Visit: Payer: Medicare Other

## 2021-07-25 ENCOUNTER — Ambulatory Visit (INDEPENDENT_AMBULATORY_CARE_PROVIDER_SITE_OTHER): Payer: Medicare Other | Admitting: Orthopedic Surgery

## 2021-07-25 ENCOUNTER — Ambulatory Visit (INDEPENDENT_AMBULATORY_CARE_PROVIDER_SITE_OTHER): Payer: Medicare Other

## 2021-07-25 DIAGNOSIS — S62617A Displaced fracture of proximal phalanx of left little finger, initial encounter for closed fracture: Secondary | ICD-10-CM

## 2021-07-25 DIAGNOSIS — S62645A Nondisplaced fracture of proximal phalanx of left ring finger, initial encounter for closed fracture: Secondary | ICD-10-CM | POA: Diagnosis not present

## 2021-07-25 NOTE — Progress Notes (Signed)
Office Visit Note   Patient: [name]           Date of Birth: [birth year]           MRN: [id number] Visit Date: 07/25/2021              Requested by: Mack Hook, MD [place],  [place] [address] PCP: Mack Hook, MD   Assessment & Plan: Visit Diagnoses:  1. Displaced fracture of proximal phalanx of left little finger, initial encounter for closed fracture   2. Closed nondisplaced fracture of proximal phalanx of left ring finger, initial encounter     Plan: Patient is approximately 3 weeks out from her injury.  At her initial visit we discussed surgical treatment with closed duction percutaneous pinning versus nonoperative treatment with hand therapy arthroplasty brace.  She is elected for conservative management.  She is doing well today.  Repeat x-rays show essentially unchanged fracture alignment with mild apex volar angulation on the lateral view but no significant displacement or angulation on the AP view.  The ring finger proximal phalangeal base fracture remains nondisplaced.  Her pain seems to be improving.  She will continue with therapy to start working on range of motion of the fingers.  I can see her back in 3 to 4 weeks.  Follow-Up Instructions: No follow-ups on file.   Orders:  Orders Placed This Encounter  Procedures   XR Finger Little Left   No orders of the defined types were placed in this encounter.     Procedures: No procedures performed   Clinical Data: No additional findings.   Subjective: Chief Complaint  Patient presents with   Left Little Finger - Follow-up, Fracture    This is a 66 year old right-hand-dominant female presents for follow-up of a left small and ring finger proximal phalanx fracture.  She had a ground-level fall approximately 3 weeks ago.  She has elected to treat this conservatively.  She has been followed by hand therapy and had a Thermoplast splint made.  They have been working on edema control.   She notes that overall her pain has improved somewhat.    Review of Systems   Objective: Vital Signs: There were no vitals taken for this visit.  Physical Exam  Left Hand Exam   Tenderness  Left hand tenderness location: Mild TTP at ring and small finger proximal phalangeal bases.  Swelling is improved.   Other  Erythema: absent Sensation: normal Pulse: present  Comments:  ROM of ring and small finger limited by pain and stiffness.  No evidence of clinical malrotation with what limited motion she has.       Specialty Comments:  No specialty comments available.  Imaging: No results found.   PMFS History: Patient Active Problem List   Diagnosis Date Noted   Nondisplaced fracture of proximal phalanx of left ring finger 07/25/2021   Displaced fracture of proximal phalanx of left little finger, initial encounter for closed fracture 07/07/2021   Onychomycosis of toenail 03/22/2021   Dry skin dermatitis 03/22/2021   DM type 2 (diabetes mellitus, type 2) ([place]) 02/18/2021   COPD GOLD 0  11/08/2020   Depression 11/08/2020   Mixed hyperlipidemia 11/08/2020   Chronic respiratory failure with hypoxia ([place]) 10/17/2020   Mild sleep apnea 10/17/2020   Class 2 severe obesity due to excess calories with serious comorbidity and body mass index (BMI) of 35.0 to 35.9 in adult ([place]) 08/15/2014   Centrilobular emphysema ([place]) 06/26/2014  Primary hypertension 06/26/2014   Dyspnea 06/25/2014   Past Medical History:  Diagnosis Date   Anxiety    Asthma    Chest pain    COPD (chronic obstructive pulmonary disease) (HCC)    Depression    DM type 2 (diabetes mellitus, type 2) (HCC)    Hypertension    Pneumonia    Shortness of breath dyspnea     Family History  Problem Relation Age of Onset   Allergies Mother    Allergies Daughter    Asthma Son        had as a child    Past Surgical History:  Procedure Laterality Date   PARTIAL HYSTERECTOMY  2007   TEE WITHOUT  CARDIOVERSION N/A 08/24/2014   Procedure: TRANSESOPHAGEAL ECHOCARDIOGRAM (TEE);  Surgeon: Adrian Prows, MD;  Location[facility];  Service: Cardiovascular;  Laterality: N/A;   Social History   Occupational History   Not on file  Tobacco Use   Smoking status: Former    Packs/day: 1.00    Years: 38.00    Total pack years: 38.00    Types: Cigarettes    Quit date: 01/16/2008    Years since quitting: 13.5   Smokeless tobacco: Never  Vaping Use   Vaping Use: Never used  Substance and Sexual Activity   Alcohol use: No    Alcohol/week: 0.0 standard drinks of alcohol   Drug use: No   Sexual activity: Not on file

## 2021-07-26 ENCOUNTER — Ambulatory Visit (INDEPENDENT_AMBULATORY_CARE_PROVIDER_SITE_OTHER): Payer: Medicare Other | Admitting: Rehabilitative and Restorative Service Providers"

## 2021-07-26 ENCOUNTER — Encounter: Payer: Self-pay | Admitting: Rehabilitative and Restorative Service Providers"

## 2021-07-26 ENCOUNTER — Encounter: Payer: Medicare Other | Admitting: Internal Medicine

## 2021-07-26 DIAGNOSIS — M6281 Muscle weakness (generalized): Secondary | ICD-10-CM

## 2021-07-26 DIAGNOSIS — M79642 Pain in left hand: Secondary | ICD-10-CM | POA: Diagnosis not present

## 2021-07-26 DIAGNOSIS — R600 Localized edema: Secondary | ICD-10-CM

## 2021-07-26 DIAGNOSIS — R278 Other lack of coordination: Secondary | ICD-10-CM

## 2021-07-26 DIAGNOSIS — M25642 Stiffness of left hand, not elsewhere classified: Secondary | ICD-10-CM | POA: Diagnosis not present

## 2021-07-26 NOTE — Therapy (Addendum)
OUTPATIENT OCCUPATIONAL THERAPY TREATMENT & DISCHARGE NOTE   Patient Name: [name] MRN: [id number] DOB:1955-06-05, 66 y.o., female Today's Date: 07/26/2021  PCP: Dr. Mack Hook  REFERRING PROVIDER: Dr. Sherilyn Cooter   END OF SESSION:   OT End of Session - 07/26/21 1144     Visit Number 2    Number of Visits 14    Date for OT Re-Evaluation 09/08/21    Authorization Type [employer] medicare 60 OT/ PT visits    Authorization - Number of Visits 60    OT Start Time 1144    OT Stop Time 1224    OT Time Calculation (min) 40 min    Activity Tolerance Patient tolerated treatment well;No increased pain;Patient limited by fatigue;Patient limited by pain    Behavior During Therapy [facility] for tasks assessed/performed             Past Medical History:  Diagnosis Date   Anxiety    Asthma    Chest pain    COPD (chronic obstructive pulmonary disease) ([place])    Depression    DM type 2 (diabetes mellitus, type 2) ([place])    Hypertension    Pneumonia    Shortness of breath dyspnea    Past Surgical History:  Procedure Laterality Date   PARTIAL HYSTERECTOMY  2007   TEE WITHOUT CARDIOVERSION N/A 08/24/2014   Procedure: TRANSESOPHAGEAL ECHOCARDIOGRAM (TEE);  Surgeon: Adrian Prows, MD;  Location: [facility];  Service: Cardiovascular;  Laterality: N/A;   Patient Active Problem List   Diagnosis Date Noted   Nondisplaced fracture of proximal phalanx of left ring finger 07/25/2021   Displaced fracture of proximal phalanx of left little finger, initial encounter for closed fracture 07/07/2021   Onychomycosis of toenail 03/22/2021   Dry skin dermatitis 03/22/2021   DM type 2 (diabetes mellitus, type 2) ([place]) 02/18/2021   COPD GOLD 0  11/08/2020   Depression 11/08/2020   Mixed hyperlipidemia 11/08/2020   Chronic respiratory failure with hypoxia ([place]) 10/17/2020   Mild sleep apnea 10/17/2020   Class 2 severe obesity due to excess calories with serious comorbidity and body mass index (BMI)  of 35.0 to 35.9 in adult ([place]) 08/15/2014   Centrilobular emphysema ([place]) 06/26/2014   Primary hypertension 06/26/2014   Dyspnea 06/25/2014    ONSET DATE: 07/02/21 DOI   REFERRING DIAG: G26.948N (ICD-10-CM) - Displaced fracture of proximal phalanx of left little finger, initial encounter for closed fracture  THERAPY DIAG:  Pain in left hand  Stiffness of left hand, not elsewhere classified  Other lack of coordination  Muscle weakness (generalized)  Localized edema  Rationale for Evaluation and Treatment Rehabilitation  PERTINENT HISTORY: Per MD: "Left small and ring finger proximal phalangeal base fractures.  Non op in splint." "x-rays which show a displaced fracture of the left small finger proximal phalanx and a possible nondisplaced fracture of the base of the ring finger proximal phalanx."   PRECAUTIONS: 3+ weeks since fall and fx of left hand SF, RF P1. After 3-4 weeks (healing per MD) start mid-range AROM and blocking ex, wait 5-6 weeks before PROM and start weaning orthosis for light ADLs as tolerated.    WEIGHT BEARING RESTRICTIONS Yes NWB left hand now  SUBJECTIVE:  She states orthotic is inconvenient and stops her from using her hand, but it is not rubbing or hurting her. OT explains again that her bones are broken and she should not be using her hand at all.   PAIN:  Are you having pain?  Rating: 3/10 at rest now    OBJECTIVE: (All objective assessments below are from initial evaluation on: 07/17/21 unless otherwise specified.)   HAND DOMINANCE: Right    ADLs: Overall ADLs: States decreased ability to grab, hold household objects, pain and inability to open containers, perform all FMS tasks, etc.      FUNCTIONAL OUTCOME MEASURES: Eval: Quck DASH 55% impairment today    UPPER EXTREMITY ROM    Eval: wrist is somewhat tight today from immobilization, fingers are fairly straight passively, but now AROM allowed yet, so measures will be TBD.    Active ROM  Left 07/26/21  Wrist flexion  27  Wrist extension  18  Wrist ulnar deviation    Wrist radial deviation    Wrist pronation    Wrist supination    (Blank rows = not tested)   Active ROM Left 07/26/21  Thumb MCP (0-60)    Thumb IP (0-80)    Thumb Radial abd/add (0-55)    Thumb Palmar abd/add (0-45)    Thumb Opposition to Small Finger    Ring MCP (0-90)  0-17  Ring PIP (0-100)  0-46  Ring DIP (0-70)  0-10  Little MCP (0-90)  0-27  Little PIP (0-100)  0- 31  Little DIP (0-70)  0 -5   (Blank rows = not tested)     UPPER EXTREMITY MMT:    Eval: TBD when safe  MMT Left eval  Wrist flexion    Wrist extension    Wrist ulnar deviation    Wrist radial deviation    Wrist pronation    Wrist supination    (Blank rows = not tested)   HAND FUNCTION: Eval: Grip strength Right: TBD lbs, Left: TBD lbs    COORDINATION: Eval: Box and Blocks Test: TBD Blocks today (TBD is [facility]); 9 Hole Peg Test Right: TBDsec, Left: TBD sec (TBD sec is WFL)    SENSATION: Eval: Light touch intact today   EDEMA:             Eval:  Mildly swollen in hand today   COGNITION: Overall cognitive status: WFL for evaluation today    OBSERVATIONS:             Eval: she seems to be doing well but shocked that she is NWB for several more weeks.    TODAY'S TREATMENT:  07/26/21: Due to MD and imaging both finding some interval healing, she is ok to do mid-range, light AROM now as tolerated. She tries this wih OT for new measures, has some discomfort (states pain up to 4-10), and is edu to not do forcefully and to stop if pain is 5+/10.  She states understanding. She is also given HEP for shoulder, FA, wrist motion as she is stiff at wrist today and this could be due to swelling but more likely disuse.  HEP as below. She demo's back with no significant issues.  She is also reminded for safety to not use hand in ADLs yet. Rest is priority.   Exercises BRACE ON:  - "Raise the Roof"  - 3-4 x daily - 10-15 reps - Palm  Up / Palm Down  - 3-4 x daily - 10-15 reps - Bend and Pull Back Wrist SLOWLY  - 3-4 x daily - 10-15 reps - "Windshield Wipers"   - 3-4 x daily - 10-15 reps - Seated Thumb Composite Flexion AROM  - 3-4 x daily - 10-15 reps Brace OFF as tolerated:  - Half Fist  AROM  - 3-4 x daily - 10 reps   Eval:  Custom orthotic fabrication was indicated due to pt's healing SF fracture (and possible RF fx) and need for safe, functional positioning. OT fabricated custom hand-based SAFE position orthotic for pt today to immobilize healing non-op fxs. It fit well with no areas of pressure, pt states a comfortable fit. Pt was educated on the wearing schedule (ON ALL THE TIME), to call or come in ASAP if it is causing any irritation or is not achieving desired function. It will be checked/adjusted in upcoming sessions, as needed. Pt states understanding.        PATIENT EDUCATION: Education details: See tx section above for details  Person educated: Patient Education method: Verbal Instruction, Teach back, Handouts  Education comprehension: States and demonstrates understanding, Additional Education required      HOME EXERCISE PROGRAM: Access Code: [email or web address] URL: [email or web address] Date: 07/26/2021 Prepared by: Benito Mccreedy    GOALS: Goals reviewed with patient? Yes     SHORT TERM GOALS: (STG required if POC>30 days)   Pt will obtain protective, custom orthotic. Target date: 07/17/21 Goal status: MET   2.  Pt will demo/state understanding of initial HEP to improve pain levels and prerequisite motion. Target date: 08/04/21 Goal status: INITIAL     LONG TERM GOALS:   Pt will improve functional ability by decreased impairment per Quick DASH assessment from 55% to 15% or better, for better quality of life. Target date: 09/08/21 Goal status: INITIAL   2.  Pt will improve grip strength in left hand to at least 40lbs for functional use at home and in IADLs. Target date: 09/08/21 Goal  status: INITIAL   3.  Pt will improve A/ROM in left SF and RF TAM to at least 200* in each finger, to have functional motion for tasks like reach and grasp.  Target date: 09/08/21 Goal status: INITIAL   4.  Pt will improve strength in left wrist to at least 4+/5 MMT to have increased functional ability to carry out selfcare and higher-level homecare tasks with no difficulty. Target date: 09/08/21 Goal status: INITIAL     ASSESSMENT:   CLINICAL IMPRESSION: 07/26/21: She has some confusion about not using her hand, doesn't seem to understand healing process and is urged to rest hand and not push motion.    Eval: Patient is a 66 y.o. female who was seen today for occupational therapy evaluation for Lt SF and RF P1 fx non-op, and decreased safety and functional ability.        PLAN: OT FREQUENCY: D/C OT DURATION:  D/C   PLANNED INTERVENTIONS: self care/ADL training, therapeutic exercise, therapeutic activity, manual therapy, passive range of motion, splinting, electrical stimulation, ultrasound, fluidotherapy, compression bandaging, moist heat, cryotherapy, contrast bath, patient/family education, and coping strategies training   RECOMMENDED OTHER SERVICES: none now    CONSULTED AND AGREED WITH PLAN OF CARE: Patient   PLAN FOR NEXT SESSION:  She was asked to come in next week if she has any pain or problems, otherwise to work on HEP until the week of the 23rd of July and return to therapy that week for HEP upgrades and new measures. (Will be 5 weeks out and can upgrade to AA/P/ROM as tolerated per protocols and start weaning orthotic for light ADLs.  Also consider trimming down to finger-based orthotic. (This was not trimmed today out of safety as she states trying to use hand for ADLs too soon.)   Benito Mccreedy, OTR/L, CHT  07/26/2021, 12:41 PM     OCCUPATIONAL THERAPY DISCHARGE SUMMARY  Visits from Start of Care: 2  The pt did not return to therapy, and lost contact with therapy  team.  She did f/u with MD who discharged her.  Therapy goals could not be finalized. Please see notes for details. She is d/c at this point per policies.   Benito Mccreedy, OTR/L, CHT 10/05/21

## 2021-07-27 ENCOUNTER — Encounter: Payer: Self-pay | Admitting: Family

## 2021-07-27 ENCOUNTER — Ambulatory Visit (INDEPENDENT_AMBULATORY_CARE_PROVIDER_SITE_OTHER): Payer: Medicare Other | Admitting: Family

## 2021-07-27 VITALS — BP 150/80 | HR 91 | Temp 97.0°F | Resp 18 | Ht 67.0 in | Wt 221.0 lb

## 2021-07-27 DIAGNOSIS — E785 Hyperlipidemia, unspecified: Secondary | ICD-10-CM

## 2021-07-27 DIAGNOSIS — I10 Essential (primary) hypertension: Secondary | ICD-10-CM | POA: Diagnosis not present

## 2021-07-27 DIAGNOSIS — E782 Mixed hyperlipidemia: Secondary | ICD-10-CM | POA: Diagnosis not present

## 2021-07-27 DIAGNOSIS — Z7689 Persons encountering health services in other specified circumstances: Secondary | ICD-10-CM

## 2021-07-27 DIAGNOSIS — J432 Centrilobular emphysema: Secondary | ICD-10-CM

## 2021-07-27 DIAGNOSIS — E1169 Type 2 diabetes mellitus with other specified complication: Secondary | ICD-10-CM

## 2021-07-27 NOTE — Progress Notes (Incomplete)
Provider: Bradlee Heitman FNP-C   Mack Hook, MD  Patient Care Team: Mack Hook, MD as PCP - General (Internal Medicine)  Extended Emergency Contact Information Primary Emergency Contact: [facility] Address: [address]          [place] [address] Johnnette Litter of [place] Phone: [phone] Relation: Mother  Code Status:  Full Code  Goals of care: Advanced Directive information    07/27/2021    2:00 PM  Advanced Directives  Does Patient Have a Medical Advance Directive? No  Would patient like information on creating a medical advance directive? No - Patient declined     Chief Complaint  Patient presents with  . Establish Care    New Patient.    HPI:  Pt is a 65 y.o. female seen today     Past Medical History:  Diagnosis Date  . Anxiety   . Asthma   . Chest pain   . COPD (chronic obstructive pulmonary disease) ([place])   . Depression   . DM type 2 (diabetes mellitus, type 2) ([place])   . Hypertension   . Pneumonia   . Shortness of breath dyspnea    Past Surgical History:  Procedure Laterality Date  . PARTIAL HYSTERECTOMY  2007  . TEE WITHOUT CARDIOVERSION N/A 08/24/2014   Procedure: TRANSESOPHAGEAL ECHOCARDIOGRAM (TEE);  Surgeon: Adrian Prows, MD;  Location: [facility] ENDOSCOPY;  Service: Cardiovascular;  Laterality: N/A;    Allergies  Allergen Reactions  . Flonase [Fluticasone Propionate]     Nose bleeds    Allergies as of 07/27/2021       Reactions   Flonase [fluticasone Propionate]    Nose bleeds        Medication List        Accurate as of July 27, 2021  2:31 PM. If you have any questions, ask your nurse or doctor.          acetaminophen 325 MG tablet Commonly known as: TYLENOL Take 650 mg by mouth every 6 (six) hours as needed.   amLODipine 5 MG tablet Commonly known as: NORVASC Take 1 tablet (5 mg total) by mouth daily.   aspirin 81 MG tablet Take 81 mg by mouth daily.   atorvastatin 80 MG tablet Commonly  known as: LIPITOR Take 1 tablet (80 mg total) by mouth daily.   carvedilol 12.5 MG tablet Commonly known as: COREG TAKE 1 TABLET BY MOUTH  TWICE DAILY WITH MEALS   esomeprazole 40 MG capsule Commonly known as: NEXIUM TAKE 1 CAPSULE BY MOUTH DAILY   FLUoxetine 40 MG capsule Commonly known as: PROZAC Take 1 capsule (40 mg total) by mouth daily.   FreeStyle Libre 14 Day Sensor Misc Check blood glucose 3 times daily before meals and as needed   linaclotide 145 MCG Caps capsule Commonly known as: Linzess Take 1 capsule (145 mcg total) by mouth daily before breakfast.   lisinopril-hydrochlorothiazide 20-12.5 MG tablet Commonly known as: ZESTORETIC Take 1 tablet by mouth daily.   metFORMIN 500 MG 24 hr tablet Commonly known as: Glucophage XR 1 tab by mouth twice daily with meals   montelukast 10 MG tablet Commonly known as: SINGULAIR Take 1 tablet (10 mg total) by mouth at bedtime.   nitroGLYCERIN 0.3 MG SL tablet Commonly known as: NITROSTAT Place 0.3 mg under the tongue every 5 (five) minutes as needed for chest pain (may repeat x3).   ProAir HFA 108 (90 Base) MCG/ACT inhaler Generic drug: albuterol Inhale 1-2 puffs into the lungs every 6 (  six) hours as needed for wheezing or shortness of breath.   tiZANidine 2 MG tablet Commonly known as: ZANAFLEX Take 2 mg by mouth every 6 (six) hours as needed.   traZODone 50 MG tablet Commonly known as: DESYREL Take 1 tablet (50 mg total) by mouth at bedtime as needed.   Trelegy Ellipta 100-62.5-25 MCG/ACT Aepb Generic drug: Fluticasone-Umeclidin-Vilant USE 1 INHALATION BY MOUTH  ONCE DAILY AT THE SAME TIME EACH DAY   Trelegy Ellipta 100-62.5-25 MCG/ACT Aepb Generic drug: Fluticasone-Umeclidin-Vilant Inhale 1 puff into the lungs daily.   VICKS VAPORUB EX Apply 1 application topically 2 (two) times daily as needed (breathe and soreness).        Review of Systems  Immunization History  Administered Date(s) Administered   . Fluad Quad(high Dose 65+) 10/17/2020  . Influenza,inj,Quad PF,6+ Mos 10/30/2019  . Influenza-Unspecified 10/30/2019  . PFIZER(Purple Top)SARS-COV-2 Vaccination 04/09/2019, 05/08/2019  . Pension scheme manager 68yr & up 11/08/2020  . Pneumococcal Conjugate-13 02/16/2020  . Tdap 03/22/2021  . Zoster Recombinat (Shingrix) 11/08/2020, 03/22/2021   Pertinent  Health Maintenance Due  Topic Date Due  . FOOT EXAM  Never done  . OPHTHALMOLOGY EXAM  Never done  . COLONOSCOPY (Pts 45-464yrInsurance coverage will need to be confirmed)  Never done  . MAMMOGRAM  06/04/2015  . DEXA SCAN  Never done  . INFLUENZA VACCINE  08/15/2021  . HEMOGLOBIN A1C  09/29/2021      09/27/2020    2:01 PM 07/04/2021    3:02 PM 07/27/2021    1:59 PM  Fall Risk  Falls in the past year? 1  1  Was there an injury with Fall? 0  1  Fall Risk Category Calculator 1  2  Fall Risk Category Low  Moderate  Patient Fall Risk Level Low fall risk Low fall risk Moderate fall risk  Patient at Risk for Falls Due to   History of fall(s);Impaired balance/gait;Impaired mobility  Fall risk Follow up   Falls evaluation completed;Education provided;Falls prevention discussed   Functional Status Survey:    Vitals:   07/27/21 1350  BP: (!) 150/80  Pulse: 91  Resp: 18  Temp: (!) 97 F (36.1 C)  SpO2: 97%  Weight: 221 lb (100.2 kg)  Height: '5\' 7"'$  (1.702 m)   Body mass index is 34.61 kg/m. Physical Exam  Labs reviewed: Recent Labs    11/08/20 1509 03/29/21 0000  NA 140 139  K 4.2 4.0  CL 106 105  CO2 22 22  GLUCOSE 126* 148*  BUN 12 12  CREATININE 0.67 0.75  CALCIUM 10.9* 10.6*   Recent Labs    11/08/20 1509 03/29/21 0000  AST 18 22  ALT 23 27  ALKPHOS 70 66  BILITOT 0.3 0.4  PROT 7.1 6.5  ALBUMIN 4.2 3.8   Recent Labs    11/08/20 1509  WBC 5.8  NEUTROABS 3.3  HGB 13.9  HCT 41.8  MCV 90  PLT 217   No results found for: "TSH" Lab Results  Component Value Date   HGBA1C  7.0 (H) 03/29/2021   Lab Results  Component Value Date   CHOL 166 03/29/2021   HDL 51 03/29/2021   LDLCALC 102 (H) 03/29/2021   TRIG 70 03/29/2021    Significant Diagnostic Results in last 30 days:  XR Finger Little Left  Result Date: 07/25/2021 Mulltiple views of the left hand taken today reviewed interpreted by me.  Demonstrate essentially unchanged alignment of the small and ring finger proximal  phalangeal base fractures.  There is mild apex volar angulation of the small finger on the lateral view.  The ring finger remains essentially nondisplaced.  DG Hand Complete Left  Result Date: 07/04/2021 CLINICAL DATA:  [place] 2 days ago on outstretched hand. Pain and swelling of the mid metacarpophalangeal areas. Radial sided wrist pain. EXAM: LEFT WRIST - COMPLETE 3+ VIEW; LEFT HAND - COMPLETE 3+ VIEW COMPARISON:  None Available. FINDINGS: Left wrist: Minimal 1 mm ulnar positive variance. Mild triscaphe and moderate thumb carpometacarpal joint space narrowing and peripheral osteophytosis. Mild rest of the intercarpal joint space narrowing diffusely. Left hand: There is diffuse decreased bone mineralization. Artificial nails are incidentally noted. Rings overlie the proximal phalanges of the third and fourth fingers. There is curvilinear lucency indicating an acute fracture of the proximal metaphysis of the proximal phalanx of the fifth finger with minimal volar apex angulation but otherwise no significant displacement. Mild joint space narrowing of the interphalangeal joints diffusely. IMPRESSION: 1. Acute fracture of the proximal metaphysis of the proximal phalanx of fifth finger with minimal volar apex angulation but otherwise no significant displacement. 2. 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 Electronically Signed   By: Yvonne Kendall M.D.   On: 07/04/2021 15:53   DG Wrist Complete Left  Result Date: 07/04/2021 CLINICAL DATA:  [place] 2 days ago on outstretched hand. Pain  and swelling of the mid metacarpophalangeal areas. Radial sided wrist pain. EXAM: LEFT WRIST - COMPLETE 3+ VIEW; LEFT HAND - COMPLETE 3+ VIEW COMPARISON:  None Available. FINDINGS: Left wrist: Minimal 1 mm ulnar positive variance. Mild triscaphe and moderate thumb carpometacarpal joint space narrowing and peripheral osteophytosis. Mild rest of the intercarpal joint space narrowing diffusely. Left hand: There is diffuse decreased bone mineralization. Artificial nails are incidentally noted. Rings overlie the proximal phalanges of the third and fourth fingers. There is curvilinear lucency indicating an acute fracture of the proximal metaphysis of the proximal phalanx of the fifth finger with minimal volar apex angulation but otherwise no significant displacement. Mild joint space narrowing of the interphalangeal joints diffusely. IMPRESSION: 1. Acute fracture of the proximal metaphysis of the proximal phalanx of fifth finger with minimal volar apex angulation but otherwise no significant displacement. 2. 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 Electronically Signed   By: Yvonne Kendall M.D.   On: 07/04/2021 15:53    Assessment/Plan There are no diagnoses linked to this encounter.   Family/ staff Communication: Reviewed plan of care with patient  Labs/tests ordered: None   Next Appointment :   Sandrea Hughs, NP

## 2021-07-27 NOTE — Patient Instructions (Signed)
Schedule follow up in 6 months  Schedule lab visit for fasting blood work next week- you may only have coffee (black) and water prior to visit

## 2021-07-28 ENCOUNTER — Other Ambulatory Visit (INDEPENDENT_AMBULATORY_CARE_PROVIDER_SITE_OTHER): Payer: Self-pay | Admitting: Primary Care

## 2021-07-28 DIAGNOSIS — R0602 Shortness of breath: Secondary | ICD-10-CM

## 2021-07-28 MED ORDER — ALBUTEROL SULFATE HFA 108 (90 BASE) MCG/ACT IN AERS: 1.0000 | INHALATION_SPRAY | Freq: Four times a day (QID) | RESPIRATORY_TRACT | 2 refills | Status: DC | PRN

## 2021-08-03 ENCOUNTER — Other Ambulatory Visit: Payer: Medicare Other

## 2021-08-04 LAB — CBC WITH DIFFERENTIAL/PLATELET
Absolute Monocytes: 458 cells/uL (ref 200–950)
Basophils Absolute: 31 cells/uL (ref 0–200)
Basophils Relative: 0.6 %
Eosinophils Absolute: 109 cells/uL (ref 15–500)
Eosinophils Relative: 2.1 %
HCT: 43.6 % (ref 35.0–45.0)
Hemoglobin: 14.3 g/dL (ref 11.7–15.5)
Lymphs Abs: 1617 cells/uL (ref 850–3900)
MCH: 29.9 pg (ref 27.0–33.0)
MCHC: 32.8 g/dL (ref 32.0–36.0)
MCV: 91.2 fL (ref 80.0–100.0)
MPV: 12.3 fL (ref 7.5–12.5)
Monocytes Relative: 8.8 %
Neutro Abs: 2985 cells/uL (ref 1500–7800)
Neutrophils Relative %: 57.4 %
Platelets: 219 10*3/uL (ref 140–400)
RBC: 4.78 10*6/uL (ref 3.80–5.10)
RDW: 12.4 % (ref 11.0–15.0)
Total Lymphocyte: 31.1 %
WBC: 5.2 10*3/uL (ref 3.8–10.8)

## 2021-08-04 LAB — HEMOGLOBIN A1C
Hgb A1c MFr Bld: 6.4 % of total Hgb — ABNORMAL HIGH (ref <5.7)
Mean Plasma Glucose: 137 mg/dL
eAG (mmol/L): 7.6 mmol/L

## 2021-08-04 LAB — COMPLETE METABOLIC PANEL WITH GFR
AG Ratio: 1.5 (calc) (ref 1.0–2.5)
ALT: 30 U/L — ABNORMAL HIGH (ref 6–29)
AST: 22 U/L (ref 10–35)
Albumin: 4 g/dL (ref 3.6–5.1)
Alkaline phosphatase (APISO): 59 U/L (ref 37–153)
BUN: 10 mg/dL (ref 7–25)
CO2: 23 mmol/L (ref 20–32)
Calcium: 10.7 mg/dL — ABNORMAL HIGH (ref 8.6–10.4)
Chloride: 107 mmol/L (ref 98–110)
Creat: 0.58 mg/dL (ref 0.50–1.05)
Globulin: 2.7 g/dL (calc) (ref 1.9–3.7)
Glucose, Bld: 97 mg/dL (ref 65–99)
Potassium: 3.8 mmol/L (ref 3.5–5.3)
Sodium: 141 mmol/L (ref 135–146)
Total Bilirubin: 0.4 mg/dL (ref 0.2–1.2)
Total Protein: 6.7 g/dL (ref 6.1–8.1)
eGFR: 100 mL/min/{1.73_m2} (ref >=60)

## 2021-08-04 LAB — TSH: TSH: 1.53 mIU/L (ref 0.40–4.50)

## 2021-08-04 LAB — LIPID PANEL
Cholesterol: 125 mg/dL (ref <200)
HDL: 50 mg/dL (ref >=50)
LDL Cholesterol (Calc): 62 mg/dL (calc)
Non-HDL Cholesterol (Calc): 75 mg/dL (calc) (ref <130)
Total CHOL/HDL Ratio: 2.5 (calc) (ref <5.0)
Triglycerides: 57 mg/dL (ref <150)

## 2021-08-06 NOTE — Progress Notes (Signed)
Provider: Marlowe Sax FNP-C   Ceejay Kegley, Nelda Bucks, NP  Patient Care Team: Caton Popowski, Nelda Bucks, NP as PCP - General (Family Medicine)  Extended Emergency Contact Information Primary Emergency Contact: Thornton,Mary Address: [place] [address] [address] [place] Phone: [phone] Relation: Mother  Code Status:  Full Code  Goals of care: Advanced Directive information    07/27/2021    2:00 PM  Advanced Directives  Does Patient Have a Medical Advance Directive? No  Would patient like information on creating a medical advance directive? No - Patient declined     Chief Complaint  Patient presents with   Establish Care    New Patient.    HPI:  Pt is a 66 y.o. female seen today establish care here at [facility] for medical management of chronic diseases.  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  She denies any acute issues this visit. States gait unsteady but walks with a walker. Off oxygen during the visit states gets short of breath with exertion.She denies any fever,chills,cough,fatigue,chest tightness,chest pain or palpitation.  Blood sugars ranges in 80s to 110's has had a few readings in the 50s if she skips meals.   Past Medical History:  Diagnosis Date   Anxiety    Asthma    Chest pain    COPD (chronic obstructive pulmonary disease) ([place])    Depression    DM type 2 (diabetes mellitus, type 2) ([place])    Hypertension    Pneumonia    Shortness of breath dyspnea    Past Surgical History:  Procedure Laterality Date   PARTIAL HYSTERECTOMY  2007   TEE WITHOUT CARDIOVERSION N/A 08/24/2014   Procedure: TRANSESOPHAGEAL ECHOCARDIOGRAM (TEE);  Surgeon: Adrian Prows, MD;  Location: [facility] ENDOSCOPY;  Service: Cardiovascular;  Laterality: N/A;     Allergies  Allergen Reactions   Flonase [Fluticasone Propionate]     Nose bleeds    Allergies as of 07/27/2021       Reactions   Flonase [fluticasone Propionate]    Nose bleeds        Medication List        Accurate as of July 27, 2021 11:59 PM. If you have any questions, ask your nurse or doctor.          acetaminophen 325 MG tablet Commonly known as: TYLENOL Take 650 mg by mouth every 6 (six) hours as needed.   amLODipine 5 MG tablet Commonly known as: NORVASC Take 1 tablet (5 mg total) by mouth daily.   aspirin 81 MG tablet Take 81 mg by mouth daily.   atorvastatin 80 MG tablet Commonly known as: LIPITOR Take 1 tablet (80 mg total) by mouth daily.   carvedilol 12.5 MG tablet Commonly known as: COREG TAKE 1 TABLET BY MOUTH  TWICE DAILY WITH MEALS   esomeprazole 40 MG capsule Commonly known as: NEXIUM TAKE 1 CAPSULE BY MOUTH DAILY   FLUoxetine 40 MG capsule Commonly known as: PROZAC Take 1 capsule (40 mg total) by mouth daily.   FreeStyle Libre 14 Day Sensor Misc Check blood glucose 3 times daily before meals and as needed   linaclotide 145 MCG Caps capsule Commonly known as: Linzess Take 1 capsule (145 mcg total) by mouth daily before breakfast.   lisinopril-hydrochlorothiazide 20-12.5 MG  tablet Commonly known as: ZESTORETIC Take 1 tablet by mouth daily.   metFORMIN 500 MG 24 hr tablet Commonly known as: Glucophage XR 1 tab by mouth twice daily with meals   montelukast 10 MG tablet Commonly known as: SINGULAIR Take 1 tablet (10 mg total) by mouth at bedtime.   nitroGLYCERIN 0.3 MG SL tablet Commonly known as: NITROSTAT Place 0.3 mg under the tongue every 5 (five) minutes as needed for chest pain (may repeat x3).   ProAir HFA 108 (90 Base) MCG/ACT inhaler Generic drug: albuterol Inhale 1-2 puffs into the lungs every 6 (six) hours as needed for wheezing or shortness of breath.   tiZANidine 2 MG tablet Commonly known as:  ZANAFLEX Take 2 mg by mouth every 6 (six) hours as needed.   traZODone 50 MG tablet Commonly known as: DESYREL Take 1 tablet (50 mg total) by mouth at bedtime as needed.   Trelegy Ellipta 100-62.5-25 MCG/ACT Aepb Generic drug: Fluticasone-Umeclidin-Vilant USE 1 INHALATION BY MOUTH  ONCE DAILY AT THE SAME TIME EACH DAY   Trelegy Ellipta 100-62.5-25 MCG/ACT Aepb Generic drug: Fluticasone-Umeclidin-Vilant Inhale 1 puff into the lungs daily.   VICKS VAPORUB EX Apply 1 application topically 2 (two) times daily as needed (breathe and soreness).        Review of Systems  Constitutional:  Negative for appetite change, chills, fatigue, fever and unexpected weight change.  HENT:  Negative for congestion, dental problem, ear discharge, ear pain, facial swelling, hearing loss, nosebleeds, postnasal drip, rhinorrhea, sinus pressure, sinus pain, sneezing, sore throat, tinnitus and trouble swallowing.   Eyes:  Negative for pain, discharge, redness, itching and visual disturbance.  Respiratory:  Negative for cough, chest tightness, shortness of breath and wheezing.   Cardiovascular:  Negative for chest pain, palpitations and leg swelling.  Gastrointestinal:  Negative for abdominal distention, abdominal pain, blood in stool, constipation, diarrhea, nausea and vomiting.  Endocrine: Negative for cold intolerance, heat intolerance, polydipsia, polyphagia and polyuria.  Genitourinary:  Negative for difficulty urinating, dysuria, flank pain, frequency and urgency.  Musculoskeletal:  Positive for gait problem. Negative for arthralgias, back pain, joint swelling, myalgias, neck pain and neck stiffness.  Skin:  Negative for color change, pallor, rash and wound.  Neurological:  Negative for dizziness, syncope, speech difficulty, weakness, light-headedness, numbness and headaches.  Hematological:  Does not bruise/bleed easily.  Psychiatric/Behavioral:  Negative for agitation, behavioral problems, confusion,  hallucinations, self-injury, sleep disturbance and suicidal ideas. The patient is not nervous/anxious.     Immunization History  Administered Date(s) Administered   Fluad Quad(high Dose 65+) 10/17/2020   Influenza,inj,Quad PF,6+ Mos 10/30/2019   Influenza-Unspecified 10/30/2019   PFIZER(Purple Top)SARS-COV-2 Vaccination 04/09/2019, 05/08/2019   Pfizer Covid-19 Vaccine Bivalent Booster 50yr & up 11/08/2020   Pneumococcal Conjugate-13 02/16/2020   Tdap 03/22/2021   Zoster Recombinat (Shingrix) 11/08/2020, 03/22/2021   Pertinent  Health Maintenance Due  Topic Date Due   FOOT EXAM  Never done   OPHTHALMOLOGY EXAM  Never done   COLONOSCOPY (Pts 45-49yrInsurance coverage will need to be confirmed)  Never done   MAMMOGRAM  06/04/2015   DEXA SCAN  Never done   INFLUENZA VACCINE  08/15/2021   HEMOGLOBIN A1C  02/03/2022      09/27/2020    2:01 PM 07/04/2021    3:02 PM 07/27/2021    1:59 PM  [place] the past year? 1  1  Was there an injury with Fall? 0  1  Fall Risk Category Calculator 1  2  Fall Risk Category Low  Moderate  Patient Fall Risk Level Low fall risk Low fall risk Moderate fall risk  Patient at Risk for Falls Due to   History of fall(s);Impaired balance/gait;Impaired mobility  Fall risk Follow up   Falls evaluation completed;Education provided;Falls prevention discussed   Functional Status Survey:    Vitals:   07/27/21 1350  BP: (!) 150/80  Pulse: 91  Resp: 18  Temp: (!) 97 F (36.1 C)  SpO2: 97%  Weight: 221 lb (100.2 kg)  Height: _0  (1.702 m)   Body mass index is 34.61 kg/m. Physical Exam Vitals reviewed.  Constitutional:      General: She is not in acute distress.    Appearance: Normal appearance. She is normal weight. She is not ill-appearing or diaphoretic.  HENT:     Head: Normocephalic.     Right Ear: Tympanic membrane, ear canal and external ear normal. There is no impacted cerumen.     Left Ear: Tympanic membrane, ear canal and  external ear normal. There is no impacted cerumen.     Nose: Nose normal. No congestion or rhinorrhea.     Mouth/Throat:     Mouth: Mucous membranes are moist.     Pharynx: Oropharynx is clear. No oropharyngeal exudate or posterior oropharyngeal erythema.  Eyes:     General: No scleral icterus.       Right eye: No discharge.        Left eye: No discharge.     Extraocular Movements: Extraocular movements intact.     Conjunctiva/sclera: Conjunctivae normal.     Pupils: Pupils are equal, round, and reactive to light.  Neck:     Vascular: No carotid bruit.  Cardiovascular:     Rate and Rhythm: Normal rate and regular rhythm.     Pulses: Normal pulses.     Heart sounds: Normal heart sounds. No murmur heard.    No friction rub. No gallop.  Pulmonary:     Effort: Pulmonary effort is normal. No respiratory distress.     Breath sounds: Normal breath sounds. No wheezing, rhonchi or rales.  Chest:     Chest wall: No tenderness.  Abdominal:     General: Bowel sounds are normal. There is no distension.     Palpations: Abdomen is soft. There is no mass.     Tenderness: There is no abdominal tenderness. There is no right CVA tenderness, left CVA tenderness, guarding or rebound.  Musculoskeletal:        General: No swelling or tenderness. Normal range of motion.     Cervical back: Normal range of motion. No rigidity or tenderness.     Right lower leg: No edema.     Left lower leg: No edema.  Lymphadenopathy:     Cervical: No cervical adenopathy.  Skin:    General: Skin is warm and dry.     Coloration: Skin is not pale.     Findings: No bruising, erythema, lesion or rash.  Neurological:     Mental Status: She is alert and oriented to person, place, and time.     Cranial Nerves: No cranial nerve deficit.     Sensory: No sensory deficit.     Motor: No weakness.     Coordination: Coordination normal.     Gait: Gait normal.  Psychiatric:        Mood and Affect: Mood normal.        Speech:  Speech normal.  Behavior: Behavior normal.        Thought Content: Thought content normal.        Judgment: Judgment normal.     Labs reviewed: Recent Labs    11/08/20 1509 03/29/21 0000 08/03/21 0917  NA 140 139 141  K 4.2 4.0 3.8  CL 106 105 107  CO2 _0 GLUCOSE 126* 148* 97  BUN _1 CREATININE 0.67 0.75 0.58  CALCIUM 10.9* 10.6* 10.7*   Recent Labs    11/08/20 1509 03/29/21 0000 08/03/21 0917  AST _2 ALT 23 27 30*  ALKPHOS 70 66  --   BILITOT 0.3 0.4 0.4  PROT 7.1 6.5 6.7  ALBUMIN 4.2 3.8  --    Recent Labs    11/08/20 1509 08/03/21 0917  WBC 5.8 5.2  NEUTROABS 3.3 2,985  HGB 13.9 14.3  HCT 41.8 43.6  MCV 90 91.2  PLT 217 219   Lab Results  Component Value Date   TSH 1.53 08/03/2021   Lab Results  Component Value Date   HGBA1C 6.4 (H) 08/03/2021   Lab Results  Component Value Date   CHOL 125 08/03/2021   HDL 50 08/03/2021   LDLCALC 62 08/03/2021   TRIG 57 08/03/2021   CHOLHDL 2.5 08/03/2021    Significant Diagnostic Results in last 30 days:  XR Finger Little Left  Result Date: 07/25/2021 Mulltiple views of the left hand taken today reviewed interpreted by me.  Demonstrate essentially unchanged alignment of the small and ring finger proximal phalangeal base fractures.  There is mild apex volar angulation of the small finger on the lateral view.  The ring finger remains essentially nondisplaced.   Assessment/Plan 1. Mixed hyperlipidemia No recent labs for review -Continue on atorvastatin - Lipid panel  2. Primary hypertension Blood pressure stable  -Continue on lisinopril-hydrochlorothiazide, amlodipine, and carvedilol -Continue to monitor blood pressure -On aspirin and statin for cardiovascular event prevention - COMPLETE METABOLIC PANEL WITH GFR - CBC with Differential/Platelet  3. Centrilobular emphysema (HCC) Continue on oxygen 2 L via nasal cannula -Continue to Trelegy, Singulair and albuterol  4. Type  2 diabetes mellitus with hyperlipidemia (HCC) CBG well controlled with few hypoglycemic with skipping meals -Encouraged heart healthy bedtime snack -Continue on metformin - TSH - COMPLETE METABOLIC PANEL WITH GFR - CBC with Differential/Platelet - Hemoglobin A1c  5. Encounter to establish care Available records review recommend scheduling fasting blood work -Advised to obtain immunization records then I will update.  Family/ staff Communication: Reviewed plan of care with patient verbalized understanding  Labs/tests ordered:  - CBC with Differential/Platelet - CMP with eGFR([facility]) - TSH - Hgb A1C - Lipid panel  Next Appointment : Return in about 6 months (around 01/27/2022) for fasting labs next week.   Sandrea Hughs, NP

## 2021-08-15 ENCOUNTER — Ambulatory Visit (INDEPENDENT_AMBULATORY_CARE_PROVIDER_SITE_OTHER): Payer: Medicare Other | Admitting: Orthopedic Surgery

## 2021-08-15 ENCOUNTER — Ambulatory Visit (INDEPENDENT_AMBULATORY_CARE_PROVIDER_SITE_OTHER): Payer: Medicare Other

## 2021-08-15 DIAGNOSIS — S62617A Displaced fracture of proximal phalanx of left little finger, initial encounter for closed fracture: Secondary | ICD-10-CM

## 2021-08-15 DIAGNOSIS — S62645A Nondisplaced fracture of proximal phalanx of left ring finger, initial encounter for closed fracture: Secondary | ICD-10-CM

## 2021-08-15 NOTE — Progress Notes (Deleted)
   Office Visit Note   Patient: [name]           Date of Birth: 31-Mar-1955           MRN: [id number] Visit Date: 08/15/2021              Requested by: Mack Hook, MD [place],  [place] [id number] PCP: Sandrea Hughs, NP   Assessment & Plan: Visit Diagnoses:  1. Displaced fracture of proximal phalanx of left little finger, initial encounter for closed fracture   2. Closed nondisplaced fracture of proximal phalanx of left ring finger, initial encounter     Plan: ***  Follow-Up Instructions: No follow-ups on file.   Orders:  Orders Placed This Encounter  Procedures   XR Finger Little Right   No orders of the defined types were placed in this encounter.     Procedures: No procedures performed   Clinical Data: No additional findings.   Subjective: No chief complaint on file.   HPI  Review of Systems   Objective: Vital Signs: There were no vitals taken for this visit.  Physical Exam  Ortho Exam  Specialty Comments:  No specialty comments available.  Imaging: No results found.   PMFS History: Patient Active Problem List   Diagnosis Date Noted   Nondisplaced fracture of proximal phalanx of left ring finger 07/25/2021   Displaced fracture of proximal phalanx of left little finger, initial encounter for closed fracture 07/07/2021   Onychomycosis of toenail 03/22/2021   Dry skin dermatitis 03/22/2021   DM type 2 (diabetes mellitus, type 2) ([place]) 02/18/2021   COPD GOLD 0  11/08/2020   Depression 11/08/2020   Mixed hyperlipidemia 11/08/2020   Chronic respiratory failure with hypoxia ([place]) 10/17/2020   Mild sleep apnea 10/17/2020   Class 2 severe obesity due to excess calories with serious comorbidity and body mass index (BMI) of 35.0 to 35.9 in adult [facility]) 08/15/2014   Centrilobular emphysema ([place]) 06/26/2014   Primary hypertension 06/26/2014   Dyspnea 06/25/2014   Past Medical History:  Diagnosis Date   Anxiety     Asthma    Chest pain    COPD (chronic obstructive pulmonary disease) ([place])    Depression    DM type 2 (diabetes mellitus, type 2) ([place])    Hypertension    Pneumonia    Shortness of breath dyspnea     Family History  Problem Relation Age of Onset   Allergies Mother    Allergies Daughter    Asthma Son        had as a child    Past Surgical History:  Procedure Laterality Date   PARTIAL HYSTERECTOMY  2007   TEE WITHOUT CARDIOVERSION N/A 08/24/2014   Procedure: TRANSESOPHAGEAL ECHOCARDIOGRAM (TEE);  Surgeon: Adrian Prows, MD;  [facility] ENDOSCOPY;  Service: Cardiovascular;  Laterality: N/A;   Social History   Occupational History   Not on file  Tobacco Use   Smoking status: Former    Packs/day: 1.00    Years: 38.00    Total pack years: 38.00    Types: Cigarettes    Quit date: 01/16/2008    Years since quitting: 13.5   Smokeless tobacco: Never  Vaping Use   Vaping Use: Never used  Substance and Sexual Activity   Alcohol use: No    Alcohol/week: 0.0 standard drinks of alcohol   Drug use: No   Sexual activity: Not on file

## 2021-08-15 NOTE — Progress Notes (Signed)
Office Visit Note   Patient: [name]           Date of Birth: 1955-04-07           MRN: [id number] Visit Date: 08/15/2021              Requested by: Mack Hook, MD [place],  [place] [id number] PCP: Sandrea Hughs, NP   Assessment & Plan: Visit Diagnoses:  1. Displaced fracture of proximal phalanx of left little finger, initial encounter for closed fracture   2. Closed nondisplaced fracture of proximal phalanx of left ring finger, initial encounter     Plan: Patient is now 6 weeks out from her injury.  She notes that her brace broke and she has been unable to wear it.  She has not been to therapy in some time.  She is able to make a near complete fist without any malrotation.  She does have some mild pseudoclawing of the small finger with interval increased apex volar angulation of the proximal phalanx fracture.  Her pain continues to improve but she is still sore.  We will continue conservative management.  She has a home exercise program that she is working on.  She can see me back again in a few weeks.   Follow-Up Instructions: No follow-ups on file.   Orders:  Orders Placed This Encounter  Procedures   XR Finger Little Right   No orders of the defined types were placed in this encounter.     Procedures: No procedures performed   Clinical Data: No additional findings.   Subjective: Chief Complaint  Patient presents with   Left Little Finger - Follow-up, Fracture   Left Ring Finger - Follow-up, Fracture    This is a 66 year old right-hand-dominant female presents for follow-up of a left small and ring finger proximal phalanx fracture.  Patient had a ground-level fall about 6 weeks ago.  We discussed closed duction percutaneous pinning versus conservative management with the patient like to proceed with conservative treatment..  She was initially seen by hand therapy and had a Thermoplast splint made.  She notes that the splint has since  broken.  She has not seen hand therapy in some time.  She describes some continued pain at the bases of the spring and small fingers.  Her range of motion overall has improved.  She has no evidence of malrotation with use of the hand.     Review of Systems   Objective: Vital Signs: There were no vitals taken for this visit.  Physical Exam  Left Hand Exam   Tenderness  Left hand tenderness location: TTP at base of small and ring fingers around the proximal phalangeal bases.   Other  Erythema: absent Sensation: normal Pulse: present  Comments:  Swelling much improved.  Difficult to fully assess her ROM secondary to very long fingernails but seems to lack 1-2 cm from her [facility] with flexion.  No clinical malrotation.  Mild pseudoclawing of the small finger.       Specialty Comments:  No specialty comments available.  Imaging: No results found.   PMFS History: Patient Active Problem List   Diagnosis Date Noted   Nondisplaced fracture of proximal phalanx of left ring finger 07/25/2021   Displaced fracture of proximal phalanx of left little finger, initial encounter for closed fracture 07/07/2021   Onychomycosis of toenail 03/22/2021   Dry skin dermatitis 03/22/2021   DM type 2 (diabetes mellitus, type 2) ([place]) 02/18/2021  COPD GOLD 0  11/08/2020   Depression 11/08/2020   Mixed hyperlipidemia 11/08/2020   Chronic respiratory failure with hypoxia ([place]) 10/17/2020   Mild sleep apnea 10/17/2020   Class 2 severe obesity due to excess calories with serious comorbidity and body mass index (BMI) of 35.0 to 35.9 in adult [facility]) 08/15/2014   Centrilobular emphysema ([place]) 06/26/2014   Primary hypertension 06/26/2014   Dyspnea 06/25/2014   Past Medical History:  Diagnosis Date   Anxiety    Asthma    Chest pain    COPD (chronic obstructive pulmonary disease) (HCC)    Depression    DM type 2 (diabetes mellitus, type 2) (HCC)    Hypertension    Pneumonia    Shortness of breath  dyspnea     Family History  Problem Relation Age of Onset   Allergies Mother    Allergies Daughter    Asthma Son        had as a child    Past Surgical History:  Procedure Laterality Date   PARTIAL HYSTERECTOMY  2007   TEE WITHOUT CARDIOVERSION N/A 08/24/2014   Procedure: TRANSESOPHAGEAL ECHOCARDIOGRAM (TEE);  Surgeon: Adrian Prows, MD;  Location: [facility] ENDOSCOPY;  Service: Cardiovascular;  Laterality: N/A;   Social History   Occupational History   Not on file  Tobacco Use   Smoking status: Former    Packs/day: 1.00    Years: 38.00    Total pack years: 38.00    Types: Cigarettes    Quit date: 01/16/2008    Years since quitting: 13.5   Smokeless tobacco: Never  Vaping Use   Vaping Use: Never used  Substance and Sexual Activity   Alcohol use: No    Alcohol/week: 0.0 standard drinks of alcohol   Drug use: No   Sexual activity: Not on file

## 2021-09-05 ENCOUNTER — Ambulatory Visit (INDEPENDENT_AMBULATORY_CARE_PROVIDER_SITE_OTHER): Payer: Medicare Other | Admitting: Orthopedic Surgery

## 2021-09-05 ENCOUNTER — Ambulatory Visit (INDEPENDENT_AMBULATORY_CARE_PROVIDER_SITE_OTHER): Payer: Medicare Other

## 2021-09-05 DIAGNOSIS — S62645A Nondisplaced fracture of proximal phalanx of left ring finger, initial encounter for closed fracture: Secondary | ICD-10-CM

## 2021-09-05 DIAGNOSIS — S62617A Displaced fracture of proximal phalanx of left little finger, initial encounter for closed fracture: Secondary | ICD-10-CM

## 2021-09-05 NOTE — Progress Notes (Signed)
Office Visit Note   Patient: [name]           Date of Birth: January 04, 1956           MRN: [id number] Visit Date: 09/05/2021              Requested by: Sandrea Hughs, NP [address] [place],  [place] [address] PCP: Sandrea Hughs, NP   Assessment & Plan: Visit Diagnoses:  1. Displaced fracture of proximal phalanx of left little finger, initial encounter for closed fracture   2. Closed nondisplaced fracture of proximal phalanx of left ring finger, initial encounter     Plan: Patient is doing much better today.  She still has some mild discomfort at the bases of the left small and ring fingers but this is overall improving.  She has near full range of motion without evidence of malrotation.  She is going to continue to work on finger range of motion at home and can follow-up again with me as needed.  Follow-Up Instructions: No follow-ups on file.   Orders:  Orders Placed This Encounter  Procedures   XR Hand Complete Left   No orders of the defined types were placed in this encounter.     Procedures: No procedures performed   Clinical Data: No additional findings.   Subjective: Chief Complaint  Patient presents with   Left Little Finger - Follow-up, Fracture   Left Ring Finger - Follow-up, Fracture    This is a 66 year old right-hand-dominant female presents follow-up of a left small and ring finger proximal phalanx fracture.  She had ground-level fall around 8 weeks ago.  At that time, patient elected to proceed with conservative treatment with a period of short immobilization.  She has not been seen by hand therapy in quite some time.  She still has mild pain at the base of her ring and small fingers but this is overall much improved.  She is able to make a near complete fist.  She has not been working on any home exercise program.    Review of Systems   Objective: Vital Signs: There were no vitals taken for this visit.  Physical Exam  Left Hand Exam    Tenderness  Left hand tenderness location: Mildly TTP at bases of small and ring fingers.   Other  Erythema: absent Sensation: normal Pulse: present  Comments:  Near full fist without evidence of malrotation.       Specialty Comments:  No specialty comments available.  Imaging: No results found.   PMFS History: Patient Active Problem List   Diagnosis Date Noted   Nondisplaced fracture of proximal phalanx of left ring finger 07/25/2021   Displaced fracture of proximal phalanx of left little finger, initial encounter for closed fracture 07/07/2021   Onychomycosis of toenail 03/22/2021   Dry skin dermatitis 03/22/2021   DM type 2 (diabetes mellitus, type 2) ([place]) 02/18/2021   COPD GOLD 0  11/08/2020   Depression 11/08/2020   Mixed hyperlipidemia 11/08/2020   Chronic respiratory failure with hypoxia ([place]) 10/17/2020   Mild sleep apnea 10/17/2020   Class 2 severe obesity due to excess calories with serious comorbidity and body mass index (BMI) of 35.0 to 35.9 in adult [facility]) 08/15/2014   Centrilobular emphysema ([place]) 06/26/2014   Primary hypertension 06/26/2014   Dyspnea 06/25/2014   Past Medical History:  Diagnosis Date   Anxiety    Asthma    Chest pain    COPD (chronic obstructive  pulmonary disease) ([place])    Depression    DM type 2 (diabetes mellitus, type 2) (HCC)    Hypertension    Pneumonia    Shortness of breath dyspnea     Family History  Problem Relation Age of Onset   Allergies Mother    Allergies Daughter    Asthma Son        had as a child    Past Surgical History:  Procedure Laterality Date   PARTIAL HYSTERECTOMY  2007   TEE WITHOUT CARDIOVERSION N/A 08/24/2014   Procedure: TRANSESOPHAGEAL ECHOCARDIOGRAM (TEE);  Surgeon: Adrian Prows, MD;  Location: [facility] ENDOSCOPY;  Service: Cardiovascular;  Laterality: N/A;   Social History   Occupational History   Not on file  Tobacco Use   Smoking status: Former    Packs/day: 1.00    Years: 38.00    Total  pack years: 38.00    Types: Cigarettes    Quit date: 01/16/2008    Years since quitting: 13.6   Smokeless tobacco: Never  Vaping Use   Vaping Use: Never used  Substance and Sexual Activity   Alcohol use: No    Alcohol/week: 0.0 standard drinks of alcohol   Drug use: No   Sexual activity: Not on file

## 2021-09-07 ENCOUNTER — Other Ambulatory Visit (INDEPENDENT_AMBULATORY_CARE_PROVIDER_SITE_OTHER): Payer: Self-pay | Admitting: Primary Care

## 2021-09-07 DIAGNOSIS — I10 Essential (primary) hypertension: Secondary | ICD-10-CM

## 2021-09-28 ENCOUNTER — Other Ambulatory Visit: Payer: Self-pay | Admitting: Internal Medicine

## 2021-09-28 DIAGNOSIS — R7303 Prediabetes: Secondary | ICD-10-CM

## 2021-10-02 NOTE — Telephone Encounter (Signed)
Can you check to see how she was taking Metformin before her A1C in June?  I had asked her in March to decrease to just 1 tab with breakfast as she had poor eating habits and was getting low blood sugars in the mornings. Before I change her refill request please

## 2021-10-03 NOTE — Telephone Encounter (Incomplete)
Going to another provider.

## 2021-11-23 ENCOUNTER — Other Ambulatory Visit (INDEPENDENT_AMBULATORY_CARE_PROVIDER_SITE_OTHER): Payer: Self-pay | Admitting: Primary Care

## 2021-11-23 DIAGNOSIS — I10 Essential (primary) hypertension: Secondary | ICD-10-CM

## 2021-11-23 NOTE — Telephone Encounter (Signed)
PCP listed is not at this practice. Will refuse request.  Requested Prescriptions  Pending Prescriptions Disp Refills   carvedilol (COREG) 12.5 MG tablet [Pharmacy Med Name: Carvedilol 12.5 MG Oral Tablet] 160 tablet 3    Sig: TAKE 1 TABLET BY MOUTH TWICE  DAILY WITH MEALS     Cardiovascular: Beta Blockers 3 Failed - 11/23/2021  6:36 AM      Failed - ALT in normal range and within 360 days    ALT  Date Value Ref Range Status  08/03/2021 30 (H) 6 - 29 U/L Final         Failed - Last BP in normal range    BP Readings from Last 1 Encounters:  07/27/21 (!) 150/80         Failed - Valid encounter within last 6 months    Recent Outpatient Visits           1 year ago Essential hypertension   [place], Michelle P, NP       Future Appointments             In 2 months Ngetich, Nelda Bucks, NP [facility]            Passed - Cr in normal range and within 360 days    Creat  Date Value Ref Range Status  08/03/2021 0.58 0.50 - 1.05 mg/dL Final         Passed - AST in normal range and within 360 days    AST  Date Value Ref Range Status  08/03/2021 22 10 - 35 U/L Final         Passed - Last Heart Rate in normal range    Pulse Readings from Last 1 Encounters:  07/27/21 91

## 2021-11-27 ENCOUNTER — Other Ambulatory Visit: Payer: Self-pay | Admitting: *Deleted

## 2021-11-27 DIAGNOSIS — I10 Essential (primary) hypertension: Secondary | ICD-10-CM

## 2021-11-27 MED ORDER — MONTELUKAST SODIUM 10 MG PO TABS: 10.0000 mg | ORAL_TABLET | Freq: Every day | ORAL | 1 refills | Status: DC

## 2021-11-27 MED ORDER — AMLODIPINE BESYLATE 5 MG PO TABS: 5.0000 mg | ORAL_TABLET | Freq: Every day | ORAL | 1 refills | Status: DC

## 2021-11-27 MED ORDER — CARVEDILOL 12.5 MG PO TABS: 12.5000 mg | ORAL_TABLET | Freq: Two times a day (BID) | ORAL | 1 refills | Status: DC

## 2021-11-27 MED ORDER — ATORVASTATIN CALCIUM 80 MG PO TABS: 80.0000 mg | ORAL_TABLET | Freq: Every day | ORAL | 1 refills | Status: DC

## 2021-11-27 MED ORDER — LISINOPRIL-HYDROCHLOROTHIAZIDE 20-12.5 MG PO TABS: 1.0000 | ORAL_TABLET | Freq: Every day | ORAL | 1 refills | Status: DC

## 2021-11-27 NOTE — Telephone Encounter (Signed)
[facility] requested refills.  

## 2021-11-27 NOTE — Addendum Note (Signed)
Addended by: Rafael Bihari A on: 11/27/2021 02:21 PM   Modules accepted: Orders

## 2021-12-13 ENCOUNTER — Other Ambulatory Visit: Payer: Self-pay | Admitting: Internal Medicine

## 2021-12-13 DIAGNOSIS — R7303 Prediabetes: Secondary | ICD-10-CM

## 2021-12-25 ENCOUNTER — Telehealth: Payer: Medicare Other

## 2021-12-25 MED ORDER — ESOMEPRAZOLE MAGNESIUM 40 MG PO CPDR: 40.0000 mg | DELAYED_RELEASE_CAPSULE | Freq: Every day | ORAL | 3 refills | Status: DC

## 2021-12-25 NOTE — Telephone Encounter (Signed)
Patient called and requested a rx for Nexium and medication was send into pharmacy.

## 2022-01-29 ENCOUNTER — Encounter: Payer: Self-pay | Admitting: Family

## 2022-01-29 ENCOUNTER — Ambulatory Visit (INDEPENDENT_AMBULATORY_CARE_PROVIDER_SITE_OTHER): Payer: Medicare Other | Admitting: Family

## 2022-01-29 VITALS — BP 130/88 | HR 87 | Temp 97.2°F | Resp 17 | Ht 67.0 in | Wt 211.4 lb

## 2022-01-29 DIAGNOSIS — I10 Essential (primary) hypertension: Secondary | ICD-10-CM | POA: Diagnosis not present

## 2022-01-29 DIAGNOSIS — E782 Mixed hyperlipidemia: Secondary | ICD-10-CM | POA: Diagnosis not present

## 2022-01-29 DIAGNOSIS — Z780 Asymptomatic menopausal state: Secondary | ICD-10-CM

## 2022-01-29 DIAGNOSIS — E1169 Type 2 diabetes mellitus with other specified complication: Secondary | ICD-10-CM

## 2022-01-29 DIAGNOSIS — E785 Hyperlipidemia, unspecified: Secondary | ICD-10-CM

## 2022-01-29 DIAGNOSIS — J432 Centrilobular emphysema: Secondary | ICD-10-CM | POA: Diagnosis not present

## 2022-01-29 DIAGNOSIS — Z23 Encounter for immunization: Secondary | ICD-10-CM

## 2022-01-29 DIAGNOSIS — Z1231 Encounter for screening mammogram for malignant neoplasm of breast: Secondary | ICD-10-CM

## 2022-01-29 MED ORDER — NITROGLYCERIN 0.3 MG SL SUBL: 0.3000 mg | SUBLINGUAL_TABLET | SUBLINGUAL | 0 refills | Status: DC | PRN

## 2022-01-29 NOTE — Progress Notes (Signed)
Provider: Marlowe Sax FNP-C   Donasia Wimes, Nelda Bucks, NP  Patient Care Team: Theron Cumbie, Nelda Bucks, NP as PCP - General (Family Medicine)  Extended Emergency Contact Information Primary Emergency Contact: [facility] Address: [place] [address] [address] Phone: [phone] Relation: Mother Secondary Emergency Contact: [place] Mobile Phone: [phone] Relation: Daughter Preferred language: English Interpreter needed? No  Code Status:  Full Code Goals of care: Advanced Directive information    01/29/2022   12:58 PM  Advanced Directives  Does Patient Have a Medical Advance Directive? No  Would patient like information on creating a medical advance directive? No - Patient declined     Chief Complaint  Patient presents with   Medical Management of Chronic Issues    6 month follow up.    Health Maintenance    Discuss the need for Foot exam, Eye exam, Colonoscopy, Mammogram, Dexa scan. Lung Cancer Screening, and AWV.   Immunizations    Discuss the need for Pne vaccine, Influenza vaccine, and Covid Booster.     HPI:  Pt is a 67 y.o. female seen today for 6 months for medical management of chronic diseases.   States CBG runs in 100's -110's.denies any hypoglycemia symptoms.  Follows up with Dr.Groat  Does not follow up with podiatrist states goes to the nail shop.denies any numbness or tingling on the feet.  Emphysema  - wears 2 liter of oxygen via nasal cannula throughout. She denies any fever,chills,fatigue,body aches,chest tightness,chest pain,palpitation or shortness of breath. States has been on Prozac 40 mg tablet for many years would like to switch to another medication.   States did a Cologuard  about 2 yrs ago.   Due for Influenza,PNA and COVID-19 booster vaccine.  Past Medical History:  Diagnosis Date   Anxiety    Asthma    Chest pain    COPD (chronic obstructive pulmonary disease) ([place])    Depression    DM  type 2 (diabetes mellitus, type 2) ([place])    Hypertension    Pneumonia    Shortness of breath dyspnea    Past Surgical History:  Procedure Laterality Date   PARTIAL HYSTERECTOMY  2007   TEE WITHOUT CARDIOVERSION N/A 08/24/2014   Procedure: TRANSESOPHAGEAL ECHOCARDIOGRAM (TEE);  Surgeon: Adrian Prows, MD;  Location: [facility] ENDOSCOPY;  Service: Cardiovascular;  Laterality: N/A;    Allergies  Allergen Reactions   Flonase [Fluticasone Propionate]     Nose bleeds    Allergies as of 01/29/2022       Reactions   Flonase [fluticasone Propionate]    Nose bleeds        Medication List        Accurate as of January 29, 2022  1:09 PM. If you have any questions, ask your nurse or doctor.          STOP taking these medications    acetaminophen 325 MG tablet Commonly known as: TYLENOL Stopped by: Sandrea Hughs, NP       TAKE these medications    albuterol 108 (90 Base) MCG/ACT inhaler Commonly known as: ProAir HFA Inhale 1-2 puffs into the lungs every 6 (six) hours as needed for wheezing or shortness of breath.   ALEVE PO Take 1 tablet by mouth at bedtime as needed.   amLODipine 5 MG tablet Commonly known as: NORVASC Take 1 tablet (5 mg total) by mouth daily.   aspirin 81 MG tablet Take 81 mg by  mouth daily.   atorvastatin 80 MG tablet Commonly known as: LIPITOR Take 1 tablet (80 mg total) by mouth daily.   carvedilol 12.5 MG tablet Commonly known as: COREG Take 1 tablet (12.5 mg total) by mouth 2 (two) times daily with a meal.   esomeprazole 40 MG capsule Commonly known as: NEXIUM Take 1 capsule (40 mg total) by mouth daily.   FLUoxetine 40 MG capsule Commonly known as: PROZAC Take 1 capsule (40 mg total) by mouth daily.   FreeStyle Libre 14 Day Sensor Misc Check blood glucose 3 times daily before meals and as needed   linaclotide 145 MCG Caps capsule Commonly known as: Linzess Take 1 capsule (145 mcg total) by mouth daily before breakfast.    lisinopril-hydrochlorothiazide 20-12.5 MG tablet Commonly known as: ZESTORETIC Take 1 tablet by mouth daily.   metFORMIN 500 MG 24 hr tablet Commonly known as: GLUCOPHAGE-XR TAKE 1 TABLET BY MOUTH TWICE  DAILY WITH MEALS   montelukast 10 MG tablet Commonly known as: SINGULAIR Take 1 tablet (10 mg total) by mouth at bedtime.   nitroGLYCERIN 0.3 MG SL tablet Commonly known as: NITROSTAT Place 0.3 mg under the tongue every 5 (five) minutes as needed for chest pain (may repeat x3).   tiZANidine 2 MG tablet Commonly known as: ZANAFLEX Take 2 mg by mouth every 6 (six) hours as needed.   traZODone 50 MG tablet Commonly known as: DESYREL Take 1 tablet (50 mg total) by mouth at bedtime as needed.   Trelegy Ellipta 100-62.5-25 MCG/ACT Aepb Generic drug: Fluticasone-Umeclidin-Vilant USE 1 INHALATION BY MOUTH  ONCE DAILY AT THE SAME TIME EACH DAY   VICKS VAPORUB EX Apply 1 application topically 2 (two) times daily as needed (breathe and soreness).        Review of Systems  Constitutional:  Negative for appetite change, chills, fatigue, fever and unexpected weight change.  HENT:  Negative for congestion, dental problem, ear discharge, ear pain, facial swelling, hearing loss, nosebleeds, postnasal drip, rhinorrhea, sinus pressure, sinus pain, sneezing, sore throat, tinnitus and trouble swallowing.   Eyes:  Negative for pain, discharge, redness, itching and visual disturbance.  Respiratory:  Negative for cough, chest tightness, shortness of breath and wheezing.   Cardiovascular:  Negative for chest pain, palpitations and leg swelling.  Gastrointestinal:  Negative for abdominal distention, abdominal pain, blood in stool, constipation, diarrhea, nausea and vomiting.  Endocrine: Negative for cold intolerance, heat intolerance, polydipsia, polyphagia and polyuria.  Genitourinary:  Negative for difficulty urinating, dysuria, flank pain, frequency and urgency.  Musculoskeletal:  Positive for  arthralgias and gait problem. Negative for back pain, joint swelling, myalgias, neck pain and neck stiffness.       Uses the cane and a walker  Skin:  Negative for color change, pallor, rash and wound.  Neurological:  Negative for dizziness, syncope, speech difficulty, weakness, light-headedness, numbness and headaches.       Right leg weakness and left hand  Hematological:  Does not bruise/bleed easily.  Psychiatric/Behavioral:  Negative for agitation, behavioral problems, confusion, hallucinations, self-injury, sleep disturbance and suicidal ideas. The patient is not nervous/anxious.     Immunization History  Administered Date(s) Administered   Fluad Quad(high Dose 65+) 10/17/2020   Influenza,inj,Quad PF,6+ Mos 10/30/2019   Influenza-Unspecified 10/30/2019   PFIZER(Purple Top)SARS-COV-2 Vaccination 04/09/2019, 05/08/2019   Pfizer Covid-19 Vaccine Bivalent Booster 77yr & up 11/08/2020   Pneumococcal Conjugate-13 02/16/2020   Tdap 03/22/2021   Zoster Recombinat (Shingrix) 11/08/2020, 03/22/2021   Pertinent  Health Maintenance Due  Topic Date Due   FOOT EXAM  Never done   OPHTHALMOLOGY EXAM  Never done   COLONOSCOPY (Pts 45-11yr Insurance coverage will need to be confirmed)  Never done   MAMMOGRAM  06/04/2015   DEXA SCAN  Never done   INFLUENZA VACCINE  08/15/2021   HEMOGLOBIN A1C  02/03/2022      09/27/2020    2:01 PM 07/04/2021    3:02 PM 07/27/2021    1:59 PM 01/29/2022   12:58 PM  [place]in the past year? '1  1 1  '$ Was there an injury with Fall? 0  1 1  Fall Risk Category Calculator '1  2 2  '$ Fall Risk Category (Retired) Low  Moderate   (RETIRED) Patient Fall Risk Level Low fall risk Low fall risk Moderate fall risk   Patient at Risk for Falls Due to   History of fall(s);Impaired balance/gait;Impaired mobility History of fall(s);Impaired balance/gait;Impaired mobility  Fall risk Follow up   Falls evaluation completed;Education provided;Falls prevention discussed  Falls evaluation completed;Education provided;Falls prevention discussed   Functional Status Survey:    Vitals:   01/29/22 1255  BP: 130/88  Pulse: 87  Resp: 17  Temp: (!) 97.2 F (36.2 C)  SpO2: 92%  Weight: 211 lb 6.4 oz (95.9 kg)  Height: '5\' 7"'$  (1.702 m)   Body mass index is 33.11 kg/m. Physical Exam Vitals reviewed.  Constitutional:      General: She is not in acute distress.    Appearance: Normal appearance. She is obese. She is not ill-appearing or diaphoretic.  HENT:     Head: Normocephalic.     Right Ear: Tympanic membrane, ear canal and external ear normal. There is no impacted cerumen.     Left Ear: Tympanic membrane, ear canal and external ear normal. There is no impacted cerumen.     Nose: Nose normal. No congestion or rhinorrhea.     Mouth/Throat:     Mouth: Mucous membranes are moist.     Pharynx: Oropharynx is clear. No oropharyngeal exudate or posterior oropharyngeal erythema.  Eyes:     General: No scleral icterus.       Right eye: No discharge.        Left eye: No discharge.     Extraocular Movements: Extraocular movements intact.     Conjunctiva/sclera: Conjunctivae normal.     Pupils: Pupils are equal, round, and reactive to light.  Neck:     Vascular: No carotid bruit.  Cardiovascular:     Rate and Rhythm: Normal rate and regular rhythm.     Pulses: Normal pulses.     Heart sounds: Normal heart sounds. No murmur heard.    No friction rub. No gallop.  Pulmonary:     Effort: Pulmonary effort is normal. No respiratory distress.     Breath sounds: Normal breath sounds. No wheezing, rhonchi or rales.  Chest:     Chest wall: No tenderness.  Abdominal:     General: Bowel sounds are normal. There is no distension.     Palpations: Abdomen is soft. There is no mass.     Tenderness: There is no abdominal tenderness. There is no right CVA tenderness, left CVA tenderness, guarding or rebound.  Musculoskeletal:        General: No swelling or tenderness.  Normal range of motion.     Cervical back: Normal range of motion. No rigidity or tenderness.     Right lower leg: No edema.     Left  lower leg: No edema.  Lymphadenopathy:     Cervical: No cervical adenopathy.  Skin:    General: Skin is warm and dry.     Coloration: Skin is not pale.     Findings: No bruising, erythema, lesion or rash.  Neurological:     Mental Status: She is alert and oriented to person, place, and time.     Cranial Nerves: No cranial nerve deficit.     Sensory: No sensory deficit.     Motor: Weakness present.     Coordination: Coordination normal.     Gait: Gait abnormal.     Comments: Right leg and left hand weakness   Psychiatric:        Mood and Affect: Mood normal.        Speech: Speech normal.        Behavior: Behavior normal.        Thought Content: Thought content normal.        Judgment: Judgment normal.     Labs reviewed: Recent Labs    03/29/21 0000 08/03/21 0917  NA 139 141  K 4.0 3.8  CL 105 107  CO2 22 23  GLUCOSE 148* 97  BUN 12 10  CREATININE 0.75 0.58  CALCIUM 10.6* 10.7*   Recent Labs    03/29/21 0000 08/03/21 0917  AST 22 22  ALT 27 30*  ALKPHOS 66  --   BILITOT 0.4 0.4  PROT 6.5 6.7  ALBUMIN 3.8  --    Recent Labs    08/03/21 0917  WBC 5.2  NEUTROABS 2,985  HGB 14.3  HCT 43.6  MCV 91.2  PLT 219   Lab Results  Component Value Date   TSH 1.53 08/03/2021   Lab Results  Component Value Date   HGBA1C 6.4 (H) 08/03/2021   Lab Results  Component Value Date   CHOL 125 08/03/2021   HDL 50 08/03/2021   LDLCALC 62 08/03/2021   TRIG 57 08/03/2021   CHOLHDL 2.5 08/03/2021    Significant Diagnostic Results in last 30 days:  No results found.  Assessment/Plan  1. Primary hypertension -Continue lisinopril and hydrochlorothiazide, carvedilol and amlodipine Continue to monitor blood pressure - TSH - COMPLETE METABOLIC PANEL WITH GFR - CBC with Differential/Platelet  2. Mixed hyperlipidemia LDL at  goal Continue on atorvastatin - Lipid panel  3. Type 2 diabetes mellitus with hyperlipidemia (HCC) Lab Results  Component Value Date   HGBA1C 6.4 (H) 08/03/2021  Continue on metformin - Hemoglobin A1c  4. Centrilobular emphysema (HCC) Breathing stable -Continue on Trelegy Ellipta  5. Breast cancer screening by mammogram Asymptomatic - MM DIGITAL SCREENING BILATERAL  6. Postmenopausal estrogen deficiency No recent fall or fractures - DG Bone Density  7. Need for pneumococcal 20-valent conjugate vaccination Afebrile  Prevnar 20 vaccine administered today by CMA no reaction reported.   - Pneumococcal conjugate vaccine 20-valent (Prevnar 20)  8. Need for influenza vaccination Afebrile  Flut shot administered by CMA no acute reaction reported.   - Flu Vaccine QUAD High Dose(Fluad)  Family/ staff Communication: Reviewed plan of care with patient verbalized understanding  Labs/tests ordered: - DG Bone Density - MM DIGITAL SCREENING BILATERAL - TSH - COMPLETE METABOLIC PANEL WITH GFR - CBC with Differential/Platelet - Hemoglobin A1c  Next Appointment : Return in about 6 months (around 07/30/2022) for medical mangement of chronic issues.Sandrea Hughs, NP

## 2022-01-29 NOTE — Patient Instructions (Signed)
Please contact your local pharmacy, previous provider, or insurance carrier for vaccine/immunization records. Ensure that any procedures done outside of [facility] are faxed to us [phone] or you can sign release of records form at the front desk to keep your medical record updated.   ?

## 2022-01-30 LAB — CBC WITH DIFFERENTIAL/PLATELET
Absolute Monocytes: 439 cells/uL (ref 200–950)
Basophils Absolute: 29 cells/uL (ref 0–200)
Basophils Relative: 0.5 %
Eosinophils Absolute: 57 cells/uL (ref 15–500)
Eosinophils Relative: 1 %
HCT: 45.2 % — ABNORMAL HIGH (ref 35.0–45.0)
Hemoglobin: 14.9 g/dL (ref 11.7–15.5)
Lymphs Abs: 1636 cells/uL (ref 850–3900)
MCH: 29.9 pg (ref 27.0–33.0)
MCHC: 33 g/dL (ref 32.0–36.0)
MCV: 90.8 fL (ref 80.0–100.0)
MPV: 12 fL (ref 7.5–12.5)
Monocytes Relative: 7.7 %
Neutro Abs: 3540 cells/uL (ref 1500–7800)
Neutrophils Relative %: 62.1 %
Platelets: 237 10*3/uL (ref 140–400)
RBC: 4.98 10*6/uL (ref 3.80–5.10)
RDW: 12.8 % (ref 11.0–15.0)
Total Lymphocyte: 28.7 %
WBC: 5.7 10*3/uL (ref 3.8–10.8)

## 2022-01-30 LAB — LIPID PANEL
Cholesterol: 172 mg/dL (ref <200)
HDL: 56 mg/dL (ref >=50)
LDL Cholesterol (Calc): 94 mg/dL (calc)
Non-HDL Cholesterol (Calc): 116 mg/dL (calc) (ref <130)
Total CHOL/HDL Ratio: 3.1 (calc) (ref <5.0)
Triglycerides: 128 mg/dL (ref <150)

## 2022-01-30 LAB — COMPLETE METABOLIC PANEL WITH GFR
AG Ratio: 1.4 (calc) (ref 1.0–2.5)
ALT: 20 U/L (ref 6–29)
AST: 16 U/L (ref 10–35)
Albumin: 4.1 g/dL (ref 3.6–5.1)
Alkaline phosphatase (APISO): 56 U/L (ref 37–153)
BUN: 10 mg/dL (ref 7–25)
CO2: 23 mmol/L (ref 20–32)
Calcium: 11.2 mg/dL — ABNORMAL HIGH (ref 8.6–10.4)
Chloride: 110 mmol/L (ref 98–110)
Creat: 0.65 mg/dL (ref 0.50–1.05)
Globulin: 2.9 g/dL (calc) (ref 1.9–3.7)
Glucose, Bld: 92 mg/dL (ref 65–139)
Potassium: 3.9 mmol/L (ref 3.5–5.3)
Sodium: 145 mmol/L (ref 135–146)
Total Bilirubin: 0.5 mg/dL (ref 0.2–1.2)
Total Protein: 7 g/dL (ref 6.1–8.1)
eGFR: 97 mL/min/{1.73_m2} (ref >=60)

## 2022-01-30 LAB — HEMOGLOBIN A1C
Hgb A1c MFr Bld: 6.8 % of total Hgb — ABNORMAL HIGH (ref <5.7)
Mean Plasma Glucose: 148 mg/dL
eAG (mmol/L): 8.2 mmol/L

## 2022-01-30 LAB — TSH: TSH: 0.82 mIU/L (ref 0.40–4.50)

## 2022-03-02 ENCOUNTER — Other Ambulatory Visit: Payer: Self-pay | Admitting: Internal Medicine

## 2022-03-02 DIAGNOSIS — R7303 Prediabetes: Secondary | ICD-10-CM

## 2022-03-16 ENCOUNTER — Other Ambulatory Visit (INDEPENDENT_AMBULATORY_CARE_PROVIDER_SITE_OTHER): Payer: Self-pay | Admitting: Primary Care

## 2022-03-16 DIAGNOSIS — J449 Chronic obstructive pulmonary disease, unspecified: Secondary | ICD-10-CM

## 2022-03-19 NOTE — Telephone Encounter (Signed)
Pt sees a different provider- Advice worker NP Requested Prescriptions  Refused Prescriptions Disp [place] 100-62.5-25 MCG/ACT AEPB [Pharmacy Med Name: Trelegy Ellipta 100-62.5-25 MCG/INH Inhalation Aerosol Powder Breath Activated] 180 each 3    Sig: USE 1 INHALATION BY MOUTH ONCE  DAILY AT THE SAME TIME EACH DAY     Off-Protocol Failed - 03/16/2022 10:01 PM      Failed - Medication not assigned to a protocol, review manually.      Failed - Valid encounter within last 12 months    Recent Outpatient Visits           1 year ago Essential hypertension   [place], [place] P, NP       Future Appointments             In 3 months Wynetta Emery, Dalbert Batman, MD [place]   In 4 months Ngetich, Nelda Bucks, NP [facility]

## 2022-03-21 ENCOUNTER — Other Ambulatory Visit: Payer: Self-pay | Admitting: Internal Medicine

## 2022-03-21 DIAGNOSIS — R7303 Prediabetes: Secondary | ICD-10-CM

## 2022-03-25 DIAGNOSIS — J9601 Acute respiratory failure with hypoxia: Secondary | ICD-10-CM | POA: Diagnosis not present

## 2022-03-31 ENCOUNTER — Other Ambulatory Visit: Payer: Self-pay | Admitting: Internal Medicine

## 2022-03-31 DIAGNOSIS — F32A Depression, unspecified: Secondary | ICD-10-CM

## 2022-04-01 ENCOUNTER — Other Ambulatory Visit: Payer: Self-pay | Admitting: Family

## 2022-04-03 ENCOUNTER — Other Ambulatory Visit: Payer: Self-pay

## 2022-04-03 DIAGNOSIS — F32A Depression, unspecified: Secondary | ICD-10-CM

## 2022-04-03 MED ORDER — FLUOXETINE HCL 40 MG PO CAPS: 40.0000 mg | ORAL_CAPSULE | Freq: Every day | ORAL | 1 refills | Status: DC

## 2022-04-03 NOTE — Telephone Encounter (Signed)
Refill request received from [facility]. High warnings came up when trying to refill medication.  Medication pended and sent to Marlowe Sax, NP

## 2022-04-05 DIAGNOSIS — J449 Chronic obstructive pulmonary disease, unspecified: Secondary | ICD-10-CM | POA: Diagnosis not present

## 2022-04-16 ENCOUNTER — Other Ambulatory Visit: Payer: Self-pay | Admitting: Family

## 2022-04-16 DIAGNOSIS — I10 Essential (primary) hypertension: Secondary | ICD-10-CM

## 2022-04-24 DIAGNOSIS — E119 Type 2 diabetes mellitus without complications: Secondary | ICD-10-CM | POA: Diagnosis not present

## 2022-04-25 DIAGNOSIS — J9601 Acute respiratory failure with hypoxia: Secondary | ICD-10-CM | POA: Diagnosis not present

## 2022-04-26 ENCOUNTER — Encounter: Payer: 59 | Admitting: Orthopedic Surgery

## 2022-05-03 ENCOUNTER — Ambulatory Visit (INDEPENDENT_AMBULATORY_CARE_PROVIDER_SITE_OTHER): Payer: 59 | Admitting: Family

## 2022-05-03 ENCOUNTER — Encounter: Payer: Self-pay | Admitting: Family

## 2022-05-03 VITALS — BP 118/68 | HR 72 | Temp 97.7°F | Resp 16 | Ht 67.32 in | Wt 208.2 lb

## 2022-05-03 DIAGNOSIS — E1169 Type 2 diabetes mellitus with other specified complication: Secondary | ICD-10-CM | POA: Diagnosis not present

## 2022-05-03 DIAGNOSIS — Z87891 Personal history of nicotine dependence: Secondary | ICD-10-CM

## 2022-05-03 DIAGNOSIS — Z0000 Encounter for general adult medical examination without abnormal findings: Secondary | ICD-10-CM | POA: Diagnosis not present

## 2022-05-03 DIAGNOSIS — E785 Hyperlipidemia, unspecified: Secondary | ICD-10-CM

## 2022-05-03 NOTE — Progress Notes (Signed)
Subjective:   [name] is a 67 y.o. female who presents for Medicare Annual (Subsequent) preventive examination.  Review of Systems     Cardiac Risk Factors include: advanced age (>16men, >67 women);diabetes mellitus;obesity (BMI >30kg/m2);smoking/ tobacco exposure;sedentary lifestyle     Objective:    Today's Vitals   05/03/22 1536 05/03/22 1609  BP: 118/68   Pulse: 72   Resp: 16   Temp: 97.7 F (36.5 C)   TempSrc: Temporal   Weight: 208 lb 3.2 oz (94.4 kg)   Height: 5' 7.32" (1.71 m)   PainSc:  9    Body mass index is 32.3 kg/m.     05/03/2022    3:31 PM 01/29/2022   12:58 PM 07/27/2021    2:00 PM 07/17/2021    9:57 AM 11/20/2014    4:44 PM 08/24/2014    9:00 AM  Advanced Directives  Does Patient Have a Medical Advance Directive? No No No No No No  Would patient like information on creating a medical advance directive? No - Patient declined No - Patient declined No - Patient declined Yes (MAU/Ambulatory/Procedural Areas - Information given) No - patient declined information No - patient declined information    Current Medications (verified) Outpatient Encounter Medications as of 05/03/2022  Medication Sig   albuterol (PROAIR HFA) 108 (90 Base) MCG/ACT inhaler Inhale 1-2 puffs into the lungs every 6 (six) hours as needed for wheezing or shortness of breath.   amLODipine (NORVASC) 5 MG tablet Take 1 tablet (5 mg total) by mouth daily.   aspirin 81 MG tablet Take 81 mg by mouth daily.   atorvastatin (LIPITOR) 80 MG tablet Take 1 tablet (80 mg total) by mouth daily.   Camphor-Eucalyptus-Menthol (VICKS VAPORUB EX) Apply 1 application topically 2 (two) times daily as needed (breathe and soreness).   carvedilol (COREG) 12.5 MG tablet TAKE 1 TABLET BY MOUTH TWICE  DAILY WITH MEALS   Continuous Blood Gluc Sensor (FREESTYLE LIBRE 14 DAY SENSOR) MISC Check blood glucose 3 times daily before meals and as needed   esomeprazole (NEXIUM) 40 MG capsule Take 1 capsule (40 mg total)  by mouth daily.   FLUoxetine (PROZAC) 40 MG capsule Take 1 capsule (40 mg total) by mouth daily.   linaclotide (LINZESS) 145 MCG CAPS capsule Take 1 capsule (145 mcg total) by mouth daily before breakfast.   lisinopril-hydrochlorothiazide (ZESTORETIC) 20-12.5 MG tablet Take 1 tablet by mouth daily.   metFORMIN (GLUCOPHAGE-XR) 500 MG 24 hr tablet TAKE 1 TABLET BY MOUTH TWICE  DAILY WITH MEALS   montelukast (SINGULAIR) 10 MG tablet Take 1 tablet (10 mg total) by mouth at bedtime.   Naproxen Sodium (ALEVE PO) Take 1 tablet by mouth at bedtime as needed.   nitroGLYCERIN (NITROSTAT) 0.3 MG SL tablet DISSOLVE 1 TABLET UNDER THE  TONGUE EVERY 5 MINUTES AS NEEDED FOR CHEST PAIN. MAX OF 3 TABLETS IN 15 MINUTES. CALL 911 IF PAIN  PERSISTS.   tiZANidine (ZANAFLEX) 2 MG tablet Take 2 mg by mouth every 6 (six) hours as needed.   traZODone (DESYREL) 50 MG tablet Take 1 tablet (50 mg total) by mouth at bedtime as needed.   TRELEGY ELLIPTA 100-62.5-25 MCG/ACT AEPB USE 1 INHALATION BY MOUTH  ONCE DAILY AT THE SAME TIME EACH DAY   No facility-administered encounter medications on file as of 05/03/2022.    Allergies (verified) Flonase [fluticasone propionate]   History: Past Medical History:  Diagnosis Date   Anxiety    Asthma  Chest pain    COPD (chronic obstructive pulmonary disease)    Depression    DM type 2 (diabetes mellitus, type 2)    Hypertension    Pneumonia    Shortness of breath dyspnea    Past Surgical History:  Procedure Laterality Date   PARTIAL HYSTERECTOMY  2007   TEE WITHOUT CARDIOVERSION N/A 08/24/2014   Procedure: TRANSESOPHAGEAL ECHOCARDIOGRAM (TEE);  Surgeon: Yates Decamp, MD;  Location: [facility];  Service: Cardiovascular;  Laterality: N/A;   Family History  Problem Relation Age of Onset   Allergies Mother    Allergies Daughter    Asthma Son        had as a child   Social History   Socioeconomic History   Marital status: Single    Spouse name: Not on file   Number  of children: Not on file   Years of education: Not on file   Highest education level: Not on file  Occupational History   Not on file  Tobacco Use   Smoking status: Former    Packs/day: 1.00    Years: 38.00    Additional pack years: 0.00    Total pack years: 38.00    Types: Cigarettes    Quit date: 01/16/2008    Years since quitting: 14.3   Smokeless tobacco: Never  Vaping Use   Vaping Use: Never used  Substance and Sexual Activity   Alcohol use: No    Alcohol/week: 0.0 standard drinks of alcohol   Drug use: No   Sexual activity: Not on file  Other Topics Concern   Not on file  Social History Narrative   Not on file   Social Determinants of Health   Financial Resource Strain: Not on file  Food Insecurity: Not on file  Transportation Needs: Not on file  Physical Activity: Not on file  Stress: Not on file  Social Connections: Not on file    Tobacco Counseling Counseling given: Not Answered   Clinical Intake:  Pre-visit preparation completed: No  Pain : 0-10 Pain Score: 9  Pain Type: Chronic pain Pain Location: Other (Comment) (groin) Pain Orientation: Right Pain Radiating Towards: No Pain Descriptors / Indicators: Dull Pain Frequency: Intermittent Pain Relieving Factors: OTC analgesic Effect of Pain on Daily Activities: sitting  Pain Relieving Factors: OTC analgesic  BMI - recorded: 32.2 Nutritional Status: BMI > 30  Obese Nutritional Risks: None Diabetes: Yes CBG done?: Yes (170 at home) CBG resulted in Enter/ Edit results?: No Did pt. bring in CBG monitor from home?: No (left monitor at home)  How often do you need to have someone help you when you read instructions, pamphlets, or other written materials from your doctor or pharmacy?: 1 - Never What is the last grade level you completed in school?: GED  Diabetic?Yes   Interpreter Needed?: No      Activities of Daily Living    05/03/2022    4:20 PM 05/03/2022    3:33 PM  In your present  state of health, do you have any difficulty performing the following activities:  Hearing? 0 0  Vision? 0 0  Difficulty concentrating or making decisions? 0 0  Walking or climbing stairs? 1 1  Comment uses a cane   Dressing or bathing? 0 0  Doing errands, shopping? 1 1  Comment daughter Advice worker and eating ? N N  Using the Toilet? N N  In the past six months, have you accidently leaked urine? Alpha Gula  Do you have problems with loss of bowel control? N N  Managing your Medications? N N  Managing your Finances? N N  Housekeeping or managing your Housekeeping? Alpha Gula  Comment daughter assist     Patient Care Team: Westin Knotts, Donalee Citrin, NP as PCP - General (Family Medicine) [facility] (Ophthalmology)  Indicate any recent Medical Services you may have received from other than [facility] providers in the past year (date may be approximate).     Assessment:   This is a routine wellness examination for Azaleah.  Hearing/Vision screen No results found.  Dietary issues and exercise activities discussed: Current Exercise Habits: Home exercise routine, Type of exercise: stretching, Time (Minutes): 20, Frequency (Times/Week): 2, Weekly Exercise (Minutes/Week): 40, Intensity: Mild, Exercise limited by: None identified   Goals Addressed             This Visit's Progress    Patient Stated       Stay healthy       Depression Screen    05/03/2022    3:32 PM 09/27/2020    1:59 PM  PHQ 2/9 Scores  PHQ - 2 Score 2 4  PHQ- 9 Score 9 13    Fall Risk    05/03/2022    3:32 PM 01/29/2022   12:58 PM 07/27/2021    1:59 PM 09/27/2020    2:01 PM  Fall Risk   Falls in the past year? 1 1 1 1   Number falls in past yr: 1 0 0 0  Injury with Fall? 0 1 1 0  Risk for fall due to : History of fall(s);Impaired balance/gait History of fall(s);Impaired balance/gait;Impaired mobility History of fall(s);Impaired balance/gait;Impaired mobility   Follow up Falls evaluation  completed;Education provided;Falls prevention discussed Falls evaluation completed;Education provided;Falls prevention discussed Falls evaluation completed;Education provided;Falls prevention discussed     FALL RISK PREVENTION PERTAINING TO THE HOME:  Any stairs in or around the home? Yes  If so, are there any without handrails? No  Home free of loose throw rugs in walkways, pet beds, electrical cords, etc? No  Adequate lighting in your home to reduce risk of falls? Yes   ASSISTIVE DEVICES UTILIZED TO PREVENT FALLS:  Life alert? No  Use of a cane, walker or w/c? Yes  Grab bars in the bathroom? Yes  Shower chair or bench in shower? No  Elevated toilet seat or a handicapped toilet? Yes   TIMED UP AND GO:  Was the test performed? Yes .  Length of time to ambulate 10 feet: 20 sec.   Gait unsteady without use of assistive device, provider informed and interventions were implemented  Cognitive Function:    05/03/2022    3:37 PM  MMSE - Mini Mental State Exam  Orientation to time 5  Orientation to Place 4  Registration 3  Attention/ Calculation 0  Recall 2  Language- name 2 objects 2  Language- repeat 1  Language- follow 3 step command 3  Language- read & follow direction 1  Write a sentence 0  Copy design 0  Total score 21        Immunizations Immunization History  Administered Date(s) Administered   Fluad Quad(high Dose 65+) 10/17/2020, 01/29/2022   Influenza,inj,Quad PF,6+ Mos 10/30/2019   Influenza-Unspecified 10/30/2019   PFIZER(Purple Top)SARS-COV-2 Vaccination 04/09/2019, 05/08/2019   PNEUMOCOCCAL CONJUGATE-20 01/29/2022   Pfizer Covid-19 Vaccine Bivalent Booster 41yrs & up 11/08/2020   Pneumococcal Conjugate-13 02/16/2020   Tdap 03/22/2021   Zoster Recombinat (Shingrix) 11/08/2020, 03/22/2021  TDAP status: Up to date  Flu Vaccine status: Up to date  Pneumococcal vaccine status: Up to date  Covid-19 vaccine status: Completed vaccines  Qualifies for  Shingles Vaccine? Yes   Zostavax completed Yes   Shingrix Completed?: Yes  Screening Tests Health Maintenance  Topic Date Due   OPHTHALMOLOGY EXAM  Never done   MAMMOGRAM  06/04/2015   Lung Cancer Screening  02/03/2016   DEXA SCAN  Never done   Diabetic kidney evaluation - Urine ACR  03/30/2022   COVID-19 Vaccine (4 - 2023-24 season) 05/19/2022 (Originally 09/15/2021)   HEMOGLOBIN A1C  07/30/2022   INFLUENZA VACCINE  08/16/2022   Diabetic kidney evaluation - eGFR measurement  01/30/2023   FOOT EXAM  01/30/2023   Medicare Annual Wellness (AWV)  05/03/2023   Fecal DNA (Cologuard)  05/03/2023   DTaP/Tdap/Td (2 - Td or Tdap) 03/23/2031   Pneumonia Vaccine 56+ Years old  Completed   Hepatitis C Screening  Completed   Zoster Vaccines- Shingrix  Completed   HPV VACCINES  Aged Out    Health Maintenance  Health Maintenance Due  Topic Date Due   OPHTHALMOLOGY EXAM  Never done   MAMMOGRAM  06/04/2015   Lung Cancer Screening  02/03/2016   DEXA SCAN  Never done   Diabetic kidney evaluation - Urine ACR  03/30/2022    Colorectal cancer screening: Type of screening: Cologuard. Completed 05/02/2020. Repeat every 2 years  Mammogram status: Ordered appt 08/23/2022. Pt provided with contact info and advised to call to schedule appt.   Bone Density status: Ordered appt 08/23/2022. Pt provided with contact info and advised to call to schedule appt.  Lung Cancer Screening: (Low Dose CT Chest recommended if Age 68-80 years, 30 pack-year currently smoking OR have quit w/in 15years.) does qualify.   Lung Cancer Screening Referral: yes   Additional Screening:  Hepatitis C Screening: does qualify; Completed yes   Vision Screening: Recommended annual ophthalmology exams for early detection of glaucoma and other disorders of the eye. Is the patient up to date with their annual eye exam?  Yes  Who is the provider or what is the name of the office in which the patient attends annual eye exams? Dr.Groat   If pt is not established with a provider, would they like to be referred to a provider to establish care? Yes .   Dental Screening: Recommended annual dental exams for proper oral hygiene  Community Resource Referral / Chronic Care Management: CRR required this visit?  No   CCM required this visit?  No      Plan:     I have personally reviewed and noted the following in the patient's chart:   Medical and social history Use of alcohol, tobacco or illicit drugs  Current medications and supplements including opioid prescriptions. Patient is not currently taking opioid prescriptions. Functional ability and status Nutritional status Physical activity Advanced directives List of other physicians Hospitalizations, surgeries, and ER visits in previous 12 months Vitals Screenings to include cognitive, depression, and falls Referrals and appointments  In addition, I have reviewed and discussed with patient certain preventive protocols, quality metrics, and best practice recommendations. A written personalized care plan for preventive services as well as general preventive health recommendations were provided to patient.     Caesar Bookman, NP   05/03/2022   Nurse Notes: - will obtain [employer] bus forms to be completed.

## 2022-05-03 NOTE — Patient Instructions (Addendum)
[name] , Thank you for taking time to come for your Medicare Wellness Visit. I appreciate your ongoing commitment to your health goals. Please review the following plan we discussed and let me know if I can assist you in the future.   Screening recommendations/referrals: Colonoscopy : Up to date  Mammogram : appt 08/23/2022  Bone Density : appt 08/23/2022  Recommended yearly ophthalmology/optometry visit for glaucoma screening and checkup Recommended yearly dental visit for hygiene and checkup  Vaccinations: Influenza vaccine- due annually in September/October Pneumococcal vaccine Up to date  Tdap vaccine Up to date  Shingles vaccine Up to date     Advanced directives: No   Conditions/risks identified: advanced age (>7men, >78 women);diabetes mellitus;obesity (BMI >30kg/m2);smoking/ tobacco exposure;sedentary lifestyle  Next appointment: 1 year    Preventive Care 80 Years and Older, Female Preventive care refers to lifestyle choices and visits with your health care provider that can promote health and wellness. What does preventive care include? A yearly physical exam. This is also called an annual well check. Dental exams once or twice a year. Routine eye exams. Ask your health care provider how often you should have your eyes checked. Personal lifestyle choices, including: Daily care of your teeth and gums. Regular physical activity. Eating a healthy diet. Avoiding tobacco and drug use. Limiting alcohol use. Practicing safe sex. Taking low-dose aspirin every day. Taking vitamin and mineral supplements as recommended by your health care provider. What happens during an annual well check? The services and screenings done by your health care provider during your annual well check will depend on your age, overall health, lifestyle risk factors, and family history of disease. Counseling  Your health care provider may ask you questions about your: Alcohol use. Tobacco  use. Drug use. Emotional well-being. Home and relationship well-being. Sexual activity. Eating habits. History of falls. Memory and ability to understand (cognition). Work and work Astronomer. Reproductive health. Screening  You may have the following tests or measurements: Height, weight, and BMI. Blood pressure. Lipid and cholesterol levels. These may be checked every 5 years, or more frequently if you are over 35 years old. Skin check. Lung cancer screening. You may have this screening every year starting at age 34 if you have a 30-pack-year history of smoking and currently smoke or have quit within the past 15 years. Fecal occult blood test (FOBT) of the stool. You may have this test every year starting at age 41. Flexible sigmoidoscopy or colonoscopy. You may have a sigmoidoscopy every 5 years or a colonoscopy every 10 years starting at age 58. Hepatitis C blood test. Hepatitis B blood test. Sexually transmitted disease (STD) testing. Diabetes screening. This is done by checking your blood sugar (glucose) after you have not eaten for a while (fasting). You may have this done every 1-3 years. Bone density scan. This is done to screen for osteoporosis. You may have this done starting at age 59. Mammogram. This may be done every 1-2 years. Talk to your health care provider about how often you should have regular mammograms. Talk with your health care provider about your test results, treatment options, and if necessary, the need for more tests. Vaccines  Your health care provider may recommend certain vaccines, such as: Influenza vaccine. This is recommended every year. Tetanus, diphtheria, and acellular pertussis (Tdap, Td) vaccine. You may need a Td booster every 10 years. Zoster vaccine. You may need this after age 75. Pneumococcal 13-valent conjugate (PCV13) vaccine. One dose is recommended after age  65. Pneumococcal polysaccharide (PPSV23) vaccine. One dose is recommended  after age 44. Talk to your health care provider about which screenings and vaccines you need and how often you need them. This information is not intended to replace advice given to you by your health care provider. Make sure you discuss any questions you have with your health care provider. Document Released: 01/28/2015 Document Revised: 09/21/2015 Document Reviewed: 11/02/2014 Elsevier Interactive Patient Education  2017 [employer] Prevention in the Home Falls can cause injuries. They can happen to people of all ages. There are many things you can do to make your home safe and to help prevent falls. What can I do on the outside of my home? Regularly fix the edges of walkways and driveways and fix any cracks. Remove anything that might make you trip as you walk through a door, such as a raised step or threshold. Trim any bushes or trees on the path to your home. Use bright outdoor lighting. Clear any walking paths of anything that might make someone trip, such as rocks or tools. Regularly check to see if handrails are loose or broken. Make sure that both sides of any steps have handrails. Any raised decks and porches should have guardrails on the edges. Have any leaves, snow, or ice cleared regularly. Use sand or salt on walking paths during winter. Clean up any spills in your garage right away. This includes oil or grease spills. What can I do in the bathroom? Use night lights. Install grab bars by the toilet and in the tub and shower. Do not use towel bars as grab bars. Use non-skid mats or decals in the tub or shower. If you need to sit down in the shower, use a plastic, non-slip stool. Keep the floor dry. Clean up any water that spills on the floor as soon as it happens. Remove soap buildup in the tub or shower regularly. Attach bath mats securely with double-sided non-slip rug tape. Do not have throw rugs and other things on the floor that can make you trip. What can I do  in the bedroom? Use night lights. Make sure that you have a light by your bed that is easy to reach. Do not use any sheets or blankets that are too big for your bed. They should not hang down onto the floor. Have a firm chair that has side arms. You can use this for support while you get dressed. Do not have throw rugs and other things on the floor that can make you trip. What can I do in the kitchen? Clean up any spills right away. Avoid walking on wet floors. Keep items that you use a lot in easy-to-reach places. If you need to reach something above you, use a strong step stool that has a grab bar. Keep electrical cords out of the way. Do not use floor polish or wax that makes floors slippery. If you must use wax, use non-skid floor wax. Do not have throw rugs and other things on the floor that can make you trip. What can I do with my stairs? Do not leave any items on the stairs. Make sure that there are handrails on both sides of the stairs and use them. Fix handrails that are broken or loose. Make sure that handrails are as long as the stairways. Check any carpeting to make sure that it is firmly attached to the stairs. Fix any carpet that is loose or worn. Avoid having throw rugs at  the top or bottom of the stairs. If you do have throw rugs, attach them to the floor with carpet tape. Make sure that you have a light switch at the top of the stairs and the bottom of the stairs. If you do not have them, ask someone to add them for you. What else can I do to help prevent falls? Wear shoes that: Do not have high heels. Have rubber bottoms. Are comfortable and fit you well. Are closed at the toe. Do not wear sandals. If you use a stepladder: Make sure that it is fully opened. Do not climb a closed stepladder. Make sure that both sides of the stepladder are locked into place. Ask someone to hold it for you, if possible. Clearly mark and make sure that you can see: Any grab bars or  handrails. First and last steps. Where the edge of each step is. Use tools that help you move around (mobility aids) if they are needed. These include: Canes. Walkers. Scooters. Crutches. Turn on the lights when you go into a dark area. Replace any light bulbs as soon as they burn out. Set up your furniture so you have a clear path. Avoid moving your furniture around. If any of your floors are uneven, fix them. If there are any pets around you, be aware of where they are. Review your medicines with your doctor. Some medicines can make you feel dizzy. This can increase your chance of falling. Ask your doctor what other things that you can do to help prevent falls. This information is not intended to replace advice given to you by your health care provider. Make sure you discuss any questions you have with your health care provider. Document Released: 10/28/2008 Document Revised: 06/09/2015 Document Reviewed: 02/05/2014 Elsevier Interactive Patient Education  2017 [employer].

## 2022-05-04 LAB — MICROALBUMIN / CREATININE URINE RATIO
Creatinine, Urine: 348 mg/dL — ABNORMAL HIGH (ref 20–275)
Microalb Creat Ratio: 6 mg/g creat (ref <30)
Microalb, Ur: 2.1 mg/dL

## 2022-05-06 DIAGNOSIS — J449 Chronic obstructive pulmonary disease, unspecified: Secondary | ICD-10-CM | POA: Diagnosis not present

## 2022-05-14 ENCOUNTER — Encounter: Payer: Self-pay | Admitting: Family

## 2022-05-17 ENCOUNTER — Other Ambulatory Visit: Payer: Self-pay | Admitting: Internal Medicine

## 2022-05-17 DIAGNOSIS — K5900 Constipation, unspecified: Secondary | ICD-10-CM

## 2022-05-19 ENCOUNTER — Other Ambulatory Visit: Payer: Self-pay | Admitting: Internal Medicine

## 2022-05-19 DIAGNOSIS — R7303 Prediabetes: Secondary | ICD-10-CM

## 2022-05-23 ENCOUNTER — Telehealth: Payer: Self-pay

## 2022-05-23 DIAGNOSIS — J432 Centrilobular emphysema: Secondary | ICD-10-CM

## 2022-05-23 NOTE — Telephone Encounter (Signed)
Patient states she seen Dinah and discussed getting orders for a shower chair and portable oxygen, per patient " Nothing has been done."   I asked patient where would she like for order to go and her response was " Carilyn Goodpasture was suppose to figure all that out."

## 2022-05-23 NOTE — Telephone Encounter (Signed)
Will need visit to order portable oxygen.check oxygen saturation at rest,on exertion without oxygen and with oxygen on.  Will order shower chair then send to DME company desired.

## 2022-05-24 NOTE — Telephone Encounter (Signed)
Left a detailed message on voicemail informing patient she needs to schedule an appointment, per Dinah.   Mychart message was also sent with Dinah's response

## 2022-05-25 DIAGNOSIS — J9601 Acute respiratory failure with hypoxia: Secondary | ICD-10-CM | POA: Diagnosis not present

## 2022-05-28 ENCOUNTER — Other Ambulatory Visit: Payer: Self-pay | Admitting: Family

## 2022-05-29 ENCOUNTER — Other Ambulatory Visit: Payer: Self-pay | Admitting: Family

## 2022-05-29 DIAGNOSIS — I10 Essential (primary) hypertension: Secondary | ICD-10-CM

## 2022-05-30 ENCOUNTER — Telehealth: Payer: Self-pay

## 2022-05-30 NOTE — Telephone Encounter (Signed)
Patient dropped off access [place] ([employer]) form and also jury summons to be excused from jury duty. Information has been placed in review and sign folder on desk of Richarda Blade, NP.  Letter has been drafted and pended.  Message sent to Richarda Blade, NP

## 2022-05-30 NOTE — Telephone Encounter (Signed)
Jury duty letter signed.

## 2022-05-31 NOTE — Telephone Encounter (Signed)
Patient called and informed that letter is ready for pick up. Patient states that she will pick it up when she has transportation

## 2022-06-01 DIAGNOSIS — I10 Essential (primary) hypertension: Secondary | ICD-10-CM | POA: Diagnosis not present

## 2022-06-01 DIAGNOSIS — E119 Type 2 diabetes mellitus without complications: Secondary | ICD-10-CM | POA: Diagnosis not present

## 2022-06-03 ENCOUNTER — Other Ambulatory Visit: Payer: Self-pay | Admitting: Internal Medicine

## 2022-06-03 DIAGNOSIS — R7303 Prediabetes: Secondary | ICD-10-CM

## 2022-06-05 DIAGNOSIS — J449 Chronic obstructive pulmonary disease, unspecified: Secondary | ICD-10-CM | POA: Diagnosis not present

## 2022-06-06 ENCOUNTER — Inpatient Hospital Stay: Admission: RE | Admit: 2022-06-06 | Payer: 59 | Source: Ambulatory Visit

## 2022-06-07 ENCOUNTER — Telehealth: Payer: Self-pay

## 2022-06-07 NOTE — Telephone Encounter (Signed)
Completed.

## 2022-06-07 NOTE — Telephone Encounter (Signed)
Clydie Braun (patient access advocate) gave patient a copy of form and sent a copy to scanning.

## 2022-06-07 NOTE — Telephone Encounter (Signed)
Patient picked up paperwork today.

## 2022-06-07 NOTE — Telephone Encounter (Signed)
Patient called to check the status of [place] paperwork. Patient is aware paperwork is pending review and completion. Patient expressed that she has transportation and will pick up today. Patient stated " It's been long enough to have it completed."   I informed patient that I will call once complete and she replied, " I'm coming to get my paperwork."

## 2022-06-22 ENCOUNTER — Encounter: Payer: Self-pay | Admitting: Internal Medicine

## 2022-06-22 ENCOUNTER — Telehealth: Payer: Self-pay | Admitting: Internal Medicine

## 2022-06-22 ENCOUNTER — Ambulatory Visit: Payer: 59 | Attending: Internal Medicine | Admitting: Internal Medicine

## 2022-06-22 VITALS — BP 151/78 | HR 67 | Temp 98.3°F | Ht 67.0 in | Wt 207.0 lb

## 2022-06-22 DIAGNOSIS — Z7984 Long term (current) use of oral hypoglycemic drugs: Secondary | ICD-10-CM | POA: Diagnosis not present

## 2022-06-22 DIAGNOSIS — J432 Centrilobular emphysema: Secondary | ICD-10-CM

## 2022-06-22 DIAGNOSIS — E1169 Type 2 diabetes mellitus with other specified complication: Secondary | ICD-10-CM | POA: Diagnosis not present

## 2022-06-22 DIAGNOSIS — I152 Hypertension secondary to endocrine disorders: Secondary | ICD-10-CM

## 2022-06-22 DIAGNOSIS — Z9181 History of falling: Secondary | ICD-10-CM | POA: Diagnosis not present

## 2022-06-22 DIAGNOSIS — E785 Hyperlipidemia, unspecified: Secondary | ICD-10-CM

## 2022-06-22 DIAGNOSIS — Z1331 Encounter for screening for depression: Secondary | ICD-10-CM

## 2022-06-22 DIAGNOSIS — E1159 Type 2 diabetes mellitus with other circulatory complications: Secondary | ICD-10-CM

## 2022-06-22 DIAGNOSIS — J9611 Chronic respiratory failure with hypoxia: Secondary | ICD-10-CM | POA: Diagnosis not present

## 2022-06-22 DIAGNOSIS — Z7689 Persons encountering health services in other specified circumstances: Secondary | ICD-10-CM | POA: Diagnosis not present

## 2022-06-22 DIAGNOSIS — Z9981 Dependence on supplemental oxygen: Secondary | ICD-10-CM | POA: Diagnosis not present

## 2022-06-22 DIAGNOSIS — E669 Obesity, unspecified: Secondary | ICD-10-CM | POA: Diagnosis not present

## 2022-06-22 LAB — POCT GLYCOSYLATED HEMOGLOBIN (HGB A1C): HbA1c, POC (controlled diabetic range): 6.4 % (ref 0.0–7.0)

## 2022-06-22 LAB — GLUCOSE, POCT (MANUAL RESULT ENTRY): POC Glucose: 131 mg/dl — AB (ref 70–99)

## 2022-06-22 NOTE — Progress Notes (Signed)
[name], female    DOB: 1955-09-14  MRN: [id number]  CC: Establish Care (Est care / new pt./Requesting a shower chair & a portable oxygen machine. )   Subjective: [name] is a 67 y.o. female who presents for new pt visit. Her concerns today include:  ` Patient with history of HTN, HL, COPD on O2, DM type II, anxiety/depression,  Previous PCP was a Publishing rights manager at [employer].  Decided to change because she felt she was not doing much for her.  Prior to that she was seeing Dr. Delrae Alfred.   DM: Results for orders placed or performed in visit on 06/22/22  POCT glucose (manual entry)  Result Value Ref Range   POC Glucose 131 (A) 70 - 99 mg/dl  POCT glycosylated hemoglobin (Hb A1C)  Result Value Ref Range   Hemoglobin A1C     HbA1c POC (<> result, manual entry)     HbA1c, POC (prediabetic range)     HbA1c, POC (controlled diabetic range) 6.4 0.0 - 7.0 %  On Metformin-XR 500 mg BID.  Compliant with taking the medication. Craves sweets at times.   Taking and tolerating Lipitor for cholesterol.  Most recent LDL done in January of this year was 81.  HTN:  Did not take meds as yet this a.m.  On Lis/HCTZ 20/12.5 mg daily, Coreg 12.5 mg twice a day and Norvasc 5 mg daily Tries to limit salt in the foods but tells me that she eats sausage and bacon. -has home BP device but not check recently  COPD:  on 2L O2 continuously x several yrs.  Wants smaller/portable O2, has rolling tank but too heavy.  Gets O2 through [facility]. She tells me that she was diagnosed with sarcoid back in the 1980s.  However I do not see any diagnosis of that on her chart currently or on Dr. Reginia Naas last note On Trelegy daily and uses ProAir about 3-4x/day.  No recent productive cough.  She is also on Singulair at bedtime. Last saw Dr. Vassie Loll 02/2021  Request shower chair.  Slipped and fell in her tub about 2 mths ago.  She lives alone in an apartment. Patient Active Problem List    Diagnosis Date Noted   Nondisplaced fracture of proximal phalanx of left ring finger 07/25/2021   Displaced fracture of proximal phalanx of left little finger, initial encounter for closed fracture 07/07/2021   Onychomycosis of toenail 03/22/2021   Dry skin dermatitis 03/22/2021   DM type 2 (diabetes mellitus, type 2) (HCC) 02/18/2021   COPD GOLD 0  11/08/2020   Depression 11/08/2020   Mixed hyperlipidemia 11/08/2020   Chronic respiratory failure with hypoxia (HCC) 10/17/2020   Mild sleep apnea 10/17/2020   Centrilobular emphysema (HCC) 06/26/2014   Primary hypertension 06/26/2014   Dyspnea 06/25/2014     Current Outpatient Medications on File Prior to Visit  Medication Sig Dispense Refill   albuterol (PROAIR HFA) 108 (90 Base) MCG/ACT inhaler Inhale 1-2 puffs into the lungs every 6 (six) hours as needed for wheezing or shortness of breath. 18 g 2   amLODipine (NORVASC) 5 MG tablet TAKE 1 TABLET BY MOUTH DAILY 90 tablet 1   aspirin 81 MG tablet Take 81 mg by mouth daily.     atorvastatin (LIPITOR) 80 MG tablet TAKE 1 TABLET BY MOUTH DAILY 90 tablet 1   Camphor-Eucalyptus-Menthol (VICKS VAPORUB EX) Apply 1 application topically 2 (two) times daily as needed (breathe and soreness).  carvedilol (COREG) 12.5 MG tablet TAKE 1 TABLET BY MOUTH TWICE  DAILY WITH MEALS 180 tablet 1   Continuous Blood Gluc Sensor (FREESTYLE LIBRE 14 DAY SENSOR) MISC Check blood glucose 3 times daily before meals and as needed 2 each 12   esomeprazole (NEXIUM) 40 MG capsule Take 1 capsule (40 mg total) by mouth daily. 90 capsule 3   FLUoxetine (PROZAC) 40 MG capsule Take 1 capsule (40 mg total) by mouth daily. 90 capsule 1   linaclotide (LINZESS) 145 MCG CAPS capsule Take 1 capsule (145 mcg total) by mouth daily before breakfast. 90 capsule 3   lisinopril-hydrochlorothiazide (ZESTORETIC) 20-12.5 MG tablet TAKE 1 TABLET BY MOUTH DAILY 90 tablet 1   metFORMIN (GLUCOPHAGE-XR) 500 MG 24 hr tablet TAKE 1 TABLET BY  MOUTH TWICE  DAILY WITH MEALS 160 tablet 1   montelukast (SINGULAIR) 10 MG tablet TAKE 1 TABLET BY MOUTH AT  BEDTIME 90 tablet 1   Naproxen Sodium (ALEVE PO) Take 1 tablet by mouth at bedtime as needed.     nitroGLYCERIN (NITROSTAT) 0.3 MG SL tablet DISSOLVE 1 TABLET UNDER THE  TONGUE EVERY 5 MINUTES AS NEEDED FOR CHEST PAIN. MAX OF 3 TABLETS IN 15 MINUTES. CALL 911 IF PAIN  PERSISTS. 100 tablet 3   tiZANidine (ZANAFLEX) 2 MG tablet Take 2 mg by mouth every 6 (six) hours as needed.     traZODone (DESYREL) 50 MG tablet Take 1 tablet (50 mg total) by mouth at bedtime as needed. 90 tablet 3   TRELEGY ELLIPTA 100-62.5-25 MCG/ACT AEPB USE 1 INHALATION BY MOUTH  ONCE DAILY AT THE SAME TIME EACH DAY 180 each 3   No current facility-administered medications on file prior to visit.    Allergies  Allergen Reactions   Flonase [Fluticasone Propionate]     Nose bleeds    Social History   Socioeconomic History   Marital status: Single    Spouse name: Not on file   Number of children: Not on file   Years of education: Not on file   Highest education level: Not on file  Occupational History   Not on file  Tobacco Use   Smoking status: Former    Packs/day: 1.00    Years: 38.00    Additional pack years: 0.00    Total pack years: 38.00    Types: Cigarettes    Quit date: 01/16/2008    Years since quitting: 14.4   Smokeless tobacco: Never  Vaping Use   Vaping Use: Never used  Substance and Sexual Activity   Alcohol use: No    Alcohol/week: 0.0 standard drinks of alcohol   Drug use: No   Sexual activity: Not on file  Other Topics Concern   Not on file  Social History Narrative   Not on file   Social Determinants of Health   Financial Resource Strain: Not on file  Food Insecurity: Not on file  Transportation Needs: Not on file  Physical Activity: Not on file  Stress: Not on file  Social Connections: Not on file  Intimate Partner Violence: Not on file    Family History  Problem  Relation Age of Onset   Allergies Mother    Allergies Daughter    Asthma Son        had as a child    Past Surgical History:  Procedure Laterality Date   TEE WITHOUT CARDIOVERSION N/A 08/24/2014   Procedure: TRANSESOPHAGEAL ECHOCARDIOGRAM (TEE);  Surgeon: Yates Decamp, MD;  [facility] END[facility];  Service:  Cardiovascular;  Laterality: N/A;   TOTAL ABDOMINAL HYSTERECTOMY  01/15/2005    ROS: Review of Systems Negative except as stated above  PHYSICAL EXAM: BP (!) 151/78 (BP Location: Left Arm, Patient Position: Sitting, Cuff Size: Normal)   Pulse 67   Temp 98.3 F (36.8 C) (Oral)   Ht 5\' 7"  (1.702 m)   Wt 207 lb (93.9 kg)   SpO2 93%   BMI 32.42 kg/m   Pox of 93% on 2 Lt O2. Physical Exam Repeat blood pressure 153/88 General appearance - alert, well appearing, older African-American female sitting in chair and in no distress.  She is using her portable oxygen which is set to 2 L. Mental status -patient is very talkative.  She answers questions appropriately Eyes -Pink conjunctiva. Mouth -oral mucosa is moist. Neck - supple, no significant adenopathy Chest -breath sounds mildly decreased without wheezes or crackles at this time Heart - normal rate, regular rhythm, normal S1, S2, no murmurs, rubs, clicks or gallops Extremities -no lower extremity edema. Diabetic Foot Exam - Simple   Simple Foot Form Diabetic Foot exam was performed with the following findings: Yes 06/22/2022 11:02 AM  Visual Inspection No deformities, no ulcerations, no other skin breakdown bilaterally: Yes Sensation Testing Intact to touch and monofilament testing bilaterally: Yes Pulse Check Posterior Tibialis and Dorsalis pulse intact bilaterally: Yes Comments        06/22/2022    9:55 AM 05/03/2022    3:32 PM 09/27/2020    1:59 PM  Depression screen PHQ 2/9  Decreased Interest 2 1 2   Down, Depressed, Hopeless 2 1 2   PHQ - 2 Score 4 2 4   Altered sleeping 3 2 2   Tired, decreased energy 3 2 2    Change in appetite 2 2 2   Feeling bad or failure about yourself  2 1 1   Trouble concentrating 2  2  Moving slowly or fidgety/restless 1 0 0  Suicidal thoughts 2 0 0  PHQ-9 Score 19 9 13   Difficult doing work/chores  Not difficult at all        Latest Ref Rng & Units 01/29/2022    2:03 PM 08/03/2021    9:17 AM 03/29/2021   12:00 AM  CMP  Glucose 65 - 139 mg/dL 92  97  161   BUN 7 - 25 mg/dL 10  10  12    Creatinine 0.50 - 1.05 mg/dL 0.96  0.45  4.09   Sodium 135 - 146 mmol/L 145  141  139   Potassium 3.5 - 5.3 mmol/L 3.9  3.8  4.0   Chloride 98 - 110 mmol/L 110  107  105   CO2 20 - 32 mmol/L 23  23  22    Calcium 8.6 - 10.4 mg/dL 81.1  91.4  78.2   Total Protein 6.1 - 8.1 g/dL 7.0  6.7  6.5   Total Bilirubin 0.2 - 1.2 mg/dL 0.5  0.4  0.4   Alkaline Phos 44 - 121 IU/L   66   AST 10 - 35 U/L 16  22  22    ALT 6 - 29 U/L 20  30  27     Lipid Panel     Component Value Date/Time   CHOL 172 01/29/2022 1403   CHOL 166 03/29/2021 0000   TRIG 128 01/29/2022 1403   HDL 56 01/29/2022 1403   HDL 51 03/29/2021 0000   CHOLHDL 3.1 01/29/2022 1403   LDLCALC 94 01/29/2022 1403    CBC    Component Value  Date/Time   WBC 5.7 01/29/2022 1403   RBC 4.98 01/29/2022 1403   HGB 14.9 01/29/2022 1403   HGB 13.9 11/08/2020 1509   HCT 45.2 (H) 01/29/2022 1403   HCT 41.8 11/08/2020 1509   PLT 237 01/29/2022 1403   PLT 217 11/08/2020 1509   MCV 90.8 01/29/2022 1403   MCV 90 11/08/2020 1509   MCH 29.9 01/29/2022 1403   MCHC 33.0 01/29/2022 1403   RDW 12.8 01/29/2022 1403   RDW 12.6 11/08/2020 1509   LYMPHSABS 1,636 01/29/2022 1403   LYMPHSABS 1.8 11/08/2020 1509   MONOABS 0.3 08/01/2008 1640   EOSABS 57 01/29/2022 1403   EOSABS 0.1 11/08/2020 1509   BASOSABS 29 01/29/2022 1403   BASOSABS 0.0 11/08/2020 1509    ASSESSMENT AND PLAN: 1. Encounter to establish care   2. Type 2 diabetes mellitus with obesity (HCC) A1c is at goal.  I commended her on this.  Encourage healthy eating habits  and printed information given on discharge summary.. - POCT glucose (manual entry) - POCT glycosylated hemoglobin (Hb A1C) - Ambulatory referral to Ophthalmology  3. Centrilobular emphysema (HCC) Continue Trelegy and ProAir inhalers.  We will get her back in with her pulmonologist since she has not seen him in over 1 year. - Ambulatory referral to Pulmonology - For home use only DME Other see comment  4. Chronic hypoxic respiratory failure, on home oxygen therapy (HCC) I will have our caseworker touch base with [facility] about getting her a small portable oxygen concentrator. - Ambulatory referral to Pulmonology - For home use only DME Other see comment  5. Hypertension associated with type 2 diabetes mellitus (HCC) Not at goal.  However patient has not taken her medicines as yet for the morning.  She will take them when she returns home.  Advised to check blood pressure at least once to twice a week with goal being 130/80 or lower.  Will have her follow-up with the clinical pharmacist in 1 month for repeat blood pressure check. Low-salt diet encouraged. Continue lisinopril/HCTZ, carvedilol 12.5 mg twice a day and amlodipine 5 mg daily.  6. Hyperlipidemia due to type 2 diabetes mellitus (HCC) Continue atorvastatin 80 mg daily.  7. History of recent fall Prescription written for shower chair - For home use only DME Other see comment  8. Positive depression screening Depression screen results was not seen until patient had already left.  I tried calling her and got an automated voicemail but I did not leave a message.  Message sent to our LCSW to follow-up with her.    Patient was given the opportunity to ask questions.  Patient verbalized understanding of the plan and was able to repeat key elements of the plan.   This documentation was completed using Paediatric nurse.  Any transcriptional errors are unintentional.  Orders Placed This Encounter  Procedures   For  home use only DME Other see comment   For home use only DME Other see comment   Ambulatory referral to Pulmonology   Ambulatory referral to Ophthalmology   POCT glucose (manual entry)   POCT glycosylated hemoglobin (Hb A1C)     Requested Prescriptions    No prescriptions requested or ordered in this encounter    Return in about 4 months (around 10/22/2022) for Appt with [facility] in 4 wks for BP check.  Jonah Blue, MD, FACP

## 2022-06-22 NOTE — Patient Instructions (Signed)

## 2022-06-25 ENCOUNTER — Encounter (INDEPENDENT_AMBULATORY_CARE_PROVIDER_SITE_OTHER): Payer: Self-pay

## 2022-06-25 ENCOUNTER — Telehealth: Payer: Self-pay

## 2022-06-25 DIAGNOSIS — J9601 Acute respiratory failure with hypoxia: Secondary | ICD-10-CM | POA: Diagnosis not present

## 2022-06-25 NOTE — Telephone Encounter (Signed)
Received and called pt to address her mental health issues. Pt stated that she is doing ok. Often stays up at night because she struggles to sleep at night. She stated she would like resource sent via MyChart.  Counseling Resources   [email or web address]  [facility] [address], [place], [place] [address] [phone] or [phone] Walk-in urgent care 24/7 for anyone  For [facility] ONLY New patient assessments and therapy walk-ins: Monday and Wednesday 8am-11am First and second Friday 1pm-5pm New patient psychiatry and medication management walk-ins: Mondays, Wednesdays, Thursdays, Fridays 8am-11am No psychiatry walk-ins on Tuesday   *Accepts all insurance and uninsured for Urgent Care needs *Accepts Medicaid and uninsured for outpatient treatment   [facility] (Therapy and psychiatry) [facility] (near [employer]) [address], [address] [place], [place] [address] [phone] Fax: [phone] (INSURANCE REQUIRED-MEDICAID ACCEPTED)   [facility] Address: Four Locations  -[address] [place], [place], [place]                                 Phone: [phone] -[address], [place], [place]         Phone: [phone] -[address] [place], [place]                      Phone: [phone] -[address], [place], [place]         Phone: [phone] Age Range: Children, Adolescents, and Adults Specialty Areas: Depression, Anxiety, ADHD, Substance Abuse, Bipolar Disorder, etc.  [facility] Address: [address], [place], [place] Phone: [phone] Age Range: Children, Adults, and Elders Specialty Areas: Couple, Family, Group, Individual     [facility] Address: [address] [place], [place] Phone: [phone] Age Range: Children, Adolescents, and  Adults Specialty Areas: Individual, Family and Couples Therapy, and Substance Abuse  [facility] Address: [address] [place], [place] [address] Phone: [phone] Age Range: Adolescents/Teenagers and Adults Specialty Areas: Individual, Family, Couples, and Group Counseling   [facility] Address: [address] [place], [place] Phone: [phone] Age Range: Children and Adults Specialty Areas: Individual, Group, and Family counseling to people who have experienced domestic violence or sexual assault  [facility] Address: [address] [place], [place] [address] Phone: [phone] Age Range: Adults & Children (Ages 3+) Specialty Areas: Mental Health and Substance Abuse Counseling Services  [facility] Address: [address] [place], [place], [place] [address] Phone: [phone]             Age Range: All Ages  Specialty Areas: Common mental health diagnoses such as Anxiety, Depression, ADD/ADHD, Bipolar, and PTSD, Substance Abuse Evaluations and Counseling   [facility] at [facility] [address] [place] [address] [place],  [place]  [address] [phone] Call for appointment  [facility] (Therapy only)  [facility] [address] [place], [place] [address] Monday - Friday: 8:30 a.m.-12 p.m. / 1 p.m.-2:30 p.m.  [facility] [address], [place], [place] [address] Monday-Friday: 8:30 a.m.-12 p.m. / 2-3:30 p.m. (INSURANCE REQUIRED -MEDICAID ACCEPTED) They do offer a sliding fee scale $20-$30/session   [facility] [address] [place], [place] [address] Phone: [phone]  [facility] [address] [place]  [place] [address]  Phone: [phone] (Does not accept Medicaid) (only one provider accepts Medicare)  [facility] [address] (at [employer], [place]  [phone] (Accepts Medicaid and Medicare)  [facility]) [address]  [place], [place] [address]  Phone: [phone]  [address] [place], [place] [address] Phone: [phone] [facility] Medicaid) [facility] (Therapy only)  [address], [place], [place] [address] Phone: [phone]   [facility] (Therapy only)  [address] [place], [place] [address] Phone: [phone] [facility] & Medicare)   [facility] [address], Suite [place], [place] [address] Phone: [phone] (Accepts [place] Health Choice) [facility] [phone] [address] [place], [place] [address] Phone: [phone] [facility]) [facility] (Psychiatry only)  [phone] [address], [place], [place] [address] (Accepts Medicaid and Medicare) [facility] (Psychiatry and therapy)  [address] [place] [place], [place] [address] [phone] [facility] Medicare) [facility] (psychiatry and therapy) [address], [place], [place] [address] [phone]  [facility] at [address], [place], [place] [address] [phone] Accept [address] [place], [place], [place], [place],  and the following types of Medicaid; Alliance, [place], Partners, [place], [place] Health Choice, [employer], Healthy [place], [place], and [place], as well as offering a Careers adviser and private payment options. Provides In-Office Appointments, Virtual Appointments, and Phone Consultations Offers medication management for ages 51 years old and up, including,  Medication Management for Suboxone and Proofreader Medicine [phone] [address], [place], [place] [address] (Accepts Medicaid and Medicare)         19.  [facility] (therapy only)  [address] [place], [place] [address]             [phone] (Accepts medicare) 20. [facility]  (Suboxone and methodone) [phone] [address] [place], [place] [address] To Be Eligible for Opioid Treatment at [facility] you must be at least 67 years of age you have already tried other interventions that were not successful such as opioid detox, inpatient rehab for opioids, or outpatient counseling specifically for opioid dependency your [facility] drug test must be completely free of benzodiazepines (klonopin, xanax, valium, ativan, or other benz) you have reliable transportation to the [facility] clinic in [place] you recognize that counseling is a critical component of [facility]' Opioid Program and you agree to attend all required counseling sessions you are committed to total drug abstinence and will conscientiously strive to remain free of alcohol, marijuana, and other illicit substances while in treatment you desire a peaceful treatment atmosphere in which personal responsibility and respect toward staff and clients is the norm   21. [facility] [address] [place], [place], [place] [address] Offers SAIOP (Substance Abuse Intensive Outpatient Program) [phone] 22. [facility] [address] [place], [place] [address] [phone] (Accepts medicare)  For those who are tech savvy, go on psychology today, type in your local city (i.e. [place]. [place]) and specify your insurance at the top of the screen after you search. (Medicaid if needed). You can also specify whether you are interested in therapy and psychiatry.  www.psychologytoday.com/us

## 2022-06-25 NOTE — Telephone Encounter (Signed)
Order for POC faxed to [facility] 

## 2022-06-25 NOTE — Telephone Encounter (Signed)
Received and called pt 

## 2022-07-03 ENCOUNTER — Encounter: Payer: Self-pay | Admitting: Family

## 2022-07-03 ENCOUNTER — Other Ambulatory Visit: Payer: Self-pay | Admitting: Internal Medicine

## 2022-07-03 DIAGNOSIS — R7303 Prediabetes: Secondary | ICD-10-CM

## 2022-07-06 DIAGNOSIS — J449 Chronic obstructive pulmonary disease, unspecified: Secondary | ICD-10-CM | POA: Diagnosis not present

## 2022-07-11 ENCOUNTER — Telehealth: Payer: Self-pay | Admitting: Internal Medicine

## 2022-07-11 NOTE — Telephone Encounter (Signed)
Called & spoke to the patient. Verified name & DOB. Informed that a prescription was sent to a medical supplier called [facility]. [facility] informed us that this iteam is not covered by her insurance and do not cover any bathroom items. Informed patient that she may have to purchase out-of-pocket. Informed patient that there are shower chairs available at [place] and [employer] at a more affordable price. Patient expressed verbal understanding. No further questions at this time.

## 2022-07-13 DIAGNOSIS — E119 Type 2 diabetes mellitus without complications: Secondary | ICD-10-CM | POA: Diagnosis not present

## 2022-07-16 ENCOUNTER — Telehealth: Payer: Self-pay

## 2022-07-16 NOTE — Telephone Encounter (Signed)
Call made to [facility] to check on status of order for portable O2. A rep by the name of Kimmy informed me that the patient would need to use O2 from [facility] for two more months before she qualified for the portable O2.

## 2022-07-20 ENCOUNTER — Ambulatory Visit: Payer: 59 | Admitting: Pharmacist

## 2022-07-24 ENCOUNTER — Telehealth: Payer: Self-pay

## 2022-07-24 NOTE — Telephone Encounter (Signed)
I spoke to Eusebio Me, [place] to further clarify the need for patient to use more O2 tanks before she is eligible for a POC. He explained that is a Chartered certified accountant. However, he said that as of 08/16/2022, she will have met their criteria for O2 usage and would qualify for a POC  He also said that the order they have for the POC is sufficient and they do not need another order.   I told him that I will call the patient and explain the status of the order.

## 2022-07-25 DIAGNOSIS — J9601 Acute respiratory failure with hypoxia: Secondary | ICD-10-CM | POA: Diagnosis not present

## 2022-07-25 NOTE — Telephone Encounter (Signed)
I explained to the patient that per [facility], she would be eligible for a POC 08/16/2022 and that she can call [facility] if she has any questions.

## 2022-07-26 IMAGING — DX DG HAND COMPLETE 3+V*L*
3 series · 3 of 3 positions shown · non-contrast
Comparison: None Available.

CLINICAL DATA: Fell 2 days ago on outstretched hand. Pain and
swelling of the mid metacarpophalangeal areas. Radial sided wrist
pain.

EXAM:
LEFT WRIST - COMPLETE 3+ VIEW; LEFT HAND - COMPLETE 3+ VIEW

[hand pa]
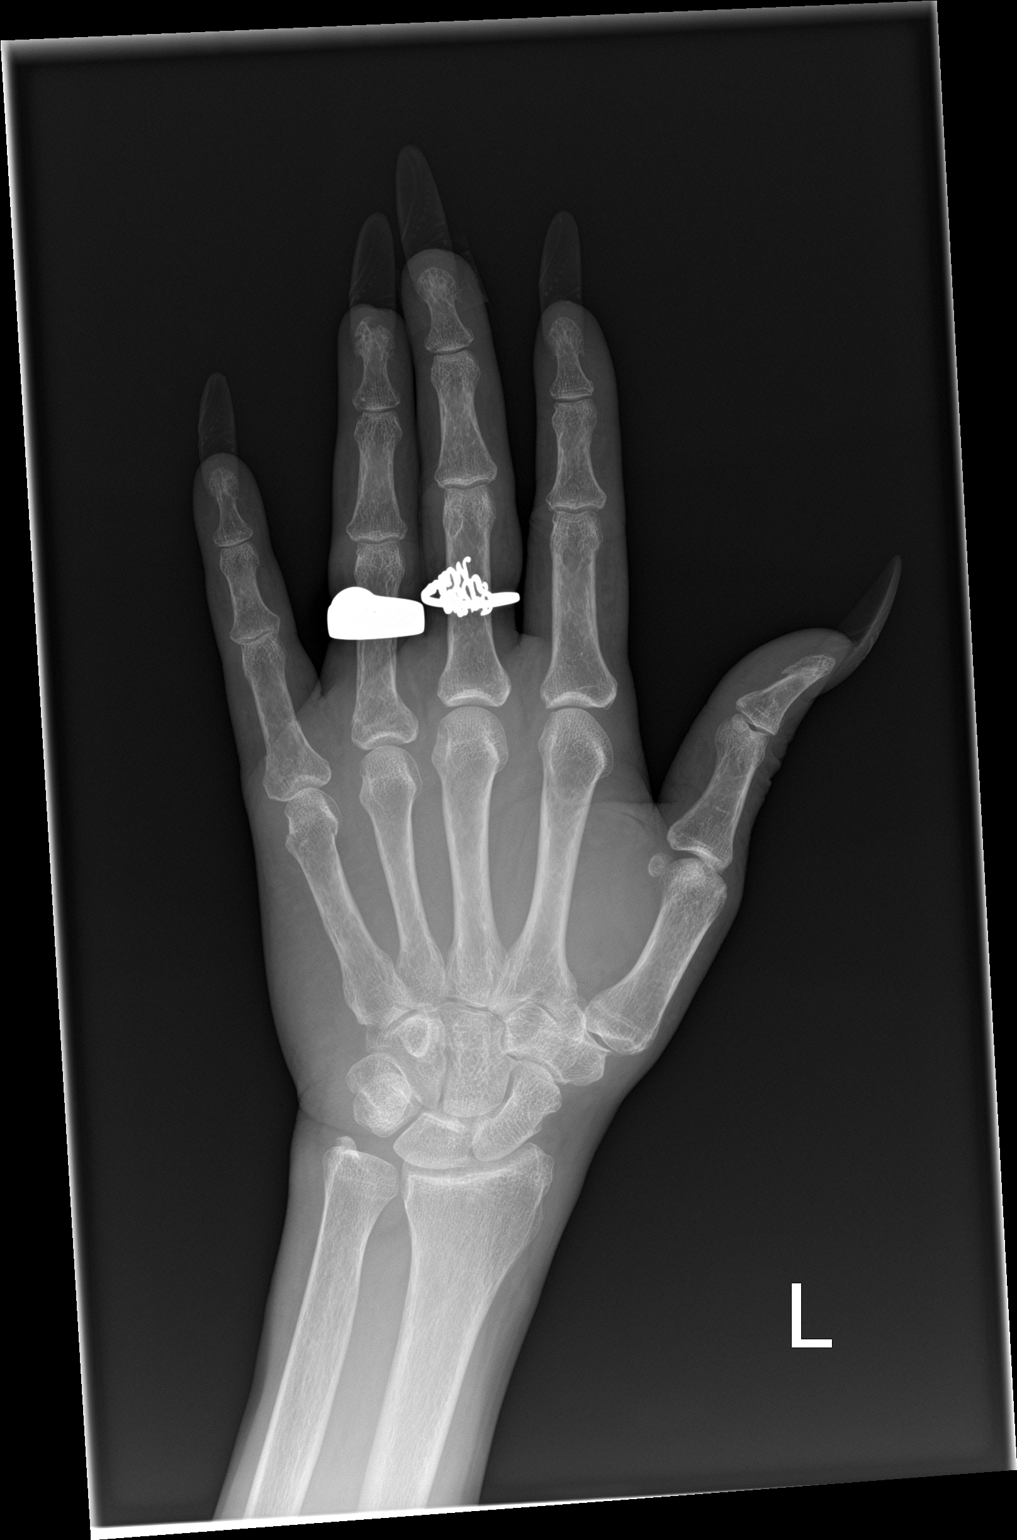

[hand obl]
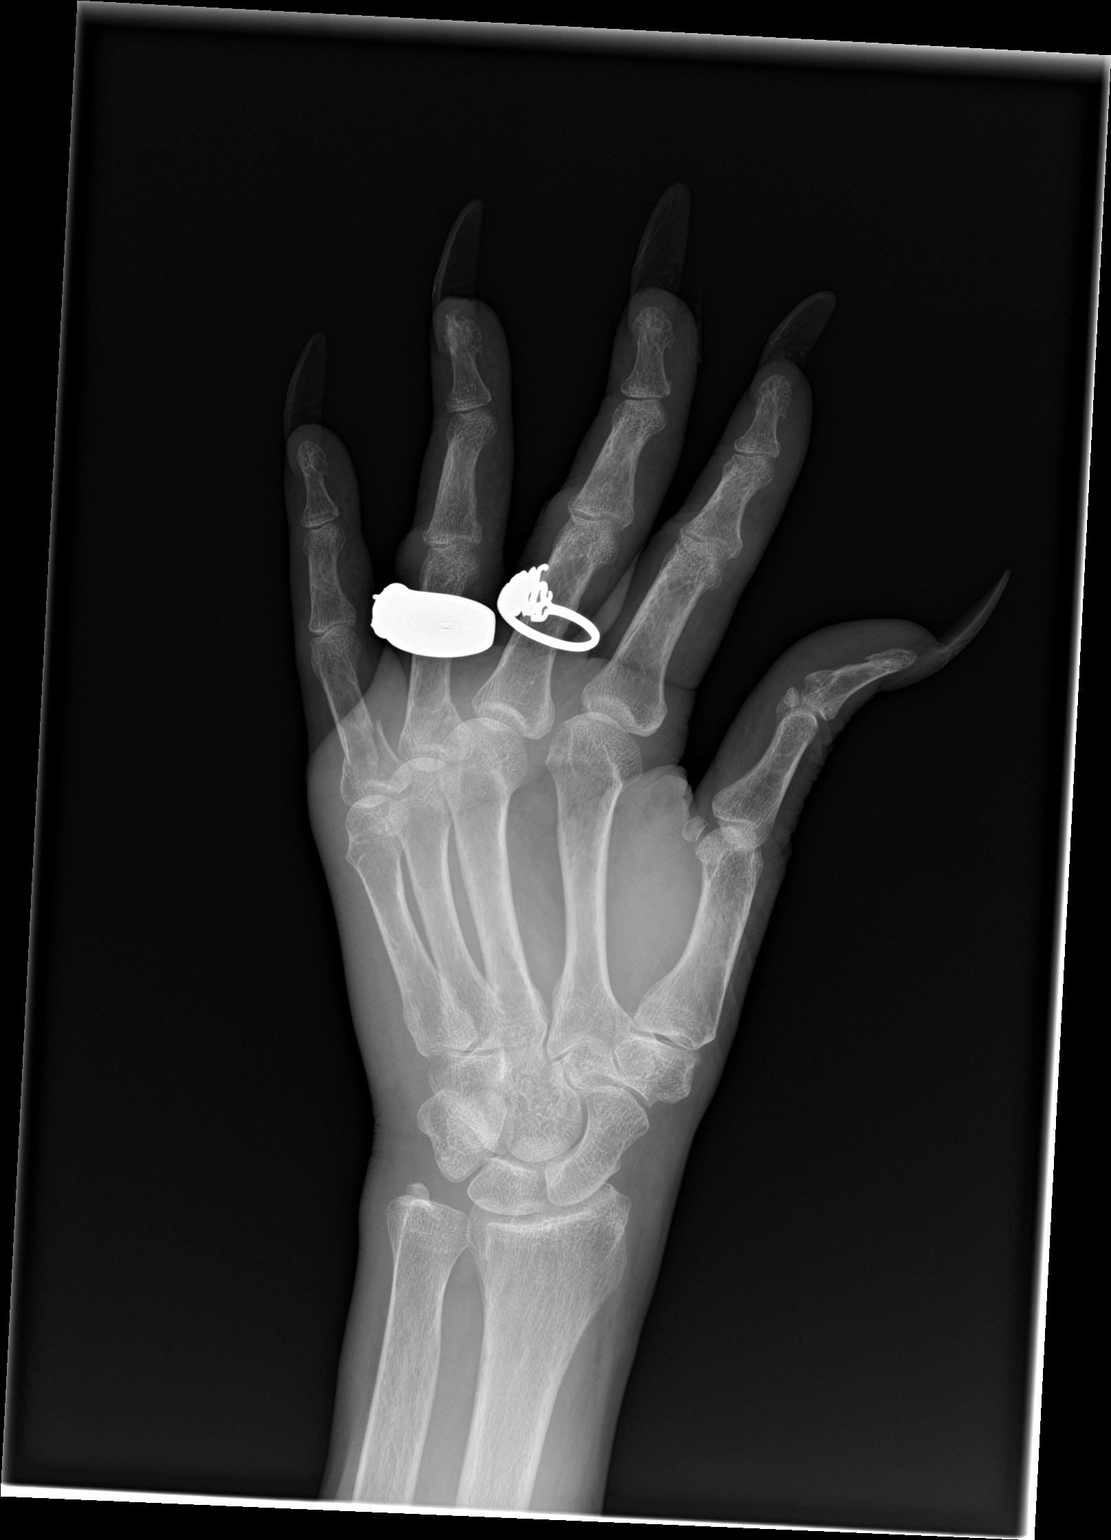

[hand lat]
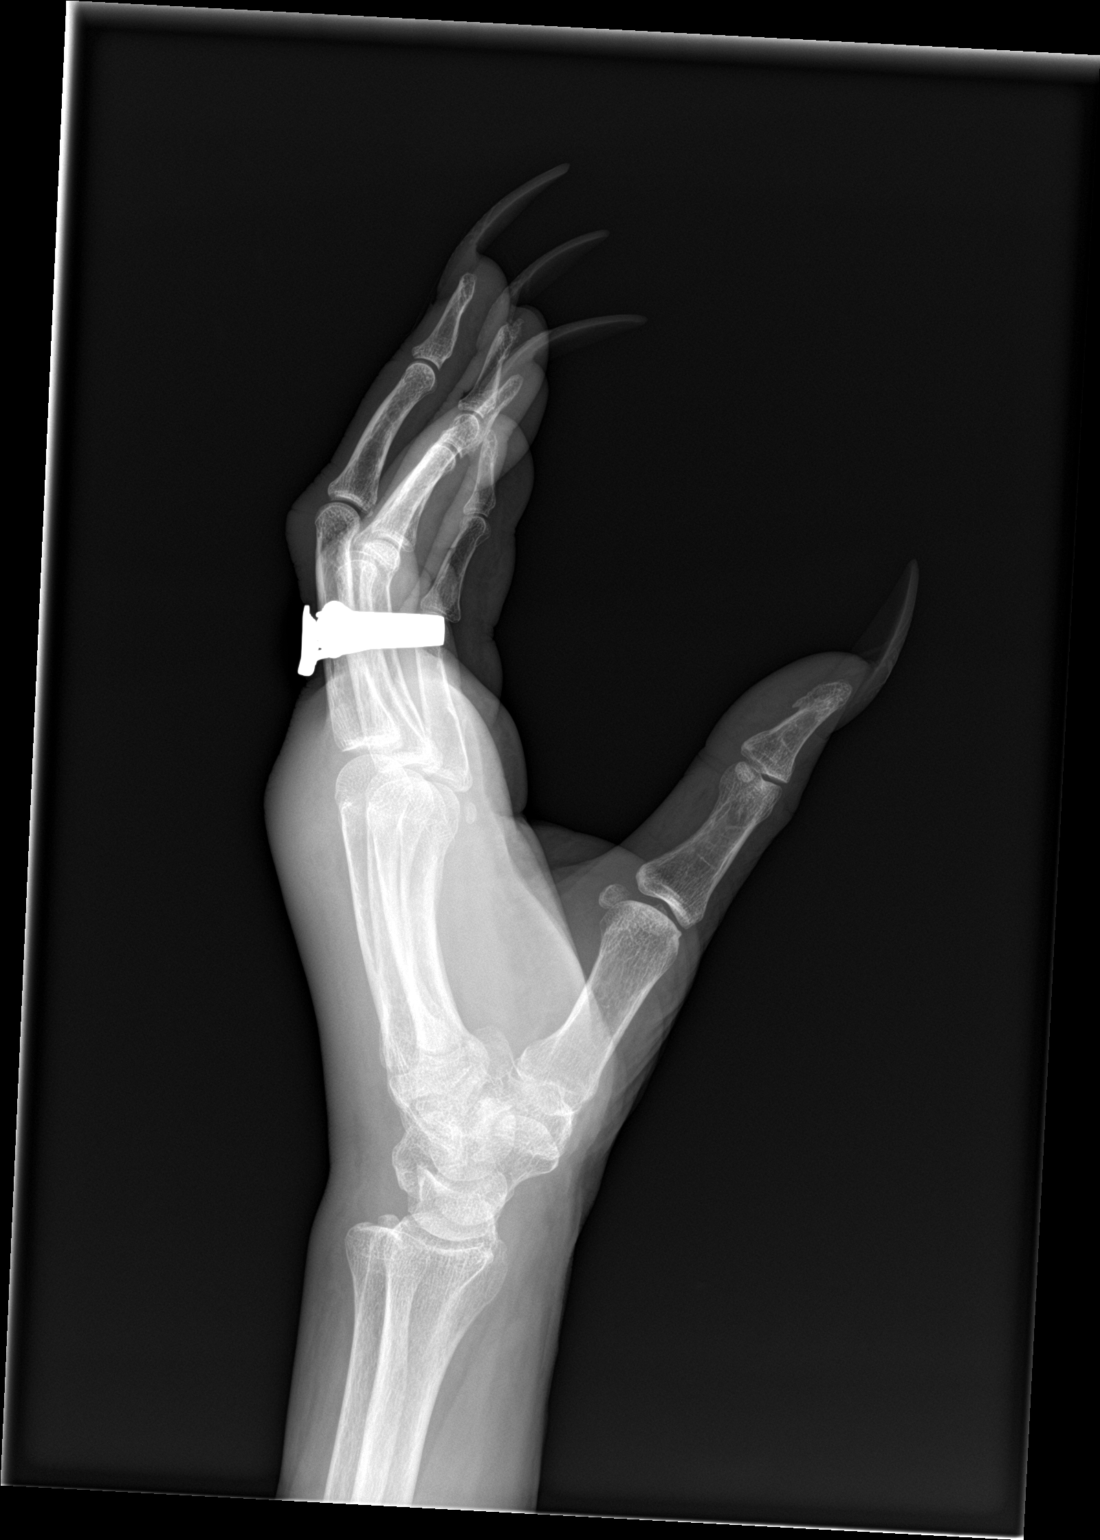

[3 of 3 positions shown; findings below may reference images not displayed]

FINDINGS: Left wrist:

Minimal 1 mm ulnar positive variance. Mild triscaphe and moderate
thumb carpometacarpal joint space narrowing and peripheral
osteophytosis. Mild rest of the intercarpal joint space narrowing
diffusely.

Left hand:

There is diffuse decreased bone mineralization. Artificial nails are
incidentally noted. Rings overlie the proximal phalanges of the
third and fourth fingers. There is curvilinear lucency indicating an
acute fracture of the proximal metaphysis of the proximal phalanx of
the fifth finger with minimal volar apex angulation but otherwise no
significant displacement.

Mild joint space narrowing of the interphalangeal joints diffusely.
IMPRESSION: 1. Acute fracture of the proximal metaphysis of the proximal phalanx
of fifth finger with minimal volar apex angulation but otherwise no
significant displacement.
2. Mild triscaphe and moderate thumb carpometacarpal osteoarthritis.

## 2022-07-30 ENCOUNTER — Ambulatory Visit: Payer: Medicare Other | Admitting: Family

## 2022-08-05 DIAGNOSIS — J449 Chronic obstructive pulmonary disease, unspecified: Secondary | ICD-10-CM | POA: Diagnosis not present

## 2022-08-23 ENCOUNTER — Ambulatory Visit: Payer: 59

## 2022-08-23 ENCOUNTER — Inpatient Hospital Stay: Admission: RE | Admit: 2022-08-23 | Payer: 59 | Source: Ambulatory Visit

## 2022-08-24 ENCOUNTER — Other Ambulatory Visit: Payer: Self-pay | Admitting: Family

## 2022-08-24 DIAGNOSIS — F32A Depression, unspecified: Secondary | ICD-10-CM

## 2022-08-25 DIAGNOSIS — J9601 Acute respiratory failure with hypoxia: Secondary | ICD-10-CM | POA: Diagnosis not present

## 2022-08-29 ENCOUNTER — Ambulatory Visit (HOSPITAL_BASED_OUTPATIENT_CLINIC_OR_DEPARTMENT_OTHER): Payer: 59 | Admitting: Pulmonary Disease

## 2022-08-30 ENCOUNTER — Telehealth: Payer: Self-pay | Admitting: Internal Medicine

## 2022-08-30 DIAGNOSIS — Z9981 Dependence on supplemental oxygen: Secondary | ICD-10-CM

## 2022-08-30 DIAGNOSIS — J432 Centrilobular emphysema: Secondary | ICD-10-CM

## 2022-08-30 NOTE — Telephone Encounter (Signed)
Pt is calling in because she has contacted [facility] and say she has not received her Portable oxygen tanks as of yet. PT says they told her she needed to wait until August and it's August and she still hasn't heard anything regarding the tanks. Pt says they told her they would pick up her old tanks and she says no one has come to do that. Pt would like to know could the office reach out to [facility] to help with the process. Please advise.

## 2022-08-31 NOTE — Telephone Encounter (Signed)
Pt states that she has called Terri and she will not answer the phone nor call her back. Pt states that she called her insurance company [facility] and they gave her a list of numbers to call. She called [facility] and they advised her to have her PCP send over a fax with the prescription to she can get her oxygen.    Fax number: [phone]

## 2022-08-31 NOTE — Telephone Encounter (Signed)
Call placed to [employer] spoke with Terri. Terri said they have spoken to patient on several occasion about this matter. It has been explained to her that she must use/ order 8 tanks monthly for 3 consecutive months and she must agree to the terms before she can get the portable tanks.Call placed to patient . Patient given the information that was given to me by Terri . Patient was not happy voiced that she  has done all that and [employer] still wont do what they are suppose to do. Advised that she should call [employer] and make sure that all the terms of the agreement has been met, so she can ger her portable tank. Patient added that  [employer] had not picked up the use tanks either. Advised that she should call [employer] ask for Terri the person I spoke with and inform her  that her oxygen tanks  have not been picked up and tell Terri that she feels that terms of the agreement has be met and ask if they would please come and pick up her oxygen tanks.

## 2022-09-03 ENCOUNTER — Encounter (HOSPITAL_BASED_OUTPATIENT_CLINIC_OR_DEPARTMENT_OTHER): Payer: Self-pay | Admitting: Pulmonary Disease

## 2022-09-03 NOTE — Telephone Encounter (Signed)
I spoke with the patient,  scheduled for walk test with nurse.

## 2022-09-03 NOTE — Addendum Note (Signed)
Addended by: Jonah Blue B on: 09/03/2022 01:26 PM   Modules accepted: Orders

## 2022-09-05 DIAGNOSIS — J449 Chronic obstructive pulmonary disease, unspecified: Secondary | ICD-10-CM | POA: Diagnosis not present

## 2022-09-07 DIAGNOSIS — H40013 Open angle with borderline findings, low risk, bilateral: Secondary | ICD-10-CM | POA: Diagnosis not present

## 2022-09-07 DIAGNOSIS — H25813 Combined forms of age-related cataract, bilateral: Secondary | ICD-10-CM | POA: Diagnosis not present

## 2022-09-07 DIAGNOSIS — E119 Type 2 diabetes mellitus without complications: Secondary | ICD-10-CM | POA: Diagnosis not present

## 2022-09-07 DIAGNOSIS — H35033 Hypertensive retinopathy, bilateral: Secondary | ICD-10-CM | POA: Diagnosis not present

## 2022-09-07 LAB — HM DIABETES EYE EXAM

## 2022-09-08 ENCOUNTER — Other Ambulatory Visit: Payer: Self-pay | Admitting: Family

## 2022-09-08 DIAGNOSIS — F32A Depression, unspecified: Secondary | ICD-10-CM

## 2022-09-12 ENCOUNTER — Ambulatory Visit: Payer: 59 | Attending: Internal Medicine

## 2022-09-12 DIAGNOSIS — J9611 Chronic respiratory failure with hypoxia: Secondary | ICD-10-CM

## 2022-09-12 NOTE — Progress Notes (Signed)
Patient in office today for a Walk test. Patient did not come to the office with her oxygen.   Room air resting pulse oximetry 98%  Room air pulse oximetry 96% after 6 min's of exertion.  Walk test terminated . Patient does not met requires to continue.

## 2022-09-24 ENCOUNTER — Other Ambulatory Visit: Payer: Self-pay | Admitting: Internal Medicine

## 2022-09-24 DIAGNOSIS — J449 Chronic obstructive pulmonary disease, unspecified: Secondary | ICD-10-CM

## 2022-09-24 NOTE — Telephone Encounter (Signed)
Medication Refill - Medication: TRELEGY ELLIPTA 100-62.5-25 MCG/ACT AEPB   Has the patient contacted their pharmacy? No.  Preferred Pharmacy (with phone number or street name):  [facility] - [place], [place] - [address] Phone: [phone]  Fax: [phone]     Has the patient been seen for an appointment in the last year OR does the patient have an upcoming appointment? Yes.    Agent: Please be advised that RX refills may take up to 3 business days. We ask that you follow-up with your pharmacy.

## 2022-09-25 NOTE — Telephone Encounter (Signed)
Requested medication (s) are due for refill today: Yes  Requested medication (s) are on the active medication list: Yes  Last refill:  03/13/21  Future visit scheduled: Yes  Notes to clinic:  Manual review.    Requested Prescriptions  Pending Prescriptions Disp Refills   Fluticasone-Umeclidin-Vilant (TRELEGY ELLIPTA) 100-62.5-25 MCG/ACT AEPB 180 each 3     Off-Protocol Failed - 09/24/2022 12:15 PM      Failed - Medication not assigned to a protocol, review manually.      Passed - Valid encounter within last 12 months    Recent Outpatient Visits           3 months ago Encounter to establish care   [facility] Marcine Matar, MD   1 year ago Essential hypertension   [place] [facility] Grayce Sessions, NP       Future Appointments             In 3 weeks Marcine Matar, MD [facility]

## 2022-09-27 NOTE — Telephone Encounter (Signed)
Will forward to correct nurse  

## 2022-10-01 ENCOUNTER — Other Ambulatory Visit: Payer: Self-pay | Admitting: Internal Medicine

## 2022-10-01 DIAGNOSIS — J449 Chronic obstructive pulmonary disease, unspecified: Secondary | ICD-10-CM

## 2022-10-01 NOTE — Telephone Encounter (Signed)
Medication Refill - Medication: TRELEGY ELLIPTA 100-62.5-25 MCG/ACT AEPB   Pt has been out of medication for a few days and is wanting to know if this will affect her not having her medication. Pt also states that she has not gotten her 14 day supply of her sensors.    Has the patient contacted their pharmacy? No.  Preferred Pharmacy (with phone number or street name):  [facility] - [place], [place] - [address]  Phone: [phone] Fax: [phone]  Has the patient been seen for an appointment in the last year OR does the patient have an upcoming appointment? Yes.    Agent: Please be advised that RX refills may take up to 3 business days. We ask that you follow-up with your pharmacy.

## 2022-10-02 ENCOUNTER — Telehealth: Payer: Self-pay | Admitting: Internal Medicine

## 2022-10-02 MED ORDER — TRELEGY ELLIPTA 100-62.5-25 MCG/ACT IN AEPB
INHALATION_SPRAY | RESPIRATORY_TRACT | 1 refills | Status: DC
Start: 2022-10-02 — End: 2022-11-15

## 2022-10-02 NOTE — Telephone Encounter (Signed)
Requested medication (s) are due for refill today:   Yes  Requested medication (s) are on the active medication list:   Yes  Future visit scheduled:   Yes 10/7 with [facility]  but was with Gwinda Passe, NP   Last ordered: 09/24/2022 180 each, 3 refills  Returned because no protocol assigned to this medication.   Got a duplicate request.   Wasn't sure who would refill this, Gwinda Passe or the provider from [facility].      Requested Prescriptions  Pending Prescriptions Disp Refills   Fluticasone-Umeclidin-Vilant (TRELEGY ELLIPTA) 100-62.5-25 MCG/ACT AEPB 180 each 3     Off-Protocol Failed - 10/01/2022  1:25 PM      Failed - Medication not assigned to a protocol, review manually.      Passed - Valid encounter within last 12 months    Recent Outpatient Visits           3 months ago Encounter to establish care   [facility] Marcine Matar, MD   2 years ago Essential hypertension   [place] [facility] Grayce Sessions, NP       Future Appointments             In 2 weeks Marcine Matar, MD [facility]

## 2022-10-02 NOTE — Telephone Encounter (Signed)
Pt is calling in to check on the status of her refill for TRELEGY ELLIPTA . Pt says she has been trying to get this medication filled and hasn't heard anything back. Pt would like to know when she can get this medication. Please advise.

## 2022-10-02 NOTE — Telephone Encounter (Signed)
Rxn sent

## 2022-10-03 ENCOUNTER — Other Ambulatory Visit: Payer: Self-pay | Admitting: Internal Medicine

## 2022-10-03 ENCOUNTER — Other Ambulatory Visit: Payer: Self-pay | Admitting: Nurse Practitioner

## 2022-10-03 DIAGNOSIS — I10 Essential (primary) hypertension: Secondary | ICD-10-CM

## 2022-10-03 MED ORDER — TRELEGY ELLIPTA 100-62.5-25 MCG/ACT IN AEPB
INHALATION_SPRAY | RESPIRATORY_TRACT | 3 refills | Status: DC
Start: 2022-10-03 — End: 2022-11-15

## 2022-10-03 NOTE — Telephone Encounter (Signed)
Called & spoke to the patient. Informed that refill has been sent to her pharmacy and should be delivered soon. Patient expressed verbal understanding. No further questions at this time.

## 2022-10-03 NOTE — Telephone Encounter (Unsigned)
Copied from CRM [id number]. Topic: General - Other >> Oct 03, 2022  5:16 PM Everette C wrote: Reason for CRM: Medication Refill - Medication: Continuous Blood Gluc Sensor (FREESTYLE LIBRE 14 DAY SENSOR) MISC [[id number]]  Has the patient contacted their pharmacy? Yes.   (Agent: If no, request that the patient contact the pharmacy for the refill. If patient does not wish to contact the pharmacy document the reason why and proceed with request.) (Agent: If yes, when and what did the pharmacy advise?)  Preferred Pharmacy (with phone number or street name): [facility] - [place], [place] - [address] [facility] [address] [place] [address] Phone: [phone] Fax: [phone] Hours: Not open 24 hours   Has the patient been seen for an appointment in the last year OR does the patient have an upcoming appointment? Yes.    Agent: Please be advised that RX refills may take up to 3 business days. We ask that you follow-up with your pharmacy.

## 2022-10-04 MED ORDER — FREESTYLE LIBRE 14 DAY SENSOR MISC: 12 refills | Status: DC

## 2022-10-04 NOTE — Telephone Encounter (Signed)
Requested medication (s) are due for refill today - yes  Requested medication (s) are on the active medication list -yes  Future visit scheduled -yes  Last refill: 04/06/21 2 each 12RF  Notes to clinic: outside provider- sent for review   Requested Prescriptions  Pending Prescriptions Disp Refills   Continuous Glucose Sensor (FREESTYLE LIBRE 14 DAY SENSOR) MISC 2 each 12    Sig: Check blood glucose 3 times daily before meals and as needed     Endocrinology: Diabetes - Testing Supplies Passed - 10/03/2022  5:56 PM      Passed - Valid encounter within last 12 months    Recent Outpatient Visits           3 months ago Encounter to establish care   [facility] Marcine Matar, MD   2 years ago Essential hypertension   [place] [facility] Grayce Sessions, NP       Future Appointments             In 2 weeks Marcine Matar, MD [facility]               Requested Prescriptions  Pending Prescriptions Disp Refills   Continuous Glucose Sensor (FREESTYLE LIBRE 14 DAY SENSOR) MISC 2 each 12    Sig: Check blood glucose 3 times daily before meals and as needed     Endocrinology: Diabetes - Testing Supplies Passed - 10/03/2022  5:56 PM      Passed - Valid encounter within last 12 months    Recent Outpatient Visits           3 months ago Encounter to establish care   [facility] Marcine Matar, MD   2 years ago Essential hypertension   [place] [facility] Grayce Sessions, NP       Future Appointments             In 2 weeks Marcine Matar, MD [facility]

## 2022-10-07 ENCOUNTER — Encounter: Payer: Self-pay | Admitting: Internal Medicine

## 2022-10-07 NOTE — Progress Notes (Signed)
Received request from a company known as [facility] requesting documentation supporting the need for insulin pump/CGM and/or supplies.  The prescription is for continuous glucose monitor.  One of the requirements stated in the letter is that the beneficiary is insulin treated with multiple daily injections of insulin or pump.  Patient does not meet either of these criteria.  She is on metformin.  Form was not completed.

## 2022-10-22 ENCOUNTER — Ambulatory Visit: Payer: Self-pay | Admitting: Internal Medicine

## 2022-10-23 ENCOUNTER — Telehealth: Payer: Self-pay

## 2022-10-23 NOTE — Telephone Encounter (Signed)
Copied from CRM [id number]. Topic: General - Other >> Oct 23, 2022  1:13 PM Turkey B wrote: Reason for CRM: pt called in states, the FREESTYLE LIBRE 14 DAY SENSOR) MISC. She wants to know if she still needs this. Please cb

## 2022-10-24 ENCOUNTER — Other Ambulatory Visit: Payer: Self-pay | Admitting: Internal Medicine

## 2022-10-24 ENCOUNTER — Telehealth: Payer: Self-pay | Admitting: Internal Medicine

## 2022-10-24 DIAGNOSIS — K5900 Constipation, unspecified: Secondary | ICD-10-CM

## 2022-10-24 NOTE — Telephone Encounter (Signed)
Patient called in regards to the Glucose sensors. Advised of the message below to call [facility] and place the order as noted in the below message. She verbalized understanding and says she's called and now they asking her to call back, ok, I'll call she says.

## 2022-10-24 NOTE — Telephone Encounter (Signed)
Looks like prescription was submitted on 10/04/2022 for the [facility] sensor for continuous glucose monitoring.  We had received a request for the continuous glucose monitor from a company called [facility].  Did she request the CGM and did she receive the continuous glucose monitor?

## 2022-10-24 NOTE — Telephone Encounter (Signed)
Rxn request forwarded to Dr. Laural Benes. I cannot approve this per protocol as it was prescribed under a different provider.

## 2022-10-24 NOTE — Telephone Encounter (Addendum)
Patient is requesting linzess, saying she's out of this medication and was told it would be sent to the pharmacy today. Advised it will be have to be sent to Dr. Laural Benes for approval and it may not be looked at until 24-48 hours. She says things are not right over here at the office that she may need to call the [employer] about this.    Requested medication (s) are due for refill today: Yes  Requested medication (s) are on the active medication list: Yes  Last refill:  05/22/21  Future visit scheduled: Yes  Notes to clinic:  Unable to refill per protocol, last refill by another provider. Patient says she's out of medication and needs it refilled.     Requested Prescriptions  Pending Prescriptions Disp Refills   linaclotide (LINZESS) 145 MCG CAPS capsule 90 capsule 3    Sig: Take 1 capsule (145 mcg total) by mouth daily before breakfast.     Gastroenterology: Irritable Bowel Syndrome Passed - 10/24/2022  5:15 PM      Passed - Valid encounter within last 12 months    Recent Outpatient Visits           4 months ago Encounter to establish care   [facility] Marcine Matar, MD   2 years ago Essential hypertension   [place] [facility] Grayce Sessions, NP       Future Appointments             In 2 months Marcine Matar, MD [facility]

## 2022-10-24 NOTE — Telephone Encounter (Signed)
Spoke with patient. Patient voiced that she has not received her CGM.  Patient voiced that was told that her PCP did not approve order. Call placed to [facility] Spoke with Mylyn. Advised that order for sensors were placed on 10/04/2022 and order is seen in the patient chart.Advised the PCP has also acknowledged she sent order on 10/04/2022.  When Mylyn  did found the order she told me we had to resend because it was expired.  Advised that the  order should not be expired it was just sent on 09-19 Mylyn placed me on hold . When she returned she said it was ok just have the patient call to order . She would not let me place the order for the patient.  Mylyn was very confusing and really seemed uncertain as to what she was doing. Contacted our pharmacist for advisement . Pharmacist advised that the patient my need to enroll in [facility] program on their website so that the sensors can be sent to the patient.  Call placed to advise patient unable to reach ,message to return call left on VM.

## 2022-10-24 NOTE — Telephone Encounter (Signed)
Pt is calling in regarding her CGM sensors. Pt says she is dissatisfied with the way this situation has been handled and just wants to know when she can get her sensors as well as her Linzess medication. Pt said she spoke with a nurse earlier who was supposed to call her back but she hasn't heard anything. Pt says she's already signed up for the program online regarding her sensors and says she should have had them by now. Please follow up with pt

## 2022-10-24 NOTE — Telephone Encounter (Signed)
Pt is calling in returning a call from the office. Please follow up with pt.

## 2022-10-25 ENCOUNTER — Telehealth: Payer: Self-pay | Admitting: Internal Medicine

## 2022-10-25 NOTE — Telephone Encounter (Signed)
error 

## 2022-10-27 MED ORDER — LINACLOTIDE 145 MCG PO CAPS
145.0000 ug | ORAL_CAPSULE | Freq: Every day | ORAL | 1 refills | Status: DC
Start: 2022-10-27 — End: 2022-11-07

## 2022-10-30 ENCOUNTER — Other Ambulatory Visit: Payer: Self-pay | Admitting: Internal Medicine

## 2022-10-30 MED ORDER — FREESTYLE LIBRE 14 DAY SENSOR MISC: 12 refills | Status: DC

## 2022-10-30 NOTE — Telephone Encounter (Signed)
Prescription sent to [facility] for the sensor.

## 2022-10-31 NOTE — Telephone Encounter (Signed)
Called & spoke to the patient. Verified name & DOB. Informed that prescription for the sensors has been sent to [facility]. Patient expressed verbal understanding is very thankful. No further assistance needed at this time.

## 2022-11-04 ENCOUNTER — Other Ambulatory Visit: Payer: Self-pay | Admitting: Family

## 2022-11-05 ENCOUNTER — Telehealth: Payer: Self-pay

## 2022-11-05 ENCOUNTER — Other Ambulatory Visit: Payer: Self-pay | Admitting: Pharmacist

## 2022-11-05 DIAGNOSIS — E1169 Type 2 diabetes mellitus with other specified complication: Secondary | ICD-10-CM

## 2022-11-05 MED ORDER — FREESTYLE LIBRE 2 SENSOR MISC
6 refills | Status: DC
Start: 2022-11-05 — End: 2022-12-17

## 2022-11-05 MED ORDER — FREESTYLE LIBRE 2 READER DEVI
0 refills | Status: DC
Start: 2022-11-05 — End: 2022-12-17

## 2022-11-05 NOTE — Telephone Encounter (Signed)
Glennon Hamilton calling from [facility] is calling to report that Continuous Glucose Sensor (FREESTYLE LIBRE 14 DAY SENSOR) MISC [[id number]] is invalid. It is not matching with the code.  Requesting script to be resent.  Medication Refill - Medication: Continuous Glucose Sensor (FREESTYLE LIBRE 14 DAY SENSOR) MISC [[id number]]   Has the patient contacted their pharmacy? Yes.   (Agent: If no, request that the patient contact the pharmacy for the refill. If patient does not wish to contact the pharmacy document the reason why and proceed with request.) (Agent: If yes, when and what did the pharmacy advise?)  Preferred Pharmacy (with phone number or street name):   [facility] - [place], [place] - [address] [facility] [address] [place] [address] Phone: [phone]  Fax: [phone]    Has the patient been seen for an appointment in the last year OR does the patient have an upcoming appointment? Yes.    Agent: Please be advised that RX refills may take up to 3 business days. We ask that you follow-up with your pharmacy.

## 2022-11-05 NOTE — Telephone Encounter (Signed)
Noted  

## 2022-11-05 NOTE — Telephone Encounter (Signed)
Copied from CRM [id number]. Topic: General - Other >> Nov 02, 2022  4:19 PM Phill Myron wrote: A track note number is needed for for 14 day libre sensors  fax [phone]

## 2022-11-05 NOTE — Telephone Encounter (Signed)
Yes ma'am, rxn sent. 

## 2022-11-07 ENCOUNTER — Other Ambulatory Visit: Payer: Self-pay | Admitting: Internal Medicine

## 2022-11-07 DIAGNOSIS — K5900 Constipation, unspecified: Secondary | ICD-10-CM

## 2022-11-07 MED ORDER — LINACLOTIDE 145 MCG PO CAPS
145.0000 ug | ORAL_CAPSULE | Freq: Every day | ORAL | 1 refills | Status: DC
Start: 2022-11-07 — End: 2022-11-14

## 2022-11-12 DIAGNOSIS — E119 Type 2 diabetes mellitus without complications: Secondary | ICD-10-CM | POA: Diagnosis not present

## 2022-11-14 ENCOUNTER — Telehealth: Payer: Self-pay

## 2022-11-14 ENCOUNTER — Other Ambulatory Visit: Payer: Self-pay | Admitting: Internal Medicine

## 2022-11-14 DIAGNOSIS — K5900 Constipation, unspecified: Secondary | ICD-10-CM

## 2022-11-14 MED ORDER — LINACLOTIDE 145 MCG PO CAPS
145.0000 ug | ORAL_CAPSULE | Freq: Every day | ORAL | 1 refills | Status: DC
Start: 2022-11-14 — End: 2022-11-15

## 2022-11-14 NOTE — Telephone Encounter (Unsigned)
Copied from CRM [id number]. Topic: General - Other >> Nov 09, 2022  3:19 PM Turkey B wrote: Reason for CRM: melody from [facility] care, says faxed over request for most recent office notes, and hasn't received. Please send this, fx is [phone]

## 2022-11-15 ENCOUNTER — Ambulatory Visit: Payer: 59 | Attending: Physician Assistant | Admitting: Physician Assistant

## 2022-11-15 ENCOUNTER — Encounter: Payer: Self-pay | Admitting: Physician Assistant

## 2022-11-15 VITALS — BP 102/67 | HR 74 | Wt 207.0 lb

## 2022-11-15 DIAGNOSIS — F32A Depression, unspecified: Secondary | ICD-10-CM

## 2022-11-15 DIAGNOSIS — R0602 Shortness of breath: Secondary | ICD-10-CM

## 2022-11-15 DIAGNOSIS — Z7985 Long-term (current) use of injectable non-insulin antidiabetic drugs: Secondary | ICD-10-CM

## 2022-11-15 DIAGNOSIS — Z7984 Long term (current) use of oral hypoglycemic drugs: Secondary | ICD-10-CM | POA: Diagnosis not present

## 2022-11-15 DIAGNOSIS — E1165 Type 2 diabetes mellitus with hyperglycemia: Secondary | ICD-10-CM

## 2022-11-15 DIAGNOSIS — I10 Essential (primary) hypertension: Secondary | ICD-10-CM | POA: Diagnosis not present

## 2022-11-15 DIAGNOSIS — Z23 Encounter for immunization: Secondary | ICD-10-CM

## 2022-11-15 DIAGNOSIS — K5900 Constipation, unspecified: Secondary | ICD-10-CM

## 2022-11-15 DIAGNOSIS — J449 Chronic obstructive pulmonary disease, unspecified: Secondary | ICD-10-CM | POA: Diagnosis not present

## 2022-11-15 LAB — POCT GLYCOSYLATED HEMOGLOBIN (HGB A1C): HbA1c, POC (controlled diabetic range): 6.6 % (ref 0.0–7.0)

## 2022-11-15 LAB — GLUCOSE, POCT (MANUAL RESULT ENTRY): POC Glucose: 120 mg/dL — AB (ref 70–99)

## 2022-11-15 MED ORDER — MONTELUKAST SODIUM 10 MG PO TABS: 10.0000 mg | ORAL_TABLET | Freq: Every day | ORAL | 3 refills | Status: DC

## 2022-11-15 MED ORDER — CARVEDILOL 12.5 MG PO TABS: 12.5000 mg | ORAL_TABLET | Freq: Two times a day (BID) | ORAL | 3 refills | Status: DC

## 2022-11-15 MED ORDER — METFORMIN HCL ER 500 MG PO TB24: ORAL_TABLET | ORAL | 1 refills | Status: DC

## 2022-11-15 MED ORDER — TRAZODONE HCL 50 MG PO TABS: 50.0000 mg | ORAL_TABLET | Freq: Every evening | ORAL | 3 refills | Status: DC | PRN

## 2022-11-15 MED ORDER — ALBUTEROL SULFATE HFA 108 (90 BASE) MCG/ACT IN AERS: 1.0000 | INHALATION_SPRAY | Freq: Four times a day (QID) | RESPIRATORY_TRACT | 2 refills | Status: DC | PRN

## 2022-11-15 MED ORDER — LISINOPRIL-HYDROCHLOROTHIAZIDE 20-12.5 MG PO TABS: 1.0000 | ORAL_TABLET | Freq: Every day | ORAL | 1 refills | Status: DC

## 2022-11-15 MED ORDER — TRELEGY ELLIPTA 100-62.5-25 MCG/ACT IN AEPB: INHALATION_SPRAY | RESPIRATORY_TRACT | 1 refills | Status: DC

## 2022-11-15 MED ORDER — FLUOXETINE HCL 40 MG PO CAPS
40.0000 mg | ORAL_CAPSULE | Freq: Every day | ORAL | 1 refills | Status: DC
Start: 2022-11-15 — End: 2022-11-22

## 2022-11-15 MED ORDER — AMLODIPINE BESYLATE 5 MG PO TABS: 5.0000 mg | ORAL_TABLET | Freq: Every day | ORAL | 1 refills | Status: DC

## 2022-11-15 MED ORDER — SEMAGLUTIDE(0.25 OR 0.5MG/DOS) 2 MG/3ML ~~LOC~~ SOPN
0.2500 mg | PEN_INJECTOR | SUBCUTANEOUS | 0 refills | Status: DC
Start: 2022-11-15 — End: 2022-12-17

## 2022-11-15 MED ORDER — LINACLOTIDE 145 MCG PO CAPS: 145.0000 ug | ORAL_CAPSULE | Freq: Every day | ORAL | 1 refills | Status: DC

## 2022-11-15 MED ORDER — ESOMEPRAZOLE MAGNESIUM 40 MG PO CPDR: 40.0000 mg | DELAYED_RELEASE_CAPSULE | Freq: Every day | ORAL | 3 refills | Status: DC

## 2022-11-15 NOTE — Progress Notes (Signed)
[name], female   DOB: [birth year], 67 y.o.   MRN: [id number]   [name], is a 67 y.o. female  [id number]  [id number]  DOB - 1955/12/29  No chief complaint on file.      Subjective:   [name] is a 67 y.o. female here today for med RF and wants something to help with sugar cravings and weight loss.  She does check her blood sugars a few times a week and they range from 150-200.  No episodes of hypoglycemia.  She would like to get a flu shot today.  Depression and anxiety stable on meds.  Not open to counseling.  Denies SI/HI No problems updated.  ALLERGIES: Allergies  Allergen Reactions   Flonase [Fluticasone Propionate]     Nose bleeds    PAST MEDICAL HISTORY: Past Medical History:  Diagnosis Date   Anxiety    Asthma    Chest pain    COPD (chronic obstructive pulmonary disease) (HCC)    Depression    DM type 2 (diabetes mellitus, type 2) (HCC)    Hypertension    Pneumonia    Shortness of breath dyspnea     MEDICATIONS AT HOME: Prior to Admission medications   Medication Sig Start Date End Date Taking? Authorizing Provider  Semaglutide,0.25 or 0.5MG /DOS, 2 MG/3ML SOPN Inject 0.25 mg into the skin once a week. 11/15/22  Yes Maelin Kurkowski, Marzella Schlein, PA-C  albuterol (PROAIR HFA) 108 (90 Base) MCG/ACT inhaler Inhale 1-2 puffs into the lungs every 6 (six) hours as needed for wheezing or shortness of breath. 11/15/22   Anders Simmonds, PA-C  amLODipine (NORVASC) 5 MG tablet Take 1 tablet (5 mg total) by mouth daily. 11/15/22   Anders Simmonds, PA-C  aspirin 81 MG tablet Take 81 mg by mouth daily.    [provider]  atorvastatin (LIPITOR) 80 MG tablet TAKE 1 TABLET BY MOUTH DAILY 05/29/22   Ngetich, Dinah C, NP  Camphor-Eucalyptus-Menthol (VICKS VAPORUB EX) Apply 1 application topically 2 (two) times daily as needed (breathe and soreness).    [provider]  carvedilol (COREG) 12.5 MG tablet Take 1 tablet (12.5 mg total) by mouth  2 (two) times daily with a meal. 11/15/22   Retal Tonkinson, Marzella Schlein, PA-C  Continuous Glucose Receiver (FREESTYLE LIBRE 2 READER) DEVI Use to check glucose continuously throughout the day. E11.69 11/05/22   Marcine Matar, MD  Continuous Glucose Sensor (FREESTYLE LIBRE 2 SENSOR) MISC Place 1 sensor on the skin every 14 days. Use to check glucose continuously. E11.69 11/05/22   Marcine Matar, MD  esomeprazole (NEXIUM) 40 MG capsule Take 1 capsule (40 mg total) by mouth daily. 11/15/22   Anders Simmonds, PA-C  FLUoxetine (PROZAC) 40 MG capsule Take 1 capsule (40 mg total) by mouth daily. 11/15/22   Anders Simmonds, PA-C  Fluticasone-Umeclidin-Vilant (TRELEGY ELLIPTA) 100-62.5-25 MCG/ACT AEPB USE 1 INHALATION BY MOUTH  ONCE DAILY AT THE SAME TIME EACH DAY 11/15/22   Anders Simmonds, PA-C  linaclotide [facility]) 145 MCG CAPS capsule Take 1 capsule (145 mcg total) by mouth daily before breakfast. 11/15/22   Anders Simmonds, PA-C  lisinopril-hydrochlorothiazide (ZESTORETIC) 20-12.5 MG tablet Take 1 tablet by mouth daily. 11/15/22   Anders Simmonds, PA-C  metFORMIN (GLUCOPHAGE-XR) 500 MG 24 hr tablet TAKE 1 TABLET BY MOUTH TWICE  DAILY WITH MEALS 11/15/22   Adelard Sanon M, PA-C  montelukast (SINGULAIR) 10 MG tablet Take 1 tablet (10 mg total) by  mouth at bedtime. 11/15/22   Anders Simmonds, PA-C  Naproxen Sodium (ALEVE PO) Take 1 tablet by mouth at bedtime as needed.    [provider]  nitroGLYCERIN (NITROSTAT) 0.3 MG SL tablet DISSOLVE 1 TABLET UNDER THE  TONGUE EVERY 5 MINUTES AS NEEDED FOR CHEST PAIN. MAX OF 3 TABLETS IN 15 MINUTES. CALL 911 IF PAIN  PERSISTS. 04/02/22   Ngetich, Dinah C, NP  tiZANidine (ZANAFLEX) 2 MG tablet Take 2 mg by mouth every 6 (six) hours as needed. 12/01/19   [provider]  traZODone (DESYREL) 50 MG tablet Take 1 tablet (50 mg total) by mouth at bedtime as needed. 11/15/22   Eniya Cannady, Marzella Schlein, PA-C    ROS: Neg HEENT Neg resp Neg  cardiac Neg GI Neg GU Neg MS Neg psych Neg neuro  Objective:   Vitals:   11/15/22 1548  BP: 102/67  Pulse: 74  SpO2: 92%  Weight: 207 lb (93.9 kg)   Exam General appearance : Awake, alert, not in any distress. Speech Clear. Not toxic looking.  Uses a cane HEENT: Atraumatic and Normocephalic Neck: Supple, no JVD. No cervical lymphadenopathy.  Chest: Good air entry bilaterally, CTAB.  No rales/rhonchi/wheezing CVS: S1 S2 regular, no murmurs.  Extremities: B/L Lower Ext shows no edema, both legs are warm to touch Neurology: Awake alert, and oriented X 3, CN II-XII intact, Non focal Skin: No Rash  Data Review Lab Results  Component Value Date   HGBA1C 6.4 06/22/2022   HGBA1C 6.8 (H) 01/29/2022   HGBA1C 6.4 (H) 08/03/2021      11/15/2022    4:02 PM 06/22/2022    9:55 AM 05/03/2022    3:32 PM  Depression screen PHQ 2/9  Decreased Interest 1 2 1   Down, Depressed, Hopeless 1 2 1   PHQ - 2 Score 2 4 2   Altered sleeping 3 3 2   Tired, decreased energy  3 2  Change in appetite  2 2  Feeling bad or failure about yourself   2 1  Trouble concentrating  2   Moving slowly or fidgety/restless  1 0  Suicidal thoughts  2 0  PHQ-9 Score 5 19 9   Difficult doing work/chores   Not difficult at all    Assessment & Plan   1. Type 2 diabetes mellitus with hyperglycemia, without long-term current use of insulin (HCC) A1C=6.6 today - Glucose (CBG) - HgB A1c - Semaglutide,0.25 or 0.5MG /DOS, 2 MG/3ML SOPN; Inject 0.25 mg into the skin once a week.  Dispense: 3 mL; Refill: 0 - metFORMIN (GLUCOPHAGE-XR) 500 MG 24 hr tablet; TAKE 1 TABLET BY MOUTH TWICE  DAILY WITH MEALS  Dispense: 160 tablet; Refill: 1 - Comprehensive metabolic panel   2. Essential hypertension controlled - lisinopril-hydrochlorothiazide (ZESTORETIC) 20-12.5 MG tablet; Take 1 tablet by mouth daily.  Dispense: 90 tablet; Refill: 1 - carvedilol (COREG) 12.5 MG tablet; Take 1 tablet (12.5 mg total) by mouth 2 (two) times  daily with a meal.  Dispense: 180 tablet; Refill: 3 - Comprehensive metabolic panel  4. Constipation, unspecified constipation type - linaclotide (LINZESS) 145 MCG CAPS capsule; Take 1 capsule (145 mcg total) by mouth daily before breakfast.  Dispense: 100 capsule; Refill: 1  5. COPD GOLD 0  - montelukast (SINGULAIR) 10 MG tablet; Take 1 tablet (10 mg total) by mouth at bedtime.  Dispense: 90 tablet; Refill: 3 - Fluticasone-Umeclidin-Vilant (TRELEGY ELLIPTA) 100-62.5-25 MCG/ACT AEPB; USE 1 INHALATION BY MOUTH  ONCE DAILY AT THE SAME TIME EACH DAY  Dispense: 180 each; Refill: 1 - albuterol (PROAIR HFA) 108 (90 Base) MCG/ACT inhaler; Inhale 1-2 puffs into the lungs every 6 (six) hours as needed for wheezing or shortness of breath.  Dispense: 18 g; Refill: 2  6. Depression, unspecified depression type stable - FLUoxetine (PROZAC) 40 MG capsule; Take 1 capsule (40 mg total) by mouth daily.  Dispense: 90 capsule; Refill: 1 - traZODone (DESYREL) 50 MG tablet; Take 1 tablet (50 mg total) by mouth at bedtime as needed.  Dispense: 90 tablet; Refill: 3     Return in about 4 weeks (around 12/13/2022) for Luke-diabetes check and increase ozempic if helping; PCP in 4 months.  The patient was given clear instructions to go to ER or return to medical center if symptoms don't improve, worsen or new problems develop. The patient verbalized understanding. The patient was told to call to get lab results if they haven't heard anything in the next week.      Georgian Co, PA-C [facility] [place], [place] [phone]   11/15/2022, 4:21 PM

## 2022-11-15 NOTE — Patient Instructions (Addendum)
Check your blood sugars fasting daily.  Drink plenty of water with starting a new medication.

## 2022-11-16 ENCOUNTER — Telehealth: Payer: Self-pay

## 2022-11-16 ENCOUNTER — Other Ambulatory Visit: Payer: Self-pay | Admitting: Family

## 2022-11-16 LAB — COMPREHENSIVE METABOLIC PANEL
ALT: 22 [IU]/L (ref 0–32)
AST: 17 [IU]/L (ref 0–40)
Albumin: 4 g/dL (ref 3.9–4.9)
Alkaline Phosphatase: 61 [IU]/L (ref 44–121)
BUN/Creatinine Ratio: 15 (ref 12–28)
BUN: 11 mg/dL (ref 8–27)
Bilirubin Total: 0.4 mg/dL (ref 0.0–1.2)
CO2: 21 mmol/L (ref 20–29)
Calcium: 10.9 mg/dL — ABNORMAL HIGH (ref 8.7–10.3)
Chloride: 104 mmol/L (ref 96–106)
Creatinine, Ser: 0.72 mg/dL (ref 0.57–1.00)
Globulin, Total: 2.7 g/dL (ref 1.5–4.5)
Glucose: 115 mg/dL — ABNORMAL HIGH (ref 70–99)
Potassium: 4 mmol/L (ref 3.5–5.2)
Sodium: 139 mmol/L (ref 134–144)
Total Protein: 6.7 g/dL (ref 6.0–8.5)
eGFR: 92 mL/min/{1.73_m2} (ref >59)

## 2022-11-16 NOTE — Telephone Encounter (Signed)
Pt was called and is aware of results, DOB was confirmed.  ?

## 2022-11-16 NOTE — Telephone Encounter (Signed)
-----   Message from Georgian Co sent at 11/16/2022 11:11 AM EDT ----- Kidney and liver function as well as electrolytes are normal.  Calcium is a little elevated but lower than before.  We will monitor this.  Thanks, Georgian Co, PA_C

## 2022-11-21 ENCOUNTER — Other Ambulatory Visit: Payer: Self-pay | Admitting: Internal Medicine

## 2022-11-21 DIAGNOSIS — F32A Depression, unspecified: Secondary | ICD-10-CM

## 2022-11-21 NOTE — Telephone Encounter (Signed)
Medication Refill -  Most Recent Primary Care Visit:  Provider: Anders Simmonds  Department: [facility]  Visit Type: OFFICE VISIT  Date: 11/15/2022  Medication:  FLUoxetine (PROZAC) 40 MG capsule  *a week supply left  *Patient saw Georgian Co on 11/15/2022 and she was supposed to send in all of her medication to [facility] DELIVERY but patient needs a refill of medication above sent to pharmacy below until she receives her medication from [facility]  Has the patient contacted their pharmacy? No.  Is this the correct pharmacy for this prescription? Yes. If no, delete pharmacy and type the correct one.  This is the patient's preferred pharmacy: [facility] [address] [place], [place] - [address] [place] [address] Phone: [phone] Fax: [phone]  Has the prescription been filled recently? Sent to [facility] in October but needs an emergency refill until she can receive her medication from [facility]  Is the patient out of the medication? No, she has about a week left. But does not want to run out  Has the patient been seen for an appointment in the last year OR does the patient have an upcoming appointment? Yes. Last seen Georgian Co on 11/14/2022

## 2022-11-22 MED ORDER — FLUOXETINE HCL 40 MG PO CAPS
40.0000 mg | ORAL_CAPSULE | Freq: Every day | ORAL | 0 refills | Status: DC
Start: 2022-11-22 — End: 2022-12-17

## 2022-11-22 NOTE — Telephone Encounter (Signed)
Requested medication (s) are due for refill today: short supply requested  Requested medication (s) are on the active medication list: yes  Last refill:  11/15/22  Future visit scheduled: yes  Notes to clinic:  Patient request short supply sent to [facility] [address] - [place], [place] - [address] until medication arrives from mail order pharmacy.     Requested Prescriptions  Pending Prescriptions Disp Refills   FLUoxetine (PROZAC) 40 MG capsule 90 capsule 1    Sig: Take 1 capsule (40 mg total) by mouth daily.     Psychiatry:  Antidepressants - SSRI Passed - 11/22/2022  9:48 AM      Passed - Completed PHQ-2 or PHQ-9 in the last 360 days      Passed - Valid encounter within last 6 months    Recent Outpatient Visits           1 week ago Type 2 diabetes mellitus with hyperglycemia, without long-term current use of insulin (HCC)   [facility] [place]. [facility], Marzella Schlein, [place]   5 months ago Encounter to establish care   [facility] [place]. [facility] Marcine Matar, MD   2 years ago Essential hypertension   [facility] Grayce Sessions, NP       Future Appointments             In 3 weeks Lois Huxley, Cornelius Moras, RPH-CPP [facility] [place]. [facility]   In 1 month Laural Benes, Binnie Rail, MD [facility] [place] - [facility] Of [place] Bridegroom. [facility]   In 3 months Laural Benes, Binnie Rail, MD [facility] [place] - [facility] Of [place]ideg[place]. [facility]

## 2022-11-23 NOTE — Telephone Encounter (Signed)
Please find out why they need the last office note.  If this is to allow her to get diabetic testing supply from them, then we can go ahead and fax the last note when she saw Marylene Land.

## 2022-11-28 NOTE — Telephone Encounter (Signed)
No phone number notated to contact [facility] to verify information.

## 2022-12-17 ENCOUNTER — Other Ambulatory Visit (HOSPITAL_COMMUNITY): Payer: Self-pay

## 2022-12-17 ENCOUNTER — Ambulatory Visit: Payer: 59 | Attending: Internal Medicine | Admitting: Pharmacist

## 2022-12-17 DIAGNOSIS — Z7985 Long-term (current) use of injectable non-insulin antidiabetic drugs: Secondary | ICD-10-CM

## 2022-12-17 DIAGNOSIS — K5900 Constipation, unspecified: Secondary | ICD-10-CM

## 2022-12-17 DIAGNOSIS — F32A Depression, unspecified: Secondary | ICD-10-CM

## 2022-12-17 DIAGNOSIS — I10 Essential (primary) hypertension: Secondary | ICD-10-CM

## 2022-12-17 DIAGNOSIS — Z7984 Long term (current) use of oral hypoglycemic drugs: Secondary | ICD-10-CM

## 2022-12-17 DIAGNOSIS — E1169 Type 2 diabetes mellitus with other specified complication: Secondary | ICD-10-CM

## 2022-12-17 DIAGNOSIS — E1165 Type 2 diabetes mellitus with hyperglycemia: Secondary | ICD-10-CM

## 2022-12-17 DIAGNOSIS — J449 Chronic obstructive pulmonary disease, unspecified: Secondary | ICD-10-CM

## 2022-12-17 MED ORDER — CARVEDILOL 12.5 MG PO TABS
12.5000 mg | ORAL_TABLET | Freq: Two times a day (BID) | ORAL | 3 refills | Status: DC
  Filled 2022-12-17: qty 180, 90d supply, fill #0

## 2022-12-17 MED ORDER — ESOMEPRAZOLE MAGNESIUM 40 MG PO CPDR
40.0000 mg | DELAYED_RELEASE_CAPSULE | Freq: Every day | ORAL | 3 refills | Status: DC
  Filled 2022-12-17: qty 90, 90d supply, fill #0

## 2022-12-17 MED ORDER — MONTELUKAST SODIUM 10 MG PO TABS
10.0000 mg | ORAL_TABLET | Freq: Every day | ORAL | 3 refills | Status: DC
  Filled 2022-12-17: qty 90, 90d supply, fill #0

## 2022-12-17 MED ORDER — FREESTYLE LIBRE 2 SENSOR MISC
6 refills | Status: DC
  Filled 2022-12-17: qty 2, 28d supply, fill #0

## 2022-12-17 MED ORDER — ATORVASTATIN CALCIUM 80 MG PO TABS
80.0000 mg | ORAL_TABLET | Freq: Every day | ORAL | 1 refills | Status: DC
  Filled 2022-12-17: qty 90, 90d supply, fill #0

## 2022-12-17 MED ORDER — TRELEGY ELLIPTA 100-62.5-25 MCG/ACT IN AEPB
1.0000 | INHALATION_SPRAY | Freq: Every day | RESPIRATORY_TRACT | 1 refills | Status: DC
  Filled 2022-12-17 – 2023-02-19 (×2): qty 180, 90d supply, fill #0

## 2022-12-17 MED ORDER — TRAZODONE HCL 50 MG PO TABS
50.0000 mg | ORAL_TABLET | Freq: Every evening | ORAL | 3 refills | Status: DC | PRN
  Filled 2022-12-17: qty 90, 90d supply, fill #0

## 2022-12-17 MED ORDER — METFORMIN HCL ER 500 MG PO TB24
500.0000 mg | ORAL_TABLET | Freq: Two times a day (BID) | ORAL | 1 refills | Status: DC
  Filled 2022-12-17: qty 160, 80d supply, fill #0

## 2022-12-17 MED ORDER — LISINOPRIL-HYDROCHLOROTHIAZIDE 20-12.5 MG PO TABS
1.0000 | ORAL_TABLET | Freq: Every day | ORAL | 1 refills | Status: DC
  Filled 2022-12-17: qty 90, 90d supply, fill #0

## 2022-12-17 MED ORDER — SEMAGLUTIDE(0.25 OR 0.5MG/DOS) 2 MG/3ML ~~LOC~~ SOPN
0.2500 mg | PEN_INJECTOR | SUBCUTANEOUS | 0 refills | Status: DC
  Filled 2022-12-17: qty 3, 28d supply, fill #0

## 2022-12-17 MED ORDER — AMLODIPINE BESYLATE 5 MG PO TABS
5.0000 mg | ORAL_TABLET | Freq: Every day | ORAL | 1 refills | Status: DC
  Filled 2022-12-17: qty 90, 90d supply, fill #0

## 2022-12-17 MED ORDER — FREESTYLE LIBRE 2 READER DEVI
0 refills | Status: DC
  Filled 2022-12-17: qty 1, 90d supply, fill #0

## 2022-12-17 MED ORDER — FLUOXETINE HCL 40 MG PO CAPS
40.0000 mg | ORAL_CAPSULE | Freq: Every day | ORAL | 0 refills | Status: DC
  Filled 2022-12-17: qty 90, 90d supply, fill #0

## 2022-12-17 MED ORDER — ALBUTEROL SULFATE HFA 108 (90 BASE) MCG/ACT IN AERS
1.0000 | INHALATION_SPRAY | Freq: Four times a day (QID) | RESPIRATORY_TRACT | 2 refills | Status: AC | PRN
  Filled 2022-12-17: qty 18, 25d supply, fill #0

## 2022-12-17 MED ORDER — LINACLOTIDE 145 MCG PO CAPS
145.0000 ug | ORAL_CAPSULE | Freq: Every day | ORAL | 1 refills | Status: DC
  Filled 2022-12-17: qty 100, 100d supply, fill #0

## 2022-12-17 NOTE — Progress Notes (Signed)
Patient was educated on the use of the Ozempic pen. Reviewed necessary supplies and operation of the pen. Also reviewed goal blood glucose levels. Patient was able to demonstrate use. All questions and concerns were addressed. Patient gave her first Ozempic injection in clinic and tolerated well.  Written patient instructions provided. Patient verbalized understanding of treatment plan.  Total time in face to face counseling 25 minutes.    Follow-up:  Pharmacist as needed PCP clinic visit 12/31/22  Jarrett Ables, PharmD PGY-1 Pharmacy Resident

## 2022-12-24 ENCOUNTER — Other Ambulatory Visit: Payer: Self-pay

## 2022-12-29 ENCOUNTER — Other Ambulatory Visit (HOSPITAL_COMMUNITY): Payer: Self-pay

## 2022-12-31 ENCOUNTER — Ambulatory Visit: Payer: 59 | Attending: Internal Medicine | Admitting: Internal Medicine

## 2022-12-31 ENCOUNTER — Other Ambulatory Visit (HOSPITAL_COMMUNITY): Payer: Self-pay

## 2022-12-31 ENCOUNTER — Encounter: Payer: Self-pay | Admitting: Internal Medicine

## 2022-12-31 VITALS — BP 140/80 | HR 79 | Temp 98.3°F | Ht 67.0 in | Wt 210.0 lb

## 2022-12-31 DIAGNOSIS — Z7985 Long-term (current) use of injectable non-insulin antidiabetic drugs: Secondary | ICD-10-CM

## 2022-12-31 DIAGNOSIS — I152 Hypertension secondary to endocrine disorders: Secondary | ICD-10-CM

## 2022-12-31 DIAGNOSIS — E1159 Type 2 diabetes mellitus with other circulatory complications: Secondary | ICD-10-CM | POA: Diagnosis not present

## 2022-12-31 DIAGNOSIS — E785 Hyperlipidemia, unspecified: Secondary | ICD-10-CM

## 2022-12-31 DIAGNOSIS — E669 Obesity, unspecified: Secondary | ICD-10-CM

## 2022-12-31 DIAGNOSIS — Z7984 Long term (current) use of oral hypoglycemic drugs: Secondary | ICD-10-CM | POA: Diagnosis not present

## 2022-12-31 DIAGNOSIS — Z6832 Body mass index (BMI) 32.0-32.9, adult: Secondary | ICD-10-CM

## 2022-12-31 DIAGNOSIS — E1169 Type 2 diabetes mellitus with other specified complication: Secondary | ICD-10-CM | POA: Diagnosis not present

## 2022-12-31 DIAGNOSIS — J432 Centrilobular emphysema: Secondary | ICD-10-CM | POA: Diagnosis not present

## 2022-12-31 DIAGNOSIS — E66811 Obesity, class 1: Secondary | ICD-10-CM

## 2022-12-31 MED ORDER — AMLODIPINE BESYLATE 10 MG PO TABS
10.0000 mg | ORAL_TABLET | Freq: Every day | ORAL | 1 refills | Status: DC
  Filled 2022-12-31: qty 90, 90d supply, fill #0
  Filled 2023-01-01 – 2023-01-10 (×2): qty 100, 100d supply, fill #0

## 2022-12-31 NOTE — Progress Notes (Signed)
[name], female    DOB: 31-May-1955  MRN: [id number]  CC: Hypertension (HTN f/u. /Pt brought in denial for freestyle libre kit )   Subjective: [name] is a 67 y.o. female who presents for chronic ds management. Her concerns today include:  Patient with history of HTN, HL, COPD on O2, DM type II, anxiety/depression,   Discussed the use of AI scribe software for clinical note transcription with the patient, who gave verbal consent to proceed.  History of Present Illness   The patient, with a history of diabetes, hypertension, hyperlipidemia, and COPD, presents for a follow-up visit.   DM: Lab Results  Component Value Date   HGBA1C 6.6 11/15/2022  A1c was 6.6 end of Oct on visit with our PA. -currently on metformin 500mg  twice daily and prescribed Ozempic on visit in October to help control appetite as she often craves sweets. She has taken two doses of Ozempic so far and reports tolerating it well. She has not experienced any vomiting, abdominal pain, severe diarrhea, or constipation, which are potential side effects of Ozempic. She also takes Linzess for bowel regulation. -has CGM, but she may have to switch back to a manual glucometer due to insurance coverage issues. Forgot to bring reader with her today. She has not had any episodes of low blood sugar.  HTN:  She also has a blood pressure monitor at home but has not been checking her blood pressure regularly. Her blood pressure at this visit was 157/87, which is higher than the target of 130/80 or lower. -taking carvedilol 12.5mg  twice daily, amlodipine 5mg  daily, and lisinopril-hydrochlorothiazide 20/12.5 mg daily.  HL: She is also on atorvastatin 80mg  daily for hyperlipidemia.  Last LD 01/2022 was 94 with goal being less than 70.   COPD, she uses Trelegy and albuterol inhalers, the latter one to two times a day. No recent flares.  No longer on home O2.  Did walk test 09/12/2022 and pulse ox on room air resting  was 98% 96% after 6 minutes of walking.     Patient Active Problem List   Diagnosis Date Noted   Nondisplaced fracture of proximal phalanx of left ring finger 07/25/2021   Displaced fracture of proximal phalanx of left little finger, initial encounter for closed fracture 07/07/2021   Onychomycosis of toenail 03/22/2021   Dry skin dermatitis 03/22/2021   DM type 2 (diabetes mellitus, type 2) (HCC) 02/18/2021   COPD GOLD 0  11/08/2020   Depression 11/08/2020   Mixed hyperlipidemia 11/08/2020   Chronic respiratory failure with hypoxia (HCC) 10/17/2020   Mild sleep apnea 10/17/2020   Centrilobular emphysema (HCC) 06/26/2014   Primary hypertension 06/26/2014   Dyspnea 06/25/2014     Current Outpatient Medications on File Prior to Visit  Medication Sig Dispense Refill   albuterol (PROAIR HFA) 108 (90 Base) MCG/ACT inhaler Inhale 1-2 puffs into the lungs every 6 (six) hours as needed for wheezing or shortness of breath. 18 g 2   aspirin 81 MG tablet Take 81 mg by mouth daily.     atorvastatin (LIPITOR) 80 MG tablet Take 1 tablet (80 mg total) by mouth daily. 90 tablet 1   Camphor-Eucalyptus-Menthol (VICKS VAPORUB EX) Apply 1 application topically 2 (two) times daily as needed (breathe and soreness).     carvedilol (COREG) 12.5 MG tablet Take 1 tablet (12.5 mg total) by mouth 2 (two) times daily with a meal. 180 tablet 3   Continuous Glucose Receiver (FREESTYLE LIBRE 2  READER) DEVI Use to check glucose continuously throughout the day. E11.69 1 each 0   Continuous Glucose Sensor (FREESTYLE LIBRE 2 SENSOR) MISC Place 1 sensor on the skin every 14 days. Use to check glucose continuously. E11.69 2 each 6   esomeprazole (NEXIUM) 40 MG capsule Take 1 capsule (40 mg total) by mouth daily. 90 capsule 3   FLUoxetine (PROZAC) 40 MG capsule Take 1 capsule (40 mg total) by mouth daily. 90 capsule 0   Fluticasone-Umeclidin-Vilant (TRELEGY ELLIPTA) 100-62.5-25 MCG/ACT AEPB USE 1 INHALATION BY MOUTH  ONCE  DAILY AT THE SAME TIME EACH DAY 180 each 1   linaclotide (LINZESS) 145 MCG CAPS capsule Take 1 capsule (145 mcg total) by mouth daily before breakfast. 100 capsule 1   lisinopril-hydrochlorothiazide (ZESTORETIC) 20-12.5 MG tablet Take 1 tablet by mouth daily. 90 tablet 1   metFORMIN (GLUCOPHAGE-XR) 500 MG 24 hr tablet Take 1 tablet (500 mg total) by mouth 2 (two) times daily with a meal. 160 tablet 1   montelukast (SINGULAIR) 10 MG tablet Take 1 tablet (10 mg total) by mouth at bedtime. 90 tablet 3   Naproxen Sodium (ALEVE PO) Take 1 tablet by mouth at bedtime as needed.     nitroGLYCERIN (NITROSTAT) 0.3 MG SL tablet DISSOLVE 1 TABLET UNDER THE  TONGUE EVERY 5 MINUTES AS NEEDED FOR CHEST PAIN. MAX OF 3 TABLETS IN 15 MINUTES. CALL 911 IF PAIN  PERSISTS. 100 tablet 3   Semaglutide,0.25 or 0.5MG /DOS, 2 MG/3ML SOPN Inject 0.25 mg into the skin once a week. 3 mL 0   tiZANidine (ZANAFLEX) 2 MG tablet Take 2 mg by mouth every 6 (six) hours as needed.     traZODone (DESYREL) 50 MG tablet Take 1 tablet (50 mg total) by mouth at bedtime as needed. 90 tablet 3   No current facility-administered medications on file prior to visit.    Allergies  Allergen Reactions   Flonase [Fluticasone Propionate]     Nose bleeds    Social History   Socioeconomic History   Marital status: Single    Spouse name: Not on file   Number of children: Not on file   Years of education: Not on file   Highest education level: Not on file  Occupational History   Not on file  Tobacco Use   Smoking status: Former    Current packs/day: 0.00    Average packs/day: 1 pack/day for 38.0 years (38.0 ttl pk-yrs)    Types: Cigarettes    Start date: 01/15/1970    Quit date: 01/16/2008    Years since quitting: 14.9   Smokeless tobacco: Never  Vaping Use   Vaping status: Never Used  Substance and Sexual Activity   Alcohol use: No    Alcohol/week: 0.0 standard drinks of alcohol   Drug use: No   Sexual activity: Not on file   Other Topics Concern   Not on file  Social History Narrative   Not on file   Social Drivers of Health   Financial Resource Strain: Low Risk  (12/31/2022)   Overall Financial Resource Strain (CARDIA)    Difficulty of Paying Living Expenses: Not very hard  Food Insecurity: No Food Insecurity (12/31/2022)   Hunger Vital Sign    Worried About Running Out of Food in the Last Year: Never true    Ran Out of Food in the Last Year: Never true  Transportation Needs: No Transportation Needs (12/31/2022)   PRAPARE - Administrator, Civil Service (Medical): No  Lack of Transportation (Non-Medical): No  Physical Activity: Inactive (12/31/2022)   Exercise Vital Sign    Days of Exercise per Week: 0 days    Minutes of Exercise per Session: 0 min  Stress: No Stress Concern Present (12/31/2022)   [employer] of Occupational Health - Occupational Stress Questionnaire    Feeling of Stress : Not at all  Social Connections: Socially Isolated (12/31/2022)   Social Connection and Isolation Panel [NHANES]    Frequency of Communication with Friends and Family: Once a week    Frequency of Social Gatherings with Friends and Family: Never    Attends Religious Services: Never    Database administrator or Organizations: No    Attends Banker Meetings: Never    Marital Status: Divorced  Catering manager Violence: Not At Risk (12/31/2022)   Humiliation, Afraid, Rape, and Kick questionnaire    Fear of Current or Ex-Partner: No    Emotionally Abused: No    Physically Abused: No    Sexually Abused: No    Family History  Problem Relation Age of Onset   Allergies Mother    Allergies Daughter    Asthma Son        had as a child    Past Surgical History:  Procedure Laterality Date   TEE WITHOUT CARDIOVERSION N/A 08/24/2014   Procedure: TRANSESOPHAGEAL ECHOCARDIOGRAM (TEE);  Surgeon: Yates Decamp, MD;  Location: [facility] ENDOSCOPY;  Service: Cardiovascular;  Laterality: N/A;    TOTAL ABDOMINAL HYSTERECTOMY  01/15/2005    ROS: Review of Systems Negative except as stated above  PHYSICAL EXAM: BP (!) 140/80   Pulse 79   Temp 98.3 F (36.8 C) (Oral)   Ht 5\' 7"  (1.702 m)   Wt 210 lb (95.3 kg)   SpO2 93%   BMI 32.89 kg/m   Wt Readings from Last 3 Encounters:  12/31/22 210 lb (95.3 kg)  11/15/22 207 lb (93.9 kg)  06/22/22 207 lb (93.9 kg)    Physical Exam  General appearance - alert, well appearing, and in no distress Mental status - normal mood, behavior, speech, dress, motor activity, and thought processes Neck - supple, no significant adenopathy Chest -breath sounds are moderately decreased bilaterally without wheezes or crackles Heart - normal rate, regular rhythm, normal S1, S2, no murmurs, rubs, clicks or gallops Extremities -no lower extremity edema.  Patient fingernails along painted with dark polish gel.  I attempted to get pulse ox reading but this took a while to register.  When it did, reading was 93% on room air      Latest Ref Rng & Units 11/15/2022    4:25 PM 01/29/2022    2:03 PM 08/03/2021    9:17 AM  CMP  Glucose 70 - 99 mg/dL 324  92  97   BUN 8 - 27 mg/dL 11  10  10    Creatinine 0.57 - 1.00 mg/dL 4.01  0.27  2.53   Sodium 134 - 144 mmol/L 139  145  141   Potassium 3.5 - 5.2 mmol/L 4.0  3.9  3.8   Chloride 96 - 106 mmol/L 104  110  107   CO2 20 - 29 mmol/L 21  23  23    Calcium 8.7 - 10.3 mg/dL 66.4  40.3  47.4   Total Protein 6.0 - 8.5 g/dL 6.7  7.0  6.7   Total Bilirubin 0.0 - 1.2 mg/dL 0.4  0.5  0.4   Alkaline Phos 44 - 121 IU/L 61  AST 0 - 40 IU/L 17  16  22    ALT 0 - 32 IU/L 22  20  30     Lipid Panel     Component Value Date/Time   CHOL 172 01/29/2022 1403   CHOL 166 03/29/2021 0000   TRIG 128 01/29/2022 1403   HDL 56 01/29/2022 1403   HDL 51 03/29/2021 0000   CHOLHDL 3.1 01/29/2022 1403   LDLCALC 94 01/29/2022 1403    CBC    Component Value Date/Time   WBC 5.7 01/29/2022 1403   RBC 4.98 01/29/2022 1403    HGB 14.9 01/29/2022 1403   HGB 13.9 11/08/2020 1509   HCT 45.2 (H) 01/29/2022 1403   HCT 41.8 11/08/2020 1509   PLT 237 01/29/2022 1403   PLT 217 11/08/2020 1509   MCV 90.8 01/29/2022 1403   MCV 90 11/08/2020 1509   MCH 29.9 01/29/2022 1403   MCHC 33.0 01/29/2022 1403   RDW 12.8 01/29/2022 1403   RDW 12.6 11/08/2020 1509   LYMPHSABS 1,636 01/29/2022 1403   LYMPHSABS 1.8 11/08/2020 1509   MONOABS 0.3 08/01/2008 1640   EOSABS 57 01/29/2022 1403   EOSABS 0.1 11/08/2020 1509   BASOSABS 29 01/29/2022 1403   BASOSABS 0.0 11/08/2020 1509    ASSESSMENT AND PLAN: 1. Type 2 diabetes mellitus with obesity (HCC) (Primary) Diabetes at goal based on last A1c.  She is tolerating Ozempic 0.25 mg dose.  She has just completed her second dose.  She will continue at this dose for total of 4 weeks and if she is still tolerating it at that point, we can increase it to the 0.5 mg.  Continue metformin.  Once she runs out of her her current continuous glucose monitor, she will change to using manual glucometer to check blood sugars once a day.  Encourage healthy eating habits.  2. Hypertension associated with type 2 diabetes mellitus (HCC) Repeat blood pressure improved but not at goal.  Increase amlodipine to 10 mg daily continue carvedilol 12.5 mg twice a day and lisinopril/HCTZ 20/12.5 mg daily - amLODipine (NORVASC) 10 MG tablet; Take 1 tablet (10 mg total) by mouth daily.  Dispense: 90 tablet; Refill: 1  3. Hyperlipidemia due to type 2 diabetes mellitus (HCC) Atorvastatin 80 mg daily continue  4. Centrilobular emphysema (HCC) Continue Trelegy inhaler. Advised that when she removes current polish and gel of her fingernails can call us for a nurse only visit for [place] to repeat pulse ox to make sure that her readings are still in the upper 90s at rest and exertion.   Patient was given the opportunity to ask questions.  Patient verbalized understanding of the plan and was able to repeat key elements  of the plan.   This documentation was completed using Paediatric nurse.  Any transcriptional errors are unintentional.  No orders of the defined types were placed in this encounter.    Requested Prescriptions   Signed Prescriptions Disp Refills   amLODipine (NORVASC) 10 MG tablet 90 tablet 1    Sig: Take 1 tablet (10 mg total) by mouth daily.    Return in about 3 months (around 03/31/2023).  Jonah Blue, MD, FACP

## 2022-12-31 NOTE — Patient Instructions (Signed)
Increase Amlodipine to 10mg daily.

## 2023-01-01 ENCOUNTER — Other Ambulatory Visit (HOSPITAL_COMMUNITY): Payer: Self-pay

## 2023-01-04 ENCOUNTER — Other Ambulatory Visit: Payer: Self-pay | Admitting: Physician Assistant

## 2023-01-04 DIAGNOSIS — E1165 Type 2 diabetes mellitus with hyperglycemia: Secondary | ICD-10-CM

## 2023-01-08 ENCOUNTER — Other Ambulatory Visit: Payer: Self-pay | Admitting: Physician Assistant

## 2023-01-08 DIAGNOSIS — E1165 Type 2 diabetes mellitus with hyperglycemia: Secondary | ICD-10-CM

## 2023-01-10 ENCOUNTER — Other Ambulatory Visit (HOSPITAL_COMMUNITY): Payer: Self-pay

## 2023-01-10 ENCOUNTER — Other Ambulatory Visit: Payer: Self-pay

## 2023-02-08 ENCOUNTER — Encounter: Payer: Self-pay | Admitting: Pharmacist

## 2023-02-08 ENCOUNTER — Ambulatory Visit: Payer: 59 | Attending: Internal Medicine | Admitting: Pharmacist

## 2023-02-08 DIAGNOSIS — Z7985 Long-term (current) use of injectable non-insulin antidiabetic drugs: Secondary | ICD-10-CM | POA: Diagnosis not present

## 2023-02-08 DIAGNOSIS — E669 Obesity, unspecified: Secondary | ICD-10-CM | POA: Diagnosis not present

## 2023-02-08 DIAGNOSIS — E1169 Type 2 diabetes mellitus with other specified complication: Secondary | ICD-10-CM | POA: Diagnosis not present

## 2023-02-08 DIAGNOSIS — Z7984 Long term (current) use of oral hypoglycemic drugs: Secondary | ICD-10-CM

## 2023-02-08 MED ORDER — SEMAGLUTIDE (1 MG/DOSE) 4 MG/3ML ~~LOC~~ SOPN: 1.0000 mg | PEN_INJECTOR | SUBCUTANEOUS | 1 refills | Status: DC

## 2023-02-08 NOTE — Progress Notes (Signed)
Patient was educated on the use of the Ozempic pen. Reviewed necessary supplies and operation of the pen. Her sister has been assisting her with her injections. I had her follow-up today to ensure she was using Ozempic as prescribed. She confirms she is using 0.5 mg weekly of Ozempic and is tolerating this well. Has taken for ~1 month with no NV, changes in vision, or abdominal pain. I have advised her to increase to 1 mg weekly for goal weight loss and appetite suppression. Also reviewed goal blood glucose levels.All questions and concerns were addressed.  Written patient instructions provided. Patient verbalized understanding of treatment plan.  Total time in face to face counseling 25 minutes.    Follow-up:  Pharmacist as needed PCP clinic visit 03/15/2023  Butch Penny, PharmD, Patsy Baltimore, CPP Clinical Pharmacist [facility] [phone]

## 2023-02-19 ENCOUNTER — Other Ambulatory Visit: Payer: Self-pay | Admitting: Internal Medicine

## 2023-02-19 ENCOUNTER — Other Ambulatory Visit (HOSPITAL_COMMUNITY): Payer: Self-pay

## 2023-02-19 ENCOUNTER — Other Ambulatory Visit: Payer: Self-pay | Admitting: Pulmonary Disease

## 2023-02-19 DIAGNOSIS — J449 Chronic obstructive pulmonary disease, unspecified: Secondary | ICD-10-CM

## 2023-02-19 NOTE — Telephone Encounter (Signed)
 Copied from CRM [id number]. Topic: Clinical - Medication Refill >> Feb 19, 2023  9:37 AM [name] wrote: Most Recent Primary Care Visit:  Provider: FLEETA TONIA GARNETTE LITTIE  Department: [facility]  Visit Type: OFFICE VISIT  Date: 02/08/2023  Medication: Fluticasone -Umeclidin-Vilant (TRELEGY ELLIPTA ) 100-62.5-25 MCG/ACT AEPB  Has the patient contacted their pharmacy? Yes  Called the pharmacy, [facility].  Abigail states it iis too soon to be refilled.  Not due until 4/12.  She said it also looks like there is another Rx out somewhere.  Pt states it may be [facility]. Pt states she hs looked for this Rx and cannot find another.  She said she needs b/c she does this everyday. Is this the correct pharmacy for this prescription? Yes [facility], they say it is too soon to be refilled This is the patient's preferred pharmacy:   [name] - [facility] [address] [place] [address] Phone: [phone] Fax: [phone]   Has the prescription been filled recently? Yes  Is the patient out of the medication? Yes  Has the patient been seen for an appointment in the last year OR does the patient have an upcoming appointment? Yes  Can we respond through MyChart? Yes  Agent: Please be advised that Rx refills may take up to 3 business days. We ask that you follow-up with your pharmacy.

## 2023-02-19 NOTE — Telephone Encounter (Signed)
Last Fill: 12/17/22  Last OV: 12/31/22 Next OV: 03/15/23  Routing to provider for review/authorization.

## 2023-02-20 ENCOUNTER — Telehealth: Payer: Self-pay

## 2023-02-20 MED ORDER — TRELEGY ELLIPTA 100-62.5-25 MCG/ACT IN AEPB: 1.0000 | INHALATION_SPRAY | Freq: Every day | RESPIRATORY_TRACT | 1 refills | Status: DC

## 2023-02-20 NOTE — Telephone Encounter (Signed)
 Copied from CRM [id number]. Topic: Clinical - Prescription Issue >> Feb 19, 2023  4:27 PM Deleta HERO wrote: Reason for CRM: PT is wanting  Fluticasone -Umeclidin-Vilant (TRELEGY ELLIPTA ) 100-62.5-25 MCG/ACT AEPB signed off as soon as possible, states she was informed she would receive a callback from a nurse and she never did.  Medication sent to pharmacy today

## 2023-02-21 ENCOUNTER — Ambulatory Visit
Admission: RE | Admit: 2023-02-21 | Discharge: 2023-02-21 | Disposition: A | Discharge status: Home / Self Care | Payer: 59 | Source: Ambulatory Visit | Attending: Family | Admitting: Family

## 2023-02-21 DIAGNOSIS — Z1231 Encounter for screening mammogram for malignant neoplasm of breast: Secondary | ICD-10-CM | POA: Diagnosis not present

## 2023-02-21 DIAGNOSIS — M8588 Other specified disorders of bone density and structure, other site: Secondary | ICD-10-CM | POA: Diagnosis not present

## 2023-02-25 ENCOUNTER — Other Ambulatory Visit: Payer: Self-pay | Admitting: Internal Medicine

## 2023-02-25 ENCOUNTER — Other Ambulatory Visit (HOSPITAL_COMMUNITY): Payer: Self-pay

## 2023-02-25 ENCOUNTER — Telehealth: Payer: Self-pay

## 2023-02-25 DIAGNOSIS — K5900 Constipation, unspecified: Secondary | ICD-10-CM

## 2023-02-25 MED ORDER — LINACLOTIDE 145 MCG PO CAPS
145.0000 ug | ORAL_CAPSULE | Freq: Every day | ORAL | 1 refills | Status: DC
  Filled 2023-02-25 – 2023-02-27 (×2): qty 100, 100d supply, fill #0
  Filled 2023-03-13: qty 90, 90d supply, fill #0

## 2023-02-25 NOTE — Telephone Encounter (Signed)
 Copied from CRM [id number]. Topic: Clinical - Medication Refill >> Feb 25, 2023 12:10 PM Elle L wrote: Most Recent Primary Care Visit:  Provider: Valente Gaskin  Department: [facility]  Visit Type: OFFICE VISIT  Date: 02/08/2023  Medication: linaclotide  (LINZESS ) 145 MCG CAPS capsule  Has the patient contacted their pharmacy? Yes  Is this the correct pharmacy for this prescription? Yes  This is the patient's preferred pharmacy:   Melodee Spruce LONG - [facility] [address] [place] [address] Phone: [phone] Fax: [phone]  Has the prescription been filled recently? No  Is the patient out of the medication? Yes  Has the patient been seen for an appointment in the last year OR does the patient have an upcoming appointment? Yes  Can we respond through MyChart? No. The patient needs a call back.  Agent: Please be advised that Rx refills may take up to 3 business days. We ask that you follow-up with your pharmacy.

## 2023-02-25 NOTE — Telephone Encounter (Signed)
 Last Fill: 12/17/22   Last OV: 02/08/23 Next OV: 03/15/23  Routing to provider for review/authorization.

## 2023-02-25 NOTE — Telephone Encounter (Signed)
 Copied from CRM [id number]. Topic: Clinical - Lab/Test Results >> Feb 25, 2023 12:19 PM Elle L wrote: Reason for CRM:  I relayed the patient's lab results to her. However, she had more questions regarding Metformin  dosage change and her calcium  levels. Her call back number is [phone].

## 2023-02-27 ENCOUNTER — Other Ambulatory Visit: Payer: Self-pay

## 2023-02-27 ENCOUNTER — Encounter: Payer: Self-pay | Admitting: Internal Medicine

## 2023-02-27 ENCOUNTER — Other Ambulatory Visit: Payer: Self-pay | Admitting: Family

## 2023-02-27 ENCOUNTER — Other Ambulatory Visit (HOSPITAL_COMMUNITY): Payer: Self-pay

## 2023-02-27 DIAGNOSIS — Z780 Asymptomatic menopausal state: Secondary | ICD-10-CM | POA: Insufficient documentation

## 2023-02-27 DIAGNOSIS — R928 Other abnormal and inconclusive findings on diagnostic imaging of breast: Secondary | ICD-10-CM

## 2023-02-27 NOTE — Progress Notes (Signed)
Let patient know that I received the results of her bone density study.  She has mild thinning of the bones but not severe enough to be classified as osteoporosis.  If she is not taking vitamin D supplement, I recommend taking Vitamin D800 international units daily This can be purchased OTC.  Regular exercise like walking will also help to preserve bone health.

## 2023-03-01 ENCOUNTER — Ambulatory Visit
Admission: RE | Admit: 2023-03-01 | Discharge: 2023-03-01 | Disposition: A | Discharge status: Home / Self Care | Payer: 59 | Source: Ambulatory Visit | Attending: Family | Admitting: Family

## 2023-03-01 ENCOUNTER — Other Ambulatory Visit: Payer: Self-pay | Admitting: Family

## 2023-03-01 DIAGNOSIS — R590 Localized enlarged lymph nodes: Secondary | ICD-10-CM

## 2023-03-01 DIAGNOSIS — R928 Other abnormal and inconclusive findings on diagnostic imaging of breast: Secondary | ICD-10-CM

## 2023-03-01 DIAGNOSIS — N6315 Unspecified lump in the right breast, overlapping quadrants: Secondary | ICD-10-CM | POA: Diagnosis not present

## 2023-03-01 DIAGNOSIS — N6320 Unspecified lump in the left breast, unspecified quadrant: Secondary | ICD-10-CM

## 2023-03-06 ENCOUNTER — Other Ambulatory Visit: Payer: 59

## 2023-03-06 NOTE — Addendum Note (Signed)
Addended by: Jonah Blue B on: 03/06/2023 10:49 AM   Modules accepted: Orders

## 2023-03-06 NOTE — Telephone Encounter (Signed)
Phone call placed today the patient to address her questions about metformin and calcium level. -Patient wanting to verify what dose of metformin she should be taking.  She states that she is taking 500 mg twice a day as I had prescribed it.  However she tells me that someone called her and told her that she should be taking 750 mg twice a day.  I was unable to find that conversation in the chart.  I told her that she should stay on the 500 mg twice a day until she sees me later this month.  -In regards to the elevated calcium level, I told patient that she is on lisinopril/hydrochlorothiazide.  The thiazide component can sometimes cause elevated calcium level.  However we would need to do additional blood test to evaluate further.  Certain cancers can also cause elevated calcium level.  Advised that she comes to the lab at her convenience to have the blood test done.  I inquired whether she is taking vitamin D.  Patient tells me she is taking vitamin D3 400 international units 2 tablets daily from OTC. -I note that recent mammogram showed concerning mass in the left breast.  Patient states she is scheduled to have biopsy done on Monday and plans to keep that appointment.

## 2023-03-11 ENCOUNTER — Ambulatory Visit
Admission: RE | Admit: 2023-03-11 | Discharge: 2023-03-11 | Disposition: A | Discharge status: Home / Self Care | Payer: 59 | Source: Ambulatory Visit | Attending: Family | Admitting: Family

## 2023-03-11 DIAGNOSIS — N6320 Unspecified lump in the left breast, unspecified quadrant: Secondary | ICD-10-CM

## 2023-03-11 DIAGNOSIS — R590 Localized enlarged lymph nodes: Secondary | ICD-10-CM

## 2023-03-11 DIAGNOSIS — C50812 Malignant neoplasm of overlapping sites of left female breast: Secondary | ICD-10-CM | POA: Diagnosis not present

## 2023-03-11 DIAGNOSIS — N6325 Unspecified lump in the left breast, overlapping quadrants: Secondary | ICD-10-CM | POA: Diagnosis not present

## 2023-03-11 HISTORY — PX: BREAST BIOPSY: SHX20

## 2023-03-12 LAB — SURGICAL PATHOLOGY

## 2023-03-13 ENCOUNTER — Telehealth: Payer: Self-pay | Admitting: *Deleted

## 2023-03-13 ENCOUNTER — Telehealth: Payer: Self-pay | Admitting: Internal Medicine

## 2023-03-13 ENCOUNTER — Other Ambulatory Visit: Payer: Self-pay

## 2023-03-13 ENCOUNTER — Other Ambulatory Visit (HOSPITAL_COMMUNITY): Payer: Self-pay

## 2023-03-13 DIAGNOSIS — C50912 Malignant neoplasm of unspecified site of left female breast: Secondary | ICD-10-CM | POA: Insufficient documentation

## 2023-03-13 NOTE — Telephone Encounter (Signed)
 Phone call placed to patient this morning.  Patient identified using 2 identifiers.  I informed her that I received the the pathology results from biopsy done on the left breast.  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  Patient confirms that [facility] imaging has already called and went over the results with her.  She was [facility] to the [facility].  According to the results of note, appointment is scheduled for March 5.  Patient was not able to confirm offhand that date but states that she has it written down from her conversation with them.  Patient encouraged to keep the appointment.

## 2023-03-13 NOTE — Telephone Encounter (Signed)
-----   Message from Donalee Citrin Ngetich sent at 03/12/2023  8:11 AM EST ----- Regarding: lymph node biopsy  ----- Message ----- From: Leory Plowman, Rad Results In Sent: 03/11/2023   4:58 PM EST To: Caesar Bookman, NP

## 2023-03-13 NOTE — Telephone Encounter (Signed)
 Called to give patient appointment info she doesn't have a voicemail that is set up, I will try and send an email and call again tomorrow

## 2023-03-14 ENCOUNTER — Telehealth: Payer: Self-pay | Admitting: *Deleted

## 2023-03-14 NOTE — Telephone Encounter (Signed)
 Spoke to patient to confirm upcoming morning [facility] clinic appointment on 3/5, paperwork will be sent via mail.  Gave location and time, also informed patient that the surgeon's office would be calling as well to get information from them similar to the packet that they will be receiving so make sure to do both.  Reminded patient that all providers will be coming to the clinic to see them HERE and if they had any questions to not hesitate to reach back out to myself or their navigators.

## 2023-03-15 ENCOUNTER — Encounter: Payer: Self-pay | Admitting: *Deleted

## 2023-03-15 ENCOUNTER — Ambulatory Visit: Payer: 59 | Attending: Internal Medicine | Admitting: Internal Medicine

## 2023-03-15 VITALS — BP 105/71 | HR 82 | Temp 97.8°F | Ht 67.0 in | Wt 189.0 lb

## 2023-03-15 DIAGNOSIS — C50912 Malignant neoplasm of unspecified site of left female breast: Secondary | ICD-10-CM | POA: Diagnosis not present

## 2023-03-15 DIAGNOSIS — Z170 Estrogen receptor positive status [ER+]: Secondary | ICD-10-CM | POA: Insufficient documentation

## 2023-03-15 DIAGNOSIS — Z7984 Long term (current) use of oral hypoglycemic drugs: Secondary | ICD-10-CM

## 2023-03-15 DIAGNOSIS — E785 Hyperlipidemia, unspecified: Secondary | ICD-10-CM | POA: Diagnosis not present

## 2023-03-15 DIAGNOSIS — I152 Hypertension secondary to endocrine disorders: Secondary | ICD-10-CM

## 2023-03-15 DIAGNOSIS — E1169 Type 2 diabetes mellitus with other specified complication: Secondary | ICD-10-CM

## 2023-03-15 DIAGNOSIS — I10 Essential (primary) hypertension: Secondary | ICD-10-CM

## 2023-03-15 DIAGNOSIS — J432 Centrilobular emphysema: Secondary | ICD-10-CM | POA: Diagnosis not present

## 2023-03-15 DIAGNOSIS — Z7985 Long-term (current) use of injectable non-insulin antidiabetic drugs: Secondary | ICD-10-CM | POA: Diagnosis not present

## 2023-03-15 DIAGNOSIS — E669 Obesity, unspecified: Secondary | ICD-10-CM | POA: Diagnosis not present

## 2023-03-15 DIAGNOSIS — Z6829 Body mass index (BMI) 29.0-29.9, adult: Secondary | ICD-10-CM | POA: Diagnosis not present

## 2023-03-15 DIAGNOSIS — E119 Type 2 diabetes mellitus without complications: Secondary | ICD-10-CM

## 2023-03-15 LAB — POCT GLYCOSYLATED HEMOGLOBIN (HGB A1C): HbA1c, POC (controlled diabetic range): 6.2 % (ref 0.0–7.0)

## 2023-03-15 LAB — GLUCOSE, POCT (MANUAL RESULT ENTRY): POC Glucose: 88 mg/dL (ref 70–99)

## 2023-03-15 NOTE — Progress Notes (Signed)
 [name], female    DOB: 09-Oct-1955  MRN: [id number]  CC: Diabetes (DM f/u./No questions / concerns/Already received flu vax)   Subjective: [name] is a 68 y.o. female who presents for chronic ds management. Her concerns today include:  Patient with history of HTN, HL, COPD, DM type II, anxiety/depression,   Discussed the use of AI scribe software for clinical note transcription with the patient, who gave verbal consent to proceed.  History of Present Illness   The patient, with a history of diabetes, hypertension, hyperlipidemia, and COPD, presents after a recent diagnosis of left breast cancer.  Pathology showed invasive ductal carcinoma, ductal carcinoma in situ.  She reports that the diagnosis has been consuming her thoughts, overshadowing her other health concerns.  She has an appointment next week with the cancer center at [facility].  DM:  Results for orders placed or performed in visit on 03/15/23  POCT glucose (manual entry)   Collection Time: 03/15/23  3:18 PM  Result Value Ref Range   POC Glucose 88 70 - 99 mg/dl  POCT glycosylated hemoglobin (Hb A1C)   Collection Time: 03/15/23  3:49 PM  Result Value Ref Range   Hemoglobin A1C     HbA1c POC (<> result, manual entry)     HbA1c, POC (prediabetic range)     HbA1c, POC (controlled diabetic range) 6.2 0.0 - 7.0 %  She is on metformin XR 500 mg twice a day and Ozempic 1 mg once a week. She reports tolerating the Ozempic well, with no vomiting or stomach pains.  She is down 21 pounds since October.  She feels good about that.  Doing okay with her eating habits. In regards to her blood pressure she is taking amlodipine 10 mg daily, carvedilol 12.5 mg twice a day, lisinopril/HCTZ 20/12.5 mg daily.  Reports compliance with atorvastatin 80 mg daily for cholesterol  COPD: No recent flares.  She is using a Trelegy inhaler daily and ProAir, though the latter is used infrequently, typically after getting up at  night to use the bathroom.  Hypercalcemia: She was supposed to come to the lab to have additional blood test done for further workup on this.  She is willing to stop at the lab today.  Depression/anxiety: She is on Prozac 40 mg daily and feels she is doing okay.  She feels concerned with recent cancer diagnosis.  She has good support from her sister.        Patient Active Problem List   Diagnosis Date Noted   Invasive ductal carcinoma of breast, left (HCC) 03/13/2023   Osteopenia after menopause 02/27/2023   Nondisplaced fracture of proximal phalanx of left ring finger 07/25/2021   Displaced fracture of proximal phalanx of left little finger, initial encounter for closed fracture 07/07/2021   Onychomycosis of toenail 03/22/2021   Dry skin dermatitis 03/22/2021   DM type 2 (diabetes mellitus, type 2) (HCC) 02/18/2021   COPD GOLD 0  11/08/2020   Depression 11/08/2020   Mixed hyperlipidemia 11/08/2020   Mild sleep apnea 10/17/2020   Centrilobular emphysema (HCC) 06/26/2014   Primary hypertension 06/26/2014   Dyspnea 06/25/2014     Current Outpatient Medications on File Prior to Visit  Medication Sig Dispense Refill   albuterol (PROAIR HFA) 108 (90 Base) MCG/ACT inhaler Inhale 1-2 puffs into the lungs every 6 (six) hours as needed for wheezing or shortness of breath. 18 g 2   amLODipine (NORVASC) 10 MG tablet Take 1 tablet (  10 mg total) by mouth daily. 100 tablet 1   aspirin 81 MG tablet Take 81 mg by mouth daily.     atorvastatin (LIPITOR) 80 MG tablet Take 1 tablet (80 mg total) by mouth daily. 90 tablet 1   Camphor-Eucalyptus-Menthol (VICKS VAPORUB EX) Apply 1 application topically 2 (two) times daily as needed (breathe and soreness).     carvedilol (COREG) 12.5 MG tablet Take 1 tablet (12.5 mg total) by mouth 2 (two) times daily with a meal. 180 tablet 3   Continuous Glucose Receiver (FREESTYLE LIBRE 2 READER) DEVI Use to check glucose continuously throughout the day. E11.69 1 each  0   Continuous Glucose Sensor (FREESTYLE LIBRE 2 SENSOR) MISC Place 1 sensor on the skin every 14 days. Use to check glucose continuously. E11.69 2 each 6   esomeprazole (NEXIUM) 40 MG capsule Take 1 capsule (40 mg total) by mouth daily. 90 capsule 3   FLUoxetine (PROZAC) 40 MG capsule Take 1 capsule (40 mg total) by mouth daily. 90 capsule 0   Fluticasone-Umeclidin-Vilant (TRELEGY ELLIPTA) 100-62.5-25 MCG/ACT AEPB Take 1 puff by mouth daily. 180 each 1   linaclotide (LINZESS) 145 MCG CAPS capsule Take 1 capsule (145 mcg total) by mouth daily before breakfast. 100 capsule 1   lisinopril-hydrochlorothiazide (ZESTORETIC) 20-12.5 MG tablet Take 1 tablet by mouth daily. 90 tablet 1   metFORMIN (GLUCOPHAGE-XR) 500 MG 24 hr tablet TAKE 1 TABLET BY MOUTH TWICE  DAILY WITH MEALS 180 tablet 1   montelukast (SINGULAIR) 10 MG tablet Take 1 tablet (10 mg total) by mouth at bedtime. 90 tablet 3   Naproxen Sodium (ALEVE PO) Take 1 tablet by mouth at bedtime as needed.     nitroGLYCERIN (NITROSTAT) 0.3 MG SL tablet DISSOLVE 1 TABLET UNDER THE  TONGUE EVERY 5 MINUTES AS NEEDED FOR CHEST PAIN. MAX OF 3 TABLETS IN 15 MINUTES. CALL 911 IF PAIN  PERSISTS. 100 tablet 3   Semaglutide, 1 MG/DOSE, 4 MG/3ML SOPN Inject 1 mg as directed once a week. 9 mL 1   tiZANidine (ZANAFLEX) 2 MG tablet Take 2 mg by mouth every 6 (six) hours as needed.     traZODone (DESYREL) 50 MG tablet Take 1 tablet (50 mg total) by mouth at bedtime as needed. 90 tablet 3   No current facility-administered medications on file prior to visit.    Allergies  Allergen Reactions   Flonase [Fluticasone Propionate]     Nose bleeds    Social History   Socioeconomic History   Marital status: Single    Spouse name: Not on file   Number of children: Not on file   Years of education: Not on file   Highest education level: Not on file  Occupational History   Not on file  Tobacco Use   Smoking status: Former    Current packs/day: 0.00     Average packs/day: 1 pack/day for 38.0 years (38.0 ttl pk-yrs)    Types: Cigarettes    Start date: 01/15/1970    Quit date: 01/16/2008    Years since quitting: 15.1   Smokeless tobacco: Never  Vaping Use   Vaping status: Never Used  Substance and Sexual Activity   Alcohol use: No    Alcohol/week: 0.0 standard drinks of alcohol   Drug use: No   Sexual activity: Not on file  Other Topics Concern   Not on file  Social History Narrative   Not on file   Social Drivers of Health   Financial Resource Strain:  Low Risk  (12/31/2022)   Overall Financial Resource Strain (CARDIA)    Difficulty of Paying Living Expenses: Not very hard  Food Insecurity: No Food Insecurity (12/31/2022)   Hunger Vital Sign    Worried About Running Out of Food in the Last Year: Never true    Ran Out of Food in the Last Year: Never true  Transportation Needs: No Transportation Needs (12/31/2022)   PRAPARE - Administrator, Civil Service (Medical): No    Lack of Transportation (Non-Medical): No  Physical Activity: Inactive (12/31/2022)   Exercise Vital Sign    Days of Exercise per Week: 0 days    Minutes of Exercise per Session: 0 min  Stress: No Stress Concern Present (12/31/2022)   [employer] of Occupational Health - Occupational Stress Questionnaire    Feeling of Stress : Not at all  Social Connections: Socially Isolated (12/31/2022)   Social Connection and Isolation Panel [NHANES]    Frequency of Communication with Friends and Family: Once a week    Frequency of Social Gatherings with Friends and Family: Never    Attends Religious Services: Never    Database administrator or Organizations: No    Attends Banker Meetings: Never    Marital Status: Divorced  Catering manager Violence: Not At Risk (12/31/2022)   Humiliation, Afraid, Rape, and Kick questionnaire    Fear of Current or Ex-Partner: No    Emotionally Abused: No    Physically Abused: No    Sexually Abused: No     Family History  Problem Relation Age of Onset   Allergies Mother    Allergies Daughter    Asthma Son        had as a child   BRCA 1/2 Neg Hx    Breast cancer Neg Hx     Past Surgical History:  Procedure Laterality Date   BREAST BIOPSY Left 03/11/2023   [place] LT BREAST BX W LOC DEV 1ST LESION IMG BX SPEC US GUIDE 03/11/2023 [facility] MAMMOGRAPHY   TEE WITHOUT CARDIOVERSION N/A 08/24/2014   Procedure: TRANSESOPHAGEAL ECHOCARDIOGRAM (TEE);  Surgeon: Yates Decamp, MD;  Location: [facility] ENDOSCOPY;  Service: Cardiovascular;  Laterality: N/A;   TOTAL ABDOMINAL HYSTERECTOMY  01/15/2005    ROS: Review of Systems Negative except as stated above  PHYSICAL EXAM: BP 105/71 (BP Location: Left Arm, Patient Position: Sitting, Cuff Size: Normal)   Pulse 82   Temp 97.8 F (36.6 C) (Oral)   Ht 5\' 7"  (1.702 m)   Wt 189 lb (85.7 kg)   SpO2 97%   BMI 29.60 kg/m   Wt Readings from Last 3 Encounters:  03/15/23 189 lb (85.7 kg)  12/31/22 210 lb (95.3 kg)  11/15/22 207 lb (93.9 kg)    Physical Exam  General appearance - alert, well appearing, and in no distress Mental status -patient seems a bit preoccupied today and not as focused.  She attributes this to the recent diagnosis of breast cancer. Neck - supple, no significant adenopathy Chest - clear to auscultation, no wheezes, rales or rhonchi, symmetric air entry Heart - normal rate, regular rhythm, normal S1, S2, no murmurs, rubs, clicks or gallopsExtremities - peripheral pulses normal, no pedal edema, no clubbing or cyanosis Breast: She showed me the left breast where the biopsy was done.  She has slight bruising in the area but otherwise appears to be healing well.     Latest Ref Rng & Units 11/15/2022    4:25 PM 01/29/2022  2:03 PM 08/03/2021    9:17 AM  CMP  Glucose 70 - 99 mg/dL 161  92  97   BUN 8 - 27 mg/dL 11  10  10    Creatinine 0.57 - 1.00 mg/dL 0.96  0.45  4.09   Sodium 134 - 144 mmol/L 139  145  141   Potassium 3.5 - 5.2 mmol/L  4.0  3.9  3.8   Chloride 96 - 106 mmol/L 104  110  107   CO2 20 - 29 mmol/L 21  23  23    Calcium 8.7 - 10.3 mg/dL 81.1  91.4  78.2   Total Protein 6.0 - 8.5 g/dL 6.7  7.0  6.7   Total Bilirubin 0.0 - 1.2 mg/dL 0.4  0.5  0.4   Alkaline Phos 44 - 121 IU/L 61     AST 0 - 40 IU/L 17  16  22    ALT 0 - 32 IU/L 22  20  30     Lipid Panel     Component Value Date/Time   CHOL 172 01/29/2022 1403   CHOL 166 03/29/2021 0000   TRIG 128 01/29/2022 1403   HDL 56 01/29/2022 1403   HDL 51 03/29/2021 0000   CHOLHDL 3.1 01/29/2022 1403   LDLCALC 94 01/29/2022 1403    CBC    Component Value Date/Time   WBC 5.7 01/29/2022 1403   RBC 4.98 01/29/2022 1403   HGB 14.9 01/29/2022 1403   HGB 13.9 11/08/2020 1509   HCT 45.2 (H) 01/29/2022 1403   HCT 41.8 11/08/2020 1509   PLT 237 01/29/2022 1403   PLT 217 11/08/2020 1509   MCV 90.8 01/29/2022 1403   MCV 90 11/08/2020 1509   MCH 29.9 01/29/2022 1403   MCHC 33.0 01/29/2022 1403   RDW 12.8 01/29/2022 1403   RDW 12.6 11/08/2020 1509   LYMPHSABS 1,636 01/29/2022 1403   LYMPHSABS 1.8 11/08/2020 1509   MONOABS 0.3 08/01/2008 1640   EOSABS 57 01/29/2022 1403   EOSABS 0.1 11/08/2020 1509   BASOSABS 29 01/29/2022 1403   BASOSABS 0.0 11/08/2020 1509    ASSESSMENT AND PLAN: 1. Type 2 diabetes mellitus with obesity (HCC) (Primary) DM type II treated with oral medication Long-term use of injectable non-insulin antidiabetic medication At goal.  She has achieved significant weight loss on the Ozempic.  She will continue Ozempic 1 mg daily and metformin 500 mg twice a day. - POCT glycosylated hemoglobin (Hb A1C) - POCT glucose (manual entry)  2. Invasive ductal carcinoma of breast, left [facility]) Patient to keep appointment with cancer center next week. Patient mentally is dealing with the shock of the diagnosis.  She reports good support from her sister.  3. Hypertension associated with type 2 diabetes mellitus (HCC) At goal.  Continue amlodipine 10 mg  daily, carvedilol 12.5 mg twice a day, and lisinopril/HCTZ.  4. Centrilobular emphysema (HCC) Stable.  Continue Trelegy  5. Hypercalcemia Causes can include hyperparathyroidism, underlying cancer diagnosis, HCTZ - Basic Metabolic Panel - TSH - Parathyroid hormone, intact (no Ca) - VITAMIN D 25 Hydroxy (Vit-D Deficiency, Fractures)  6. Hyperlipidemia due to type 2 diabetes mellitus (HCC) Continue atorvastatin 80 mg daily.  Patient was given the opportunity to ask questions.  Patient verbalized understanding of the plan and was able to repeat key elements of the plan.   This documentation was completed using Paediatric nurse.  Any transcriptional errors are unintentional.  Orders Placed This Encounter  Procedures   Basic Metabolic Panel  TSH   Parathyroid hormone, intact (no Ca)   VITAMIN D 25 Hydroxy (Vit-D Deficiency, Fractures)   POCT glycosylated hemoglobin (Hb A1C)   POCT glucose (manual entry)     Requested Prescriptions    No prescriptions requested or ordered in this encounter    Return in about 4 months (around 07/13/2023).  Jonah Blue, MD, FACP

## 2023-03-17 LAB — PARATHYROID HORMONE, INTACT (NO CA): PTH: 57 pg/mL (ref 15–65)

## 2023-03-17 LAB — BASIC METABOLIC PANEL
BUN/Creatinine Ratio: 15 (ref 12–28)
BUN: 10 mg/dL (ref 8–27)
CO2: 24 mmol/L (ref 20–29)
Calcium: 12.2 mg/dL — ABNORMAL HIGH (ref 8.7–10.3)
Chloride: 101 mmol/L (ref 96–106)
Creatinine, Ser: 0.65 mg/dL (ref 0.57–1.00)
Glucose: 87 mg/dL (ref 70–99)
Potassium: 4 mmol/L (ref 3.5–5.2)
Sodium: 140 mmol/L (ref 134–144)
eGFR: 96 mL/min/{1.73_m2} (ref >59)

## 2023-03-17 LAB — VITAMIN D 25 HYDROXY (VIT D DEFICIENCY, FRACTURES): Vit D, 25-Hydroxy: 51.1 ng/mL (ref 30.0–100.0)

## 2023-03-17 LAB — TSH: TSH: 0.498 u[IU]/mL (ref 0.450–4.500)

## 2023-03-18 ENCOUNTER — Ambulatory Visit: Payer: Self-pay | Admitting: Surgery

## 2023-03-18 DIAGNOSIS — C50912 Malignant neoplasm of unspecified site of left female breast: Secondary | ICD-10-CM

## 2023-03-19 NOTE — Progress Notes (Unsigned)
 [facility] CONSULT NOTE  Patient Care Team: Marcine Matar, MD as PCP - General (Internal Medicine) [facility] (Ophthalmology) Donnelly Angelica, RN as Oncology Nurse Navigator Lu Duffel, Margretta Ditty, RN as Oncology Nurse Navigator Manus Rudd, MD as Consulting Physician (General Surgery) Rachel Moulds, MD as Consulting Physician (Hematology and Oncology) Dorothy Puffer, MD as Consulting Physician (Radiation Oncology)  CHIEF COMPLAINTS/PURPOSE OF CONSULTATION:  Newly diagnosed breast cancer  HISTORY OF PRESENTING ILLNESS:  [name] 68 y.o. female is here because of recent diagnosis of left breast cancer  I reviewed her records extensively and collaborated the history with the patient.  SUMMARY OF ONCOLOGIC HISTORY: Oncology History  Invasive ductal carcinoma of breast, left (HCC)  02/26/2023 Mammogram   Possible distortion in the left breast, diagnostic mammogram and Possibly [place] of left breast.  Suspicious 7 mm mass in the 6 o'clock region of the left breast 3 cm from the nipple.   Indeterminate left axillary lymph node with focal cortical thickening.   03/12/2023 Pathology Results   Left breast needle core biopsy showed IDC, DCIS, overall grade 1, Left axillary LN neg for metastatic carcinoma. Estrogen Receptor:  100%, POSITIVE, STRONG STAINING INTENSITY Progesterone Receptor:  95%, POSITIVE, STRONG STAINING INTENSITY Proliferation Marker Ki67:  2%  Her 2 0-1+   03/13/2023 Initial Diagnosis   Invasive ductal carcinoma of breast, left [facility])    Pathology Results      This is a pleasant 68 year old female patient with past medical history significant for type 2 diabetes mellitus, hypertension, COPD came in a wheelchair by herself with new diagnosis of left breast cancer.  She lives by herself, appears to be frail, walks with a cane but complains of some severe joint pains which limits her activity as well.  She denies any prior abnormal  mammograms or palpable masses.  She reports some pain at the site of the breast biopsy.  No family history of breast cancer.  She has 3 kids, history of breast-feeding.  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  Rest of the pertinent 10 point ROS reviewed and negative  MEDICAL HISTORY:  Past Medical History:  Diagnosis Date   Anxiety    Asthma    Breast cancer (HCC)    Chest pain    COPD (chronic obstructive pulmonary disease) (HCC)    Depression    DM type 2 (diabetes mellitus, type 2) (HCC)    Hypertension    Pneumonia    Shortness of breath dyspnea     SURGICAL HISTORY: Past Surgical History:  Procedure Laterality Date   BREAST BIOPSY Left 03/11/2023   [place] LT BREAST BX W LOC DEV 1ST LESION IMG BX SPEC US GUIDE 03/11/2023 [facility] MAMMOGRAPHY   TEE WITHOUT CARDIOVERSION N/A 08/24/2014   Procedure: TRANSESOPHAGEAL ECHOCARDIOGRAM (TEE);  Surgeon: Yates Decamp, MD;  Location: [facility] ENDOSCOPY;  Service: Cardiovascular;  Laterality: N/A;   TOTAL ABDOMINAL HYSTERECTOMY  01/15/2005    SOCIAL HISTORY: Social History   Socioeconomic History   Marital status: Single    Spouse name: Not on file   Number of children: Not on file   Years of education: Not on file   Highest education level: Not on file  Occupational History   Not on file  Tobacco Use   Smoking status: Former    Current packs/day: 0.00    Average packs/day: 1 pack/day for 38.0 years (38.0 ttl pk-yrs)    Types: Cigarettes  Start date: 01/15/1970    Quit date: 01/16/2008    Years since quitting: 15.1   Smokeless tobacco: Never  Vaping Use   Vaping status: Never Used  Substance and Sexual Activity   Alcohol use: No    Alcohol/week: 0.0 standard drinks of alcohol   Drug use: No   Sexual activity: Not on file  Other Topics Concern   Not on file  Social History Narrative   Not on file   Social Drivers of Health   Financial Resource Strain: Low Risk  (12/31/2022)   Overall Financial  Resource Strain (CARDIA)    Difficulty of Paying Living Expenses: Not very hard  Food Insecurity: No Food Insecurity (12/31/2022)   Hunger Vital Sign    Worried About Running Out of Food in the Last Year: Never true    Ran Out of Food in the Last Year: Never true  Transportation Needs: No Transportation Needs (12/31/2022)   PRAPARE - Administrator, Civil Service (Medical): No    Lack of Transportation (Non-Medical): No  Physical Activity: Inactive (12/31/2022)   Exercise Vital Sign    Days of Exercise per Week: 0 days    Minutes of Exercise per Session: 0 min  Stress: No Stress Concern Present (12/31/2022)   [employer] of Occupational Health - Occupational Stress Questionnaire    Feeling of Stress : Not at all  Social Connections: Socially Isolated (12/31/2022)   Social Connection and Isolation Panel [NHANES]    Frequency of Communication with Friends and Family: Once a week    Frequency of Social Gatherings with Friends and Family: Never    Attends Religious Services: Never    Database administrator or Organizations: No    Attends Banker Meetings: Never    Marital Status: Divorced  Catering manager Violence: Not At Risk (12/31/2022)   Humiliation, Afraid, Rape, and Kick questionnaire    Fear of Current or Ex-Partner: No    Emotionally Abused: No    Physically Abused: No    Sexually Abused: No    FAMILY HISTORY: Family History  Problem Relation Age of Onset   Allergies Mother    Allergies Daughter    Asthma Son        had as a child   BRCA 1/2 Neg Hx    Breast cancer Neg Hx     ALLERGIES:  is allergic to flonase [fluticasone propionate].  MEDICATIONS:  Current Outpatient Medications  Medication Sig Dispense Refill   albuterol (PROAIR HFA) 108 (90 Base) MCG/ACT inhaler Inhale 1-2 puffs into the lungs every 6 (six) hours as needed for wheezing or shortness of breath. 18 g 2   amLODipine (NORVASC) 10 MG tablet Take 1 tablet (10 mg total)  by mouth daily. 100 tablet 1   aspirin 81 MG tablet Take 81 mg by mouth daily.     atorvastatin (LIPITOR) 80 MG tablet Take 1 tablet (80 mg total) by mouth daily. 90 tablet 1   Camphor-Eucalyptus-Menthol (VICKS VAPORUB EX) Apply 1 application topically 2 (two) times daily as needed (breathe and soreness).     carvedilol (COREG) 12.5 MG tablet Take 1 tablet (12.5 mg total) by mouth 2 (two) times daily with a meal. 180 tablet 3   Continuous Glucose Receiver (FREESTYLE LIBRE 2 READER) DEVI Use to check glucose continuously throughout the day. E11.69 1 each 0   Continuous Glucose Sensor (FREESTYLE LIBRE 2 SENSOR) MISC Place 1 sensor on the skin every 14 days. Use to  check glucose continuously. E11.69 2 each 6   esomeprazole (NEXIUM) 40 MG capsule Take 1 capsule (40 mg total) by mouth daily. 90 capsule 3   FLUoxetine (PROZAC) 40 MG capsule Take 1 capsule (40 mg total) by mouth daily. 90 capsule 0   Fluticasone-Umeclidin-Vilant (TRELEGY ELLIPTA) 100-62.5-25 MCG/ACT AEPB Take 1 puff by mouth daily. 180 each 1   linaclotide (LINZESS) 145 MCG CAPS capsule Take 1 capsule (145 mcg total) by mouth daily before breakfast. 100 capsule 1   lisinopril-hydrochlorothiazide (ZESTORETIC) 20-12.5 MG tablet Take 1 tablet by mouth daily. 90 tablet 1   metFORMIN (GLUCOPHAGE-XR) 500 MG 24 hr tablet TAKE 1 TABLET BY MOUTH TWICE  DAILY WITH MEALS 180 tablet 1   montelukast (SINGULAIR) 10 MG tablet Take 1 tablet (10 mg total) by mouth at bedtime. 90 tablet 3   Naproxen Sodium (ALEVE PO) Take 1 tablet by mouth at bedtime as needed.     nitroGLYCERIN (NITROSTAT) 0.3 MG SL tablet DISSOLVE 1 TABLET UNDER THE  TONGUE EVERY 5 MINUTES AS NEEDED FOR CHEST PAIN. MAX OF 3 TABLETS IN 15 MINUTES. CALL 911 IF PAIN  PERSISTS. 100 tablet 3   Semaglutide, 1 MG/DOSE, 4 MG/3ML SOPN Inject 1 mg as directed once a week. 9 mL 1   tiZANidine (ZANAFLEX) 2 MG tablet Take 2 mg by mouth every 6 (six) hours as needed.     traZODone (DESYREL) 50 MG  tablet Take 1 tablet (50 mg total) by mouth at bedtime as needed. 90 tablet 3   No current facility-administered medications for this visit.    REVIEW OF SYSTEMS:   Constitutional: Denies fevers, chills or abnormal night sweats Eyes: Denies blurriness of vision, double vision or watery eyes Ears, nose, mouth, throat, and face: Denies mucositis or sore throat Respiratory: Denies cough, dyspnea or wheezes Cardiovascular: Denies palpitation, chest discomfort or lower extremity swelling Gastrointestinal:  Denies nausea, heartburn or change in bowel habits Skin: Denies abnormal skin rashes Lymphatics: Denies new lymphadenopathy or easy bruising Neurological:Denies numbness, tingling or new weaknesses Behavioral/Psych: Mood is stable, no new changes  Breast: Denies any palpable lumps or discharge All other systems were reviewed with the patient and are negative.  PHYSICAL EXAMINATION: ECOG PERFORMANCE STATUS: 2 - Symptomatic, <50% confined to bed  Vitals:   03/20/23 0831  BP: 105/66  Pulse: 87  Resp: 16  Temp: (!) 97.4 F (36.3 C)  SpO2: 97%   Filed Weights   03/20/23 0831  Weight: 185 lb 4.8 oz (84.1 kg)    GENERAL:alert, no distress and comfortable Neck: No palpable adenopathy Left breast postbiopsy changes noted with some bruising.  Palpable left axillary lymphadenopathy which was deemed to be reactive. Chest: Clear to auscultation bilaterally No lower extremity edema.  Overall she appears frail, wheelchair and has limited mobility and lives alone. LABORATORY DATA:  I have reviewed the data as listed Lab Results  Component Value Date   WBC 5.7 01/29/2022   HGB 14.9 01/29/2022   HCT 45.2 (H) 01/29/2022   MCV 90.8 01/29/2022   PLT 237 01/29/2022   Lab Results  Component Value Date   NA 140 03/15/2023   K 4.0 03/15/2023   CL 101 03/15/2023   CO2 24 03/15/2023    RADIOGRAPHIC STUDIES: I have personally reviewed the radiological reports and agreed with the findings  in the report.  ASSESSMENT AND PLAN:  Invasive ductal carcinoma of breast, left (HCC) This is a very pleasant 68 year old patient with newly diagnosed left breast cancer, ER positive  PR positive HER2 negative, grade 1, low Ki67 of 2% referred to breast MDC for additional recommendations.  Breast Cancer Given early stage, node-negative, ER/PR positive and low-grade tumor, we have discussed about proceeding with upfront surgery, lumpectomy.  We have deferred Oncotype testing, she is frail and she will not be a candidate for adjuvant chemotherapy.  Moreover I do not believe she will need adjuvant chemotherapy given the low-grade, strong ER/PR positivity and low proliferation index.  Hence we have discussed about proceeding with adjuvant radiation after surgery followed by antiestrogen therapy.  I reviewed the mechanism of action of antiestrogen therapy, possible adverse effects including but not limited to hot flashes, vaginal dryness, bone density loss.  She will be a good candidate to attempt aromatase inhibitors, tamoxifen is not preferred given sedentary habitus and high risk for DVT/PE.  Will monitor bone density while she is on aromatase inhibitors.  She understands the duration of antiestrogen therapy which may be a minimum of 5 years.  Arthritis Pain in the leg, affecting mobility. Uses a wheelchair for transportation. -Continue current management.  Hypertension and Diabetes Continue current management.  Follow-up After completion of radiation therapy to start anti-estrogen therapy. He is agreeable to these recommendations.  All her questions were answered to the best my knowledge.  Thank you for consulting [place] in the care of this patient.  Please do not hesitate to contact us with any additional questions or concerns   All questions were answered. The patient knows to call the clinic with any problems, questions or concerns.    Rachel Moulds, MD 03/20/23

## 2023-03-20 ENCOUNTER — Encounter: Payer: Self-pay | Admitting: Hematology and Oncology

## 2023-03-20 ENCOUNTER — Ambulatory Visit: Payer: Self-pay | Admitting: Surgery

## 2023-03-20 ENCOUNTER — Ambulatory Visit
Admission: RE | Admit: 2023-03-20 | Discharge: 2023-03-20 | Disposition: A | Discharge status: Home / Self Care | Payer: 59 | Source: Ambulatory Visit | Attending: Radiation Oncology | Admitting: Radiation Oncology

## 2023-03-20 ENCOUNTER — Ambulatory Visit: Admitting: Physical Therapy

## 2023-03-20 ENCOUNTER — Inpatient Hospital Stay: Attending: Hematology and Oncology | Admitting: Hematology and Oncology

## 2023-03-20 ENCOUNTER — Inpatient Hospital Stay: Admitting: Licensed Clinical Social Worker

## 2023-03-20 ENCOUNTER — Encounter: Payer: Self-pay | Admitting: *Deleted

## 2023-03-20 ENCOUNTER — Inpatient Hospital Stay

## 2023-03-20 ENCOUNTER — Encounter: Payer: Self-pay | Admitting: Genetic Counselor

## 2023-03-20 VITALS — BP 105/66 | HR 87 | Temp 97.4°F | Resp 16 | Ht 67.01 in | Wt 185.3 lb

## 2023-03-20 DIAGNOSIS — E119 Type 2 diabetes mellitus without complications: Secondary | ICD-10-CM | POA: Diagnosis not present

## 2023-03-20 DIAGNOSIS — C50812 Malignant neoplasm of overlapping sites of left female breast: Secondary | ICD-10-CM | POA: Insufficient documentation

## 2023-03-20 DIAGNOSIS — C50912 Malignant neoplasm of unspecified site of left female breast: Secondary | ICD-10-CM

## 2023-03-20 DIAGNOSIS — I10 Essential (primary) hypertension: Secondary | ICD-10-CM | POA: Diagnosis not present

## 2023-03-20 DIAGNOSIS — Z170 Estrogen receptor positive status [ER+]: Secondary | ICD-10-CM | POA: Diagnosis not present

## 2023-03-20 DIAGNOSIS — Z7985 Long-term (current) use of injectable non-insulin antidiabetic drugs: Secondary | ICD-10-CM | POA: Diagnosis not present

## 2023-03-20 DIAGNOSIS — C50512 Malignant neoplasm of lower-outer quadrant of left female breast: Secondary | ICD-10-CM | POA: Diagnosis not present

## 2023-03-20 NOTE — H&P (Signed)
 Subjective   Chief Complaint: Breast Cancer     History of Present Illness: [name] is a 68 y.o. female who is seen today as an office consultation at the request of Dr. Al Pimple for evaluation of Breast Cancer .     This is a 68 year old female with DM2, COPD, HTN, asthma who presented after a recent screening mammogram revealed a 7 x 8 x 6 mm mass at 6:00 in the left breast 3 cmfn.  Axillary ultrasound revealed a mildly thickened lymph node.  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  Biopsy of the axillary lymph node was negative.  No family history of breast cancer.  No previous breast problems or breast surgery.  Medical History: Past Medical History:  Diagnosis Date   Anxiety    Asthma, unspecified asthma severity, unspecified whether complicated, unspecified whether persistent (HHS-HCC)    COPD (chronic obstructive pulmonary disease) (CMS/HHS-HCC)    Diabetes mellitus without complication (CMS/HHS-HCC)    GERD (gastroesophageal reflux disease)    History of cancer    Hyperlipidemia    Hypertension     Patient Active Problem List  Diagnosis   DM type 2 (diabetes mellitus, type 2) (CMS/HHS-HCC)   Invasive ductal carcinoma of breast, left (CMS/HHS-HCC)   Malignant neoplasm of lower-outer quadrant of left breast of female, estrogen receptor positive (CMS/HHS-HCC)   Mixed hyperlipidemia    Past Surgical History:  Procedure Laterality Date   Tee without cardioversion N/A 08/24/2014   .Left Breast Biopsy Left 03/11/2023   HYSTERECTOMY     Total abdominal hysterectomy 2007     Allergies  Allergen Reactions   Flonase [Fluticasone] Other (See Comments)    Nose bleed    Prior to Admission medications   Medication Sig Taking? Last Dose  amLODIPine (NORVASC) 10 MG tablet Take 1 tablet by mouth once daily Yes   atorvastatin (LIPITOR) 80 MG tablet Take 80 mg by mouth once daily Yes   carvediloL (COREG) 12.5 MG tablet  Take 12.5 mg by mouth 2 (two) times daily with meals Yes   esomeprazole (NEXIUM) 40 MG DR capsule Take 40 mg by mouth once daily Yes   FLUoxetine (PROZAC) 40 MG capsule Take 40 mg by mouth once daily Yes   linaCLOtide (LINZESS) 145 mcg capsule Take 145 mcg by mouth every morning before breakfast Yes   lisinopriL-hydroCHLOROthiazide (ZESTORETIC) 20-12.5 mg tablet Take 1 tablet by mouth once daily Yes   metFORMIN (GLUCOPHAGE-XR) 500 MG XR tablet Take 1 tablet by mouth 2 (two) times daily with meals Yes   montelukast (SINGULAIR) 10 mg tablet Take 10 mg by mouth at bedtime Yes   nitroGLYcerin (NITROSTAT) 0.3 MG SL tablet Place 0.3 mg under the tongue every 5 (five) minutes as needed for Chest pain DISSOLVE 1 TABLET UNDER THE TONGUE EVERY 5 MINUTES AS NEEDED FOR CHEST PAIN. MAX OF 3 TABLETS IN 15 MINUTES. CALL 911 IF PAIN PERSISTS. Yes   semaglutide (OZEMPIC) 1 mg/dose (4 mg/3 mL) pen injector Inject 1 mg as directed once a week Yes   traZODone (DESYREL) 50 MG tablet Take 50 mg by mouth at bedtime as needed for Sleep Yes   TRELEGY ELLIPTA 100-62.5-25 mcg inhaler Take 1 Puff by mouth once daily Yes   aspirin 81 MG EC tablet Take 81 mg by mouth once daily        Family History  Problem Relation Age of Onset   Diabetes Mother  Deep vein thrombosis (DVT or abnormal blood clot formation) Sister    Stroke Daughter    High blood pressure (Hypertension) Daughter    Hyperlipidemia (Elevated cholesterol) Daughter    Diabetes Daughter    Deep vein thrombosis (DVT or abnormal blood clot formation) Daughter      Social History   Tobacco Use  Smoking Status Former   Types: Cigarettes  Smokeless Tobacco Never  Tobacco Comments   Quit cigarettes 2010     Social History   Socioeconomic History   Marital status: Single  Tobacco Use   Smoking status: Former    Types: Cigarettes   Smokeless tobacco: Never   Tobacco comments:    Quit cigarettes 2010  Vaping Use   Vaping status: Never Used   Substance and Sexual Activity   Alcohol use: Not Currently   Drug use: Never   Social Drivers of Corporate investment banker Strain: Low Risk  (12/31/2022)   Received from [facility]   Overall Financial Resource Strain (CARDIA)    Difficulty of Paying Living Expenses: Not very hard  Food Insecurity: No Food Insecurity (12/31/2022)   Received from [facility]   Hunger Vital Sign    Worried About Running Out of Food in the Last Year: Never true    Ran Out of Food in the Last Year: Never true  Transportation Needs: No Transportation Needs (12/31/2022)   Received from [facility] - Transportation    Lack of Transportation (Medical): No    Lack of Transportation (Non-Medical): No  Physical Activity: Inactive (12/31/2022)   [facility] from [facility]   Exercise Vital Sign    Days of Exercise per Week: 0 days    Minutes of Exercise per Session: 0 min  Stress: No Stress Concern Present (12/31/2022)   Received from [facility] of Occupational Health - Occupational Stress Questionnaire    Feeling of Stress : Not at all  Social Connections: Socially Isolated (12/31/2022)   Received from [facility]   Social Connection and Isolation Panel [NHANES]    Frequency of Communication with Friends and Family: Once a week    Frequency of Social Gatherings with Friends and Family: Never    Attends Religious Services: Never    Database Rivera or Organizations: No    Attends Banker Meetings: Never    Marital Status: Divorced  Housing Stability: Low Risk  (12/31/2022)   Received from [facility]   Housing Stability Vital Sign    Unable to Pay for Housing in the Last Year: No    Number of Times Moved in the Last Year: 0    Homeless in the Last Year: No    Objective:    Physical Exam   Constitutional:  WDWN in NAD, conversant, no obvious deformities; seated in wheelchair Eyes:  Pupils equal, round; sclera anicteric; moist conjunctiva; no  lid lag HENT:  Oral mucosa moist; good dentition  Neck:  No masses palpated, trachea midline; no thyromegaly Lungs:  CTA bilaterally; normal respiratory effort Breasts:  symmetric, no nipple changes; no palpable masses or lymphadenopathy on either side; slight bruising in central/ upper left breast after biopsy CV:  Regular rate and rhythm; no murmurs; extremities well-perfused with no edema Abd:  +bowel sounds, soft, non-tender, no palpable organomegaly; no palpable hernias Musc:  Unable to assess gait; no apparent clubbing or cyanosis in extremities Lymphatic:  No palpable cervical or axillary lymphadenopathy Skin:  Warm, dry;  no sign of jaundice Psychiatric - alert and oriented x 4; calm mood and affect   Labs, Imaging and Diagnostic Testing:  FINAL DIAGNOSIS       1. Breast, left, needle core biopsy, left breast mass 6:00, 3cmfn (ribbon clip) :      INVASIVE DUCTAL CARCINOMA, SEE NOTE      DUCTAL CARCINOMA IN SITU, CRIBRIFORM, LOW NUCLEAR GRADE, WITHOUT NECROSIS      TUBULE FORMATION: SCORE 2      NUCLEAR PLEOMORPHISM: SCORE 2      MITOTIC COUNT: SCORE 1      TOTAL SCORE: 5      OVERALL GRADE: 1      LYMPHOVASCULAR INVASION: NOT IDENTIFIED      CANCER LENGTH: 1 CM      CALCIFICATIONS: NOT IDENTIFIED      OTHER FINDINGS: NONE      SEE NOTE       2. Breast, left, needle core biopsy, left axillary lymph node (hydromark clip) :      REACTIVE LYMPH NODE NEGATIVE FOR METASTATIC CARCINOMA       Diagnosis Note : Dr.Lu reviewed the case and concurs with the interpretation.A      breast prognostic profile (ER, PR, Ki-67 and HER2) is pending and will be      reported in an addendum.[facility] was notified on 03/12/2023.      ELECTRONIC SIGNATURE : Jacelyn Grip, John, Pathologist, Electronic Signature  ADDENDUM Breast, left, needle core biopsy, left breast mass 6:00, 3cmfn (ribbon clip) PROGNOSTIC INDICATORS  Results: IMMUNOHISTOCHEMICAL AND MORPHOMETRIC  ANALYSIS PERFORMED MANUALLY The tumor cells are NEGATIVE for Her2 (0-1+). Estrogen Receptor:  100%, POSITIVE, STRONG STAINING INTENSITY Progesterone Receptor:  95%, POSITIVE, STRONG STAINING INTENSITY Proliferation Marker Ki67:  2% REFERENCE RANGE ESTROGEN RECEPTOR NEGATIVE     0% POSITIVE       =>1% REFERENCE RANGE PROGESTERONE RECEPTOR NEGATIVE     0% POSITIVE        =>1% All controls stained appropriately [name], [name] , [name], Electronic Signature ( Signed [phone] [id number] [id number])   CLINICAL DATA:  Patient was recalled from screening mammogram for possible distortion.   EXAM: DIGITAL DIAGNOSTIC UNILATERAL LEFT MAMMOGRAM WITH TOMOSYNTHESIS AND CAD; ULTRASOUND LEFT BREAST LIMITED   TECHNIQUE: Left digital diagnostic mammography and breast tomosynthesis was performed. The images were evaluated with computer-aided detection. ; Targeted ultrasound examination of the left breast was performed.   COMPARISON:  Previous exam(s).   ACR Breast Density Category b: There are scattered areas of fibroglandular density.   FINDINGS: Additional imaging of the left breast was performed. There is persistence of a spiculated mass in the 6 o'clock region of the breast. No additional abnormality is seen in the left breast.   Targeted ultrasound is performed, showing an irregular hypoechoic mass with angular margins in the 6 o'clock region of the left breast 3 cm from the nipple measuring 7 x 8 x 6 mm. Sonographic evaluation of the left axilla shows a lymph node with cortical thickening measuring 3 mm.   IMPRESSION: Suspicious 7 mm mass in the 6 o'clock region of the left breast 3 cm from the nipple.   Indeterminate left axillary lymph node with focal cortical thickening.   RECOMMENDATION: Ultrasound-guided core biopsy of the mass in the 6 o'clock region of the left breast as well as the indeterminate axillary lymph node is recommended.   I have discussed the findings and  recommendations with the patient. If applicable, a reminder  letter will be sent to the patient regarding the next appointment.   BI-RADS CATEGORY  5: Highly suggestive of malignancy.     Electronically Signed   By: Baird Lyons M.D.   On: 03/01/2023 16:37  Assessment and Plan:  Diagnoses and all orders for this visit:  Malignant neoplasm of lower-outer quadrant of left breast of female, estrogen receptor positive (CMS/HHS-HCC)    After a thorough multidisciplinary discussion, and after a lengthy discussion with the patient, we presented her treatment options.  We discussed mastectomy vs. Breast conserving therapy.  She has multiple medical comorbidities and limited mobility.  After discussion, she is opting for breast conserving therapy.  We will proceed with left breast radioactive seed localized lumpectomy.  Based on current studies, there is no indication for sentinel lymph node biopsy.  Surgery will be followed by possible radiation and anti-estrogens.  Challis Crill Delbert Harness, Rivera  03/20/2023 9:35 AM

## 2023-03-20 NOTE — Assessment & Plan Note (Signed)
 This is a very pleasant 68 year old patient with newly diagnosed left breast cancer, ER positive PR positive HER2 negative, grade 1, low Ki67 of 2% referred to breast MDC for additional recommendations.  Breast Cancer Given early stage, node-negative, ER/PR positive and low-grade tumor, we have discussed about proceeding with upfront surgery, lumpectomy.  We have deferred Oncotype testing, she is frail and she will not be a candidate for adjuvant chemotherapy.  Moreover I do not believe she will need adjuvant chemotherapy given the low-grade, strong ER/PR positivity and low proliferation index.  Hence we have discussed about proceeding with adjuvant radiation after surgery followed by antiestrogen therapy.  I reviewed the mechanism of action of antiestrogen therapy, possible adverse effects including but not limited to hot flashes, vaginal dryness, bone density loss.  She will be a good candidate to attempt aromatase inhibitors, tamoxifen is not preferred given sedentary habitus and high risk for DVT/PE.  Will monitor bone density while she is on aromatase inhibitors.  She understands the duration of antiestrogen therapy which may be a minimum of 5 years.  Arthritis Pain in the leg, affecting mobility. Uses a wheelchair for transportation. -Continue current management.  Hypertension and Diabetes Continue current management.  Follow-up After completion of radiation therapy to start anti-estrogen therapy. He is agreeable to these recommendations.  All her questions were answered to the best my knowledge.  Thank you for consulting [place] in the care of this patient.  Please do not hesitate to contact us with any additional questions or concerns

## 2023-03-20 NOTE — Progress Notes (Addendum)
 Radiation Oncology         ([phone]) [phone] ________________________________  Name: [name]        MRN: [id number]  Date of Service: 03/20/2023 DOB: August 05, 1955  WU:[id number], Binnie Rail, MD  Manus Rudd, MD     REFERRING PHYSICIAN: Manus Rudd, MD   DIAGNOSIS: The encounter diagnosis was Malignant neoplasm of lower-outer quadrant of left breast of female, estrogen receptor positive (HCC).   Cancer Staging  Malignant neoplasm of lower-outer quadrant of left breast of female, estrogen receptor positive (HCC) Staging form: Breast, AJCC 8th Edition - Clinical stage from 03/20/2023: Stage IA (cT1b, cN0, cM0, G1, ER+, PR+, HER2-) - Unsigned Stage prefix: Initial diagnosis Histologic grading system: 3 grade system  Stage IA (cT1b, cN0, M0) low grade invasive ductal carcinoma; ER/PR+, HER2-  HISTORY OF PRESENT ILLNESS: [name] is a 68 y.o. female presenting to our breast clinic for a newly diagnosed left breast cancer. She presented with an asymmetry detected on screening mammogram. She proceeded with diagnostic mammogram and ultrasound on 03/01/2023 that showed a suspicious 0.7 cm mass in the 6:00 position 3 cmfn. An indeterminate left axillary lymph node was also noted. Patient proceeded with a biopsy of the left breast mass that revealed low grade invasive ductal carcinoma that was ER and PR positive and HER2 negative with a Ki-67 2%. The biopsy of the abnormal left axillary lymph node showed no evidence for metastatic carcinoma.   She is seen today to discuss treatment recommendations for her newly diagnosed breast cancer.    PREVIOUS RADIATION THERAPY: No   PAST MEDICAL HISTORY:  Past Medical History:  Diagnosis Date   Anxiety    Asthma    Breast cancer (HCC)    Chest pain    COPD (chronic obstructive pulmonary disease) (HCC)    Depression    DM type 2 (diabetes mellitus, type 2) (HCC)    Hypertension    Pneumonia    Shortness of breath dyspnea        PAST SURGICAL  HISTORY: Past Surgical History:  Procedure Laterality Date   BREAST BIOPSY Left 03/11/2023   [place] LT BREAST BX W LOC DEV 1ST LESION IMG BX SPEC US GUIDE 03/11/2023 [facility] MAMMOGRAPHY   TEE WITHOUT CARDIOVERSION N/A 08/24/2014   Procedure: TRANSESOPHAGEAL ECHOCARDIOGRAM (TEE);  Surgeon: Yates Decamp, MD;  Location: [facility] ENDOSCOPY;  Service: Cardiovascular;  Laterality: N/A;   TOTAL ABDOMINAL HYSTERECTOMY  01/15/2005     FAMILY HISTORY:  Family History  Problem Relation Age of Onset   Allergies Mother    Allergies Daughter    Asthma Son        had as a child   BRCA 1/2 Neg Hx    Breast cancer Neg Hx      SOCIAL HISTORY:  reports that she quit smoking about 15 years ago. Her smoking use included cigarettes. She started smoking about 53 years ago. She has a 38 pack-year smoking history. She has never used smokeless tobacco. She reports that she does not drink alcohol and does not use drugs.   ALLERGIES: Flonase [fluticasone propionate]   MEDICATIONS:  Current Outpatient Medications  Medication Sig Dispense Refill   albuterol (PROAIR HFA) 108 (90 Base) MCG/ACT inhaler Inhale 1-2 puffs into the lungs every 6 (six) hours as needed for wheezing or shortness of breath. 18 g 2   amLODipine (NORVASC) 10 MG tablet Take 1 tablet (10 mg total) by mouth daily. 100 tablet 1   aspirin 81 MG tablet Take  81 mg by mouth daily.     atorvastatin (LIPITOR) 80 MG tablet Take 1 tablet (80 mg total) by mouth daily. 90 tablet 1   Camphor-Eucalyptus-Menthol (VICKS VAPORUB EX) Apply 1 application topically 2 (two) times daily as needed (breathe and soreness).     carvedilol (COREG) 12.5 MG tablet Take 1 tablet (12.5 mg total) by mouth 2 (two) times daily with a meal. 180 tablet 3   Continuous Glucose Receiver (FREESTYLE LIBRE 2 READER) DEVI Use to check glucose continuously throughout the day. E11.69 1 each 0   Continuous Glucose Sensor (FREESTYLE LIBRE 2 SENSOR) MISC Place 1 sensor on the skin every 14 days. Use  to check glucose continuously. E11.69 2 each 6   esomeprazole (NEXIUM) 40 MG capsule Take 1 capsule (40 mg total) by mouth daily. 90 capsule 3   FLUoxetine (PROZAC) 40 MG capsule Take 1 capsule (40 mg total) by mouth daily. 90 capsule 0   Fluticasone-Umeclidin-Vilant (TRELEGY ELLIPTA) 100-62.5-25 MCG/ACT AEPB Take 1 puff by mouth daily. 180 each 1   linaclotide (LINZESS) 145 MCG CAPS capsule Take 1 capsule (145 mcg total) by mouth daily before breakfast. 100 capsule 1   lisinopril-hydrochlorothiazide (ZESTORETIC) 20-12.5 MG tablet Take 1 tablet by mouth daily. 90 tablet 1   metFORMIN (GLUCOPHAGE-XR) 500 MG 24 hr tablet TAKE 1 TABLET BY MOUTH TWICE  DAILY WITH MEALS 180 tablet 1   montelukast (SINGULAIR) 10 MG tablet Take 1 tablet (10 mg total) by mouth at bedtime. 90 tablet 3   Naproxen Sodium (ALEVE PO) Take 1 tablet by mouth at bedtime as needed.     nitroGLYCERIN (NITROSTAT) 0.3 MG SL tablet DISSOLVE 1 TABLET UNDER THE  TONGUE EVERY 5 MINUTES AS NEEDED FOR CHEST PAIN. MAX OF 3 TABLETS IN 15 MINUTES. CALL 911 IF PAIN  PERSISTS. 100 tablet 3   Semaglutide, 1 MG/DOSE, 4 MG/3ML SOPN Inject 1 mg as directed once a week. 9 mL 1   tiZANidine (ZANAFLEX) 2 MG tablet Take 2 mg by mouth every 6 (six) hours as needed.     traZODone (DESYREL) 50 MG tablet Take 1 tablet (50 mg total) by mouth at bedtime as needed. 90 tablet 3   No current facility-administered medications for this encounter.     REVIEW OF SYSTEMS: On review of systems, the patient reports that she is doing well overall. She denies any breast specific complaints     PHYSICAL EXAM:  Wt Readings from Last 3 Encounters:  03/20/23 185 lb 4.8 oz (84.1 kg)  03/15/23 189 lb (85.7 kg)  12/31/22 210 lb (95.3 kg)   Temp Readings from Last 3 Encounters:  03/20/23 (!) 97.4 F (36.3 C) (Temporal)  03/15/23 97.8 F (36.6 C) (Oral)  12/31/22 98.3 F (36.8 C) (Oral)   BP Readings from Last 3 Encounters:  03/20/23 105/66  03/15/23 105/71   12/31/22 (!) 140/80   Pulse Readings from Last 3 Encounters:  03/20/23 87  03/15/23 82  12/31/22 79    /10  In general this is a well appearing female in no acute distress. She's alert and oriented x4 and appropriate throughout the examination. Cardiopulmonary assessment is negative for acute distress and she exhibits normal effort.     ECOG = 0  0 - Asymptomatic (Fully active, able to carry on all predisease activities without restriction)  1 - Symptomatic but completely ambulatory (Restricted in physically strenuous activity but ambulatory and able to carry out work of a light or sedentary nature. For example, light housework,  office work)  2 - Symptomatic, <50% in bed during the day (Ambulatory and capable of all self care but unable to carry out any work activities. Up and about more than 50% of waking hours)  3 - Symptomatic, >50% in bed, but not bedbound (Capable of only limited self-care, confined to bed or chair 50% or more of waking hours)  4 - Bedbound (Completely disabled. Cannot carry on any self-care. Totally confined to bed or chair)  5 - Death   Santiago Glad MM, Creech RH, Tormey DC, et al. [phone]). "Toxicity and response criteria of the [facility]". Am. Evlyn Clines. Oncol. 5 (6): 649-55    LABORATORY DATA:  Lab Results  Component Value Date   WBC 5.7 01/29/2022   HGB 14.9 01/29/2022   HCT 45.2 (H) 01/29/2022   MCV 90.8 01/29/2022   PLT 237 01/29/2022   Lab Results  Component Value Date   NA 140 03/15/2023   K 4.0 03/15/2023   CL 101 03/15/2023   CO2 24 03/15/2023   Lab Results  Component Value Date   ALT 22 11/15/2022   AST 17 11/15/2022   ALKPHOS 61 11/15/2022   BILITOT 0.4 11/15/2022      RADIOGRAPHY: [place] LT BREAST BX W LOC DEV 1ST LESION IMG BX SPEC US GUIDE Addendum Date: 03/12/2023 ADDENDUM REPORT: 03/12/2023 14:35 ADDENDUM: Pathology revealed GRADE I INVASIVE DUCTAL CARCINOMA, DUCTAL CARCINOMA IN SITU, CRIBRIFORM, LOW NUCLEAR  GRADE, WITHOUT NECROSIS of the LEFT breast, 6:00 o'clock, 3cmfn (ribbon clip). This was found to be concordant by Dr. Frederico Hamman. Pathology revealed REACTIVE LYMPH NODE NEGATIVE FOR METASTATIC CARCINOMA of the LEFT axilla, (hydromark clip). This was found to be concordant by Dr. Frederico Hamman. Pathology results were discussed with the patient by telephone. The patient reported doing well after the biopsies with tenderness at the sites. Post biopsy instructions and care were reviewed and questions were answered. The patient was encouraged to call [facility] for any additional concerns. My direct phone number was provided. The patient was referred to [facility] on March 20, 2023. Pathology results reported by Rene Kocher, RN on 03/12/2023. Electronically Signed   By: Frederico Hamman M.D.   On: 03/12/2023 14:35   Result Date: 03/12/2023 CLINICAL DATA:  68 year old female presenting for ultrasound-guided biopsy of a left breast mass and left axillary lymph node. EXAM: ULTRASOUND GUIDED LEFT BREAST CORE NEEDLE BIOPSY COMPARISON:  Previous exam(s). PROCEDURE: I met with the patient and we discussed the procedure of ultrasound-guided biopsy, including benefits and alternatives. We discussed the high likelihood of a successful procedure. We discussed the risks of the procedure, including infection, bleeding, tissue injury, clip migration, and inadequate sampling. Informed written consent was given. The usual time-out protocol was performed immediately prior to the procedure. Lesion quadrant: Lower outer quadrant Using sterile technique and 1% Lidocaine as local anesthetic, under direct ultrasound visualization, a 14 gauge spring-loaded device was used to perform biopsy of a left breast mass at 6 o'clock, 3 cm from the nipple using an inferior approach. At the conclusion of the procedure a ribbon shaped  tissue marker clip was deployed into the biopsy cavity. Lesion quadrant: Left axilla Using sterile technique and 1% Lidocaine as local anesthetic, under direct ultrasound visualization, a 14 gauge spring-loaded device was used to perform biopsy of a left axillary lymph node using a lateral approach. At the conclusion of the procedure a HydroMARK shaped  tissue marker clip was deployed into the biopsy cavity. Follow up 2 view mammogram was performed and dictated separately. IMPRESSION: 1. Ultrasound guided biopsy of a left breast mass at 6 o'clock (ribbon clip). No apparent complications. 2. Ultrasound-guided biopsy of a left axillary lymph node (HydroMARK clip). No apparent complications. Electronically Signed: By: Frederico Hamman M.D. On: 03/11/2023 14:58   [place] AXILLARY NODE CORE BIOPSY LEFT Addendum Date: 03/12/2023 ADDENDUM REPORT: 03/12/2023 14:35 ADDENDUM: Pathology revealed GRADE I INVASIVE DUCTAL CARCINOMA, DUCTAL CARCINOMA IN SITU, CRIBRIFORM, LOW NUCLEAR GRADE, WITHOUT NECROSIS of the LEFT breast, 6:00 o'clock, 3cmfn (ribbon clip). This was found to be concordant by Dr. Frederico Hamman. Pathology revealed REACTIVE LYMPH NODE NEGATIVE FOR METASTATIC CARCINOMA of the LEFT axilla, (hydromark clip). This was found to be concordant by Dr. Frederico Hamman. Pathology results were discussed with the patient by telephone. The patient reported doing well after the biopsies with tenderness at the sites. Post biopsy instructions and care were reviewed and questions were answered. The patient was encouraged to call [facility] for any additional concerns. My direct phone number was provided. The patient was referred to [facility] on March 20, 2023. Pathology results reported by Rene Kocher, RN on 03/12/2023. Electronically Signed   By: Frederico Hamman M.D.   On: 03/12/2023 14:35   Result Date:  03/12/2023 CLINICAL DATA:  67 year old female presenting for ultrasound-guided biopsy of a left breast mass and left axillary lymph node. EXAM: ULTRASOUND GUIDED LEFT BREAST CORE NEEDLE BIOPSY COMPARISON:  Previous exam(s). PROCEDURE: I met with the patient and we discussed the procedure of ultrasound-guided biopsy, including benefits and alternatives. We discussed the high likelihood of a successful procedure. We discussed the risks of the procedure, including infection, bleeding, tissue injury, clip migration, and inadequate sampling. Informed written consent was given. The usual time-out protocol was performed immediately prior to the procedure. Lesion quadrant: Lower outer quadrant Using sterile technique and 1% Lidocaine as local anesthetic, under direct ultrasound visualization, a 14 gauge spring-loaded device was used to perform biopsy of a left breast mass at 6 o'clock, 3 cm from the nipple using an inferior approach. At the conclusion of the procedure a ribbon shaped tissue marker clip was deployed into the biopsy cavity. Lesion quadrant: Left axilla Using sterile technique and 1% Lidocaine as local anesthetic, under direct ultrasound visualization, a 14 gauge spring-loaded device was used to perform biopsy of a left axillary lymph node using a lateral approach. At the conclusion of the procedure a HydroMARK shaped tissue marker clip was deployed into the biopsy cavity. Follow up 2 view mammogram was performed and dictated separately. IMPRESSION: 1. Ultrasound guided biopsy of a left breast mass at 6 o'clock (ribbon clip). No apparent complications. 2. Ultrasound-guided biopsy of a left axillary lymph node (HydroMARK clip). No apparent complications. Electronically Signed: By: Frederico Hamman M.D. On: 03/11/2023 14:58   MM CLIP PLACEMENT LEFT Result Date: 03/11/2023 CLINICAL DATA:  Post biopsy mammogram of the left breast for clip placement. EXAM: 3D DIAGNOSTIC LEFT MAMMOGRAM POST ULTRASOUND BIOPSY  COMPARISON:  Previous exam(s). FINDINGS: 3D Mammographic images were obtained following ultrasound guided biopsy of a left breast mass at 6 o'clock and a left axillary lymph node. The biopsy marking clip in the left breast mass in expected position at the site of biopsy. The lymph node could not be visualized within the field of view. IMPRESSION: Appropriate positioning of the ribbon shaped biopsy  marking clip at the site of biopsy in the left breast mass at 6 o'clock. Final Assessment: Post Procedure Mammograms for Marker Placement Electronically Signed   By: Frederico Hamman M.D.   On: 03/11/2023 15:04   MM 3D DIAGNOSTIC MAMMOGRAM UNILATERAL LEFT BREAST Result Date: 03/01/2023 CLINICAL DATA:  Patient was recalled from screening mammogram for possible distortion. EXAM: DIGITAL DIAGNOSTIC UNILATERAL LEFT MAMMOGRAM WITH TOMOSYNTHESIS AND CAD; ULTRASOUND LEFT BREAST LIMITED TECHNIQUE: Left digital diagnostic mammography and breast tomosynthesis was performed. The images were evaluated with computer-aided detection. ; Targeted ultrasound examination of the left breast was performed. COMPARISON:  Previous exam(s). ACR Breast Density Category b: There are scattered areas of fibroglandular density. FINDINGS: Additional imaging of the left breast was performed. There is persistence of a spiculated mass in the 6 o'clock region of the breast. No additional abnormality is seen in the left breast. Targeted ultrasound is performed, showing an irregular hypoechoic mass with angular margins in the 6 o'clock region of the left breast 3 cm from the nipple measuring 7 x 8 x 6 mm. Sonographic evaluation of the left axilla shows a lymph node with cortical thickening measuring 3 mm. IMPRESSION: Suspicious 7 mm mass in the 6 o'clock region of the left breast 3 cm from the nipple. Indeterminate left axillary lymph node with focal cortical thickening. RECOMMENDATION: Ultrasound-guided core biopsy of the mass in the 6 o'clock region of  the left breast as well as the indeterminate axillary lymph node is recommended. I have discussed the findings and recommendations with the patient. If applicable, a reminder letter will be sent to the patient regarding the next appointment. BI-RADS CATEGORY  5: Highly suggestive of malignancy. Electronically Signed   By: Baird Lyons M.D.   On: 03/01/2023 16:37   [place] LIMITED ULTRASOUND INCLUDING AXILLA LEFT BREAST  Result Date: 03/01/2023 CLINICAL DATA:  Patient was recalled from screening mammogram for possible distortion. EXAM: DIGITAL DIAGNOSTIC UNILATERAL LEFT MAMMOGRAM WITH TOMOSYNTHESIS AND CAD; ULTRASOUND LEFT BREAST LIMITED TECHNIQUE: Left digital diagnostic mammography and breast tomosynthesis was performed. The images were evaluated with computer-aided detection. ; Targeted ultrasound examination of the left breast was performed. COMPARISON:  Previous exam(s). ACR Breast Density Category b: There are scattered areas of fibroglandular density. FINDINGS: Additional imaging of the left breast was performed. There is persistence of a spiculated mass in the 6 o'clock region of the breast. No additional abnormality is seen in the left breast. Targeted ultrasound is performed, showing an irregular hypoechoic mass with angular margins in the 6 o'clock region of the left breast 3 cm from the nipple measuring 7 x 8 x 6 mm. Sonographic evaluation of the left axilla shows a lymph node with cortical thickening measuring 3 mm. IMPRESSION: Suspicious 7 mm mass in the 6 o'clock region of the left breast 3 cm from the nipple. Indeterminate left axillary lymph node with focal cortical thickening. RECOMMENDATION: Ultrasound-guided core biopsy of the mass in the 6 o'clock region of the left breast as well as the indeterminate axillary lymph node is recommended. I have discussed the findings and recommendations with the patient. If applicable, a reminder letter will be sent to the patient regarding the next appointment.  BI-RADS CATEGORY  5: Highly suggestive of malignancy. Electronically Signed   By: Baird Lyons M.D.   On: 03/01/2023 16:37   MM 3D SCREENING MAMMOGRAM BILATERAL BREAST Result Date: 02/26/2023 CLINICAL DATA:  Screening. EXAM: DIGITAL SCREENING BILATERAL MAMMOGRAM WITH TOMOSYNTHESIS AND CAD TECHNIQUE: Bilateral screening digital craniocaudal and mediolateral oblique  mammograms were obtained. Bilateral screening digital breast tomosynthesis was performed. The images were evaluated with computer-aided detection. COMPARISON:  Previous exam(s). ACR Breast Density Category b: There are scattered areas of fibroglandular density. FINDINGS: In the left breast, possible distortion warrants further evaluation. In the right breast, no findings suspicious for malignancy. IMPRESSION: Further evaluation is suggested for possible distortion in the left breast. RECOMMENDATION: Diagnostic mammogram and possibly ultrasound of the left breast. (Code:[id number]-32M) The patient will be contacted regarding the findings, and additional imaging will be scheduled. BI-RADS CATEGORY  0: Incomplete: Need additional imaging evaluation. Electronically Signed   By: Meda Klinefelter M.D.   On: 02/26/2023 07:51   DG Bone Density Result Date: 02/21/2023 EXAM: DUAL X-RAY ABSORPTIOMETRY (DXA) FOR BONE MINERAL DENSITY IMPRESSION: Referring Physician:  Donalee Citrin NGETICH Your patient completed a bone mineral density test using GE Lunar iDXA system (analysis version: 16). Technologist: BEC PATIENT: Name: [name], [name] Patient ID: [id number] Birth Date: September 15, 1955 Height: 65.5 in. Sex: Female Measured: 02/21/2023 Weight: 191.2 lbs. Indications: Albuterol, COPD, Depression, Desyrel, Estrogen Deficient, Family Hist. (Parent hip fracture), Glucocorticoids (Chronic) (255.41), Height Loss (781.91), History of Fracture (Adult) (V15.51), Hysterectomy, Insulin for Diabetes, Nexium, Postmenopausal, Prozac, Secondary Osteoporosis, Trelegy Fractures: Right Hand  Treatments: Calcium (E943.0), Vitamin D (E933.5) ASSESSMENT: The BMD measured at Forearm Radius 33% is 0.752 g/cm2 with a T-score of -1.4. This patient is considered osteopenic/low bone mass according to World Health Organization [facility]) criteria. The lumbar spine was excluded due to degenerative changes. The quality of the exam is good. Site Region Measured Date Measured Age YA BMD Significant CHANGE T-score Left Forearm Radius 33% 2/21/2023 67.6 -1.4 0.752 g/cm2 DualFemur Neck Left 2/21/2023 67.6 -0.4 0.978 g/cm2 DualFemur Total Mean 2/21/2023 67.6 0.7 1.098 g/cm2 World Health Organization [facility]) criteria for post-menopausal, Caucasian Women: Normal       T-score at or above -1 SD Osteopenia   T-score between -1 and -2.5 SD Osteoporosis T-score at or below -2.5 SD RECOMMENDATION: 1. All patients should optimize calcium and vitamin D intake. 2. Consider FDA-approved medical therapies in postmenopausal women and men aged 70 years and older, based on the following: a. A hip or vertebral (clinical or morphometric) fracture. b. T-score = -2.5 at the femoral neck or spine after appropriate evaluation to exclude secondary causes. c. Low bone mass (T-score between -1.0 and -2.5 at the femoral neck or spine) and a 10-year probability of a hip fracture = 3% or a 10-year probability of a major osteoporosis-related fracture = 20% based on the [place]-adapted WHO algorithm. d. Clinician judgment and/or patient preferences may indicate treatment for people with 10-year fracture probabilities above or below these levels. FOLLOW-UP: Patients with diagnosis of osteoporosis or at high risk for fracture should have regular bone mineral density tests.? Patients eligible for Medicare are allowed routine testing every 2 years.? The testing frequency can be increased to one year for patients who have rapidly progressing disease, are receiving or discontinuing medical therapy to restore bone mass, or have additional risk factors. I have  reviewed this study and agree with the findings. [facility] FRAX* 10-year Probability of Fracture Based on femoral neck BMD: DualFemur (Left) Major Osteoporotic Fracture: 14.7% Hip Fracture:                0.8% Population:                  [place] (Black) Risk Factors: Family Hist. (Parent hip fracture), Glucocorticoids (Chronic) (255.41), History of Fracture (Adult) (V15.51),  Secondary Osteoporosis *FRAX is a Armed forces logistics/support/administrative officer of the [employer] of [employer] for Metabolic Bone Disease, a World Science writer (WHO) [employer]. ASSESSMENT: The probability of a major osteoporotic fracture is 14.7% within the next ten years. The probability of a hip fracture is 0.8% within the next ten years. Electronically Signed   By: Romona Curls M.D.   On: 02/21/2023 15:20      IMPRESSION/PLAN: 1. Stage IA (cT1b, cN0, M0) low grade invasive ductal carcinoma; ER/PR+, HER2- Dr. Mitzi Hansen discussed the pathology findings and reviewed the nature of early stage breast disease. The consensus from the breast conference includes a lumpectomy followed by possible oncotype testing, adjuvant radiation, and antihormone treatment. Dr. Mitzi Hansen recommends external beam radiotherapy to the breast following her lumpectomy to reduce risks of local recurrence followed by antiestrogen therapy. We discussed the risks, benefits, short, and long term effects of radiotherapy, as well as the curative intent, and the patient is interested in proceeding. Dr. Mitzi Hansen discussed the delivery and logistics of radiotherapy and anticipates a course of 4 weeks of radiotherapy to the left breast with deep inspiration breath hold technique. We will see her back a few weeks after surgery to discuss the simulation process and anticipate starting radiotherapy about 4-6 weeks after surgery.    2. Possible genetic predisposition to malignancy. The patient is a candidate for genetic testing given her personal and family history.  She will meet with our geneticist today in clinic.   In a visit lasting 60 minutes, greater than 50% of the time was spent face to face reviewing her case, as well as in preparation of, discussing, and coordinating the patient's care.  The above documentation reflects my direct findings during this shared patient visit. Please see the separate note by Dr. Mitzi Hansen on this date for the remainder of the patient's plan of care.    Joyice Faster, [place]    **Disclaimer: This note was dictated with voice recognition software. Similar sounding words can inadvertently be transcribed and this note may contain transcription errors which may not have been corrected upon publication of note.**

## 2023-03-20 NOTE — Progress Notes (Signed)
 Ms. [name] was seen by a genetic counselor during the breast multidisciplinary clinic on March 18, 2023. Beyond her recent diagnosis of breast cancer, she did not report any other personal history of cancer.  She does not have any known family history of cancer.   She does not meet NCCN criteria for genetic testing at this time. She was still offered genetic counseling and testing but declined. We encourage her to contact us if there are any changes to her personal or family history of cancer. If she meets NCCN criteria based on the updated personal/family history, she would be recommended to have genetic counseling and testing.    Abigail Lady M. Rennie Plowman, MS, [facility] Genetic Counselor [name] (P) [phone]

## 2023-03-20 NOTE — Progress Notes (Signed)
 [facility] Clinical Social Work  Initial Assessment   [name] is a 68 y.o. year old female presenting alone. Clinical Social Work was referred by  [employer]  for assessment of psychosocial needs.   SDOH (Social Determinants of Health) assessments performed: Yes SDOH Interventions    Flowsheet Row Clinical Support from 03/20/2023 in [facility] - A Dept Of [place]. [facility] Office Visit from 12/31/2022 in [facility] - A Dept Of [facility]igh[facility]. [facility] Office Visit from 09/27/2020 in [facility]  SDOH Interventions     Food Insecurity Interventions Intervention Not Indicated Intervention Not Indicated --  Housing Interventions Intervention Not Indicated Intervention Not Indicated --  Transportation Interventions SCAT (Specialized Community Area Transporation), Patient Resources (Friends/Family), Payor Benefit Intervention Not Indicated --  Utilities Interventions Intervention Not Indicated Intervention Not Indicated --  Alcohol Usage Interventions -- Intervention Not Indicated (Score <7) --  Depression Interventions/Treatment  -- -- Medication, Currently on Treatment  Financial Strain Interventions -- Intervention Not Indicated --  Physical Activity Interventions -- Intervention Not Indicated --  Stress Interventions -- Intervention Not Indicated --  Social Connections Interventions -- Intervention Not Indicated --  Health Literacy Interventions -- Intervention Not Indicated --       SDOH Screenings   Food Insecurity: No Food Insecurity (03/20/2023)  Housing: Low Risk  (03/20/2023)  Transportation Needs: No Transportation Needs (03/20/2023)  Utilities: Not At Risk (03/20/2023)  Alcohol Screen: Low Risk  (12/31/2022)  Depression (PHQ2-9): Medium Risk (12/31/2022)  Financial Resource Strain: Low Risk  (12/31/2022)  Physical Activity: Inactive (12/31/2022)  Social Connections: Socially Isolated (12/31/2022)  Stress: No  Stress Concern Present (12/31/2022)  Tobacco Use: Medium Risk (03/20/2023)  Health Literacy: Inadequate Health Literacy (12/31/2022)     Distress Screen completed: No     No data to display            Family/Social Information:  Housing Arrangement: patient lives alone Family members/support persons in your life? Family Transportation concerns: no, uses [employer], insurance transportation, and has some family help (sister in [place])  Employment: Legally disabled .  Income source: Social Security disability- now retirement Financial concerns:  potentially with a high utility bill Type of concern: Utilities Food access concerns: no Religious or spiritual practice: Not known Advanced directives: No Services Currently in place:  [facility] Medicare; trazodone for anxiety; Access [place]; SNAP  Coping/ Adjustment to diagnosis: Patient understands treatment plan and what happens next? yes Patient reported stressors: Adjusting to my illness Current coping skills/ strengths: Capable of independent living  and Other: able to access community resources    SUMMARY: Current SDOH Barriers:  Financial constraints related to high utility bill  Clinical Social Work Clinical Goal(s):  Explore community resource options for unmet needs related to:  Financial Strain   Interventions: Discussed common feeling and emotions when being diagnosed with cancer, and the importance of support during treatment Informed patient of the support team roles and support services at [facility] Provided CSW contact information and encouraged patient to call with any questions or concerns Completed Nicholes Rough application on pt's behalf Provided education on community resources for utility assistance (LIEAP; [employer])   Follow Up Plan: Patient will contact CSW with any support or resource needs Patient verbalizes understanding of plan: Yes    Jamion Carter E Sheffield Hawker, LCSW Clinical Social Worker [facility]

## 2023-03-22 ENCOUNTER — Other Ambulatory Visit: Payer: Self-pay | Admitting: Surgery

## 2023-03-22 DIAGNOSIS — C50912 Malignant neoplasm of unspecified site of left female breast: Secondary | ICD-10-CM

## 2023-03-26 ENCOUNTER — Encounter (HOSPITAL_BASED_OUTPATIENT_CLINIC_OR_DEPARTMENT_OTHER): Payer: Self-pay | Admitting: Surgery

## 2023-03-26 ENCOUNTER — Other Ambulatory Visit: Payer: Self-pay

## 2023-03-28 ENCOUNTER — Telehealth: Payer: Self-pay | Admitting: *Deleted

## 2023-03-28 ENCOUNTER — Encounter: Payer: Self-pay | Admitting: *Deleted

## 2023-03-28 DIAGNOSIS — C50512 Malignant neoplasm of lower-outer quadrant of left female breast: Secondary | ICD-10-CM

## 2023-03-28 NOTE — Telephone Encounter (Signed)
 Spoke to pt concerning [facility] from 03/20/23. Denies questions or concerns regarding dx or treatment care plan. Encourage pt to call with needs. Received verbal understanding.

## 2023-03-29 ENCOUNTER — Telehealth: Payer: Self-pay

## 2023-03-29 NOTE — Telephone Encounter (Signed)
 Copied from CRM [id number]. Topic: Clinical - Home Health Verbal Orders >> Mar 29, 2023  1:25 PM Clide Dales wrote: Patient is requesting home health services to help recover from her breast surgery on 3/19. Please advise

## 2023-03-29 NOTE — Telephone Encounter (Signed)
 Call to patient for Clarification of request unable to reach or leave VM. VM not setup

## 2023-04-01 ENCOUNTER — Emergency Department (HOSPITAL_COMMUNITY)

## 2023-04-01 ENCOUNTER — Encounter (HOSPITAL_BASED_OUTPATIENT_CLINIC_OR_DEPARTMENT_OTHER): Payer: Self-pay | Admitting: Anesthesiology

## 2023-04-01 ENCOUNTER — Telehealth: Payer: Self-pay

## 2023-04-01 ENCOUNTER — Ambulatory Visit: Payer: Self-pay | Admitting: Internal Medicine

## 2023-04-01 ENCOUNTER — Other Ambulatory Visit: Payer: Self-pay

## 2023-04-01 ENCOUNTER — Observation Stay (HOSPITAL_COMMUNITY)
Admission: EM | Admit: 2023-04-01 | Discharge: 2023-04-03 | Disposition: A | Discharge status: Home / Self Care | Attending: Internal Medicine | Admitting: Internal Medicine

## 2023-04-01 ENCOUNTER — Encounter (HOSPITAL_COMMUNITY): Payer: Self-pay

## 2023-04-01 ENCOUNTER — Encounter (HOSPITAL_BASED_OUTPATIENT_CLINIC_OR_DEPARTMENT_OTHER)
Admission: RE | Admit: 2023-04-01 | Discharge: 2023-04-01 | Disposition: A | Discharge status: Home / Self Care | Source: Ambulatory Visit | Attending: Surgery | Admitting: Surgery

## 2023-04-01 ENCOUNTER — Ambulatory Visit (HOSPITAL_COMMUNITY)
Admission: EM | Admit: 2023-04-01 | Discharge: 2023-04-01 | Disposition: A | Discharge status: Home / Self Care | Attending: Family Medicine | Admitting: Family Medicine

## 2023-04-01 DIAGNOSIS — Z7984 Long term (current) use of oral hypoglycemic drugs: Secondary | ICD-10-CM | POA: Insufficient documentation

## 2023-04-01 DIAGNOSIS — Z1152 Encounter for screening for COVID-19: Secondary | ICD-10-CM | POA: Diagnosis not present

## 2023-04-01 DIAGNOSIS — Z01818 Encounter for other preprocedural examination: Secondary | ICD-10-CM | POA: Insufficient documentation

## 2023-04-01 DIAGNOSIS — J441 Chronic obstructive pulmonary disease with (acute) exacerbation: Principal | ICD-10-CM | POA: Insufficient documentation

## 2023-04-01 DIAGNOSIS — E876 Hypokalemia: Secondary | ICD-10-CM | POA: Diagnosis not present

## 2023-04-01 DIAGNOSIS — R0600 Dyspnea, unspecified: Secondary | ICD-10-CM | POA: Diagnosis not present

## 2023-04-01 DIAGNOSIS — J9611 Chronic respiratory failure with hypoxia: Secondary | ICD-10-CM

## 2023-04-01 DIAGNOSIS — R0602 Shortness of breath: Secondary | ICD-10-CM | POA: Diagnosis not present

## 2023-04-01 DIAGNOSIS — I10 Essential (primary) hypertension: Secondary | ICD-10-CM | POA: Diagnosis present

## 2023-04-01 DIAGNOSIS — R7989 Other specified abnormal findings of blood chemistry: Secondary | ICD-10-CM

## 2023-04-01 DIAGNOSIS — Z7985 Long-term (current) use of injectable non-insulin antidiabetic drugs: Secondary | ICD-10-CM | POA: Insufficient documentation

## 2023-04-01 DIAGNOSIS — J9601 Acute respiratory failure with hypoxia: Secondary | ICD-10-CM

## 2023-04-01 DIAGNOSIS — R0902 Hypoxemia: Secondary | ICD-10-CM

## 2023-04-01 DIAGNOSIS — E785 Hyperlipidemia, unspecified: Secondary | ICD-10-CM | POA: Diagnosis not present

## 2023-04-01 DIAGNOSIS — R079 Chest pain, unspecified: Secondary | ICD-10-CM | POA: Diagnosis not present

## 2023-04-01 DIAGNOSIS — F411 Generalized anxiety disorder: Secondary | ICD-10-CM | POA: Diagnosis not present

## 2023-04-01 DIAGNOSIS — J449 Chronic obstructive pulmonary disease, unspecified: Secondary | ICD-10-CM | POA: Diagnosis present

## 2023-04-01 DIAGNOSIS — M62838 Other muscle spasm: Secondary | ICD-10-CM | POA: Insufficient documentation

## 2023-04-01 DIAGNOSIS — Z87891 Personal history of nicotine dependence: Secondary | ICD-10-CM | POA: Insufficient documentation

## 2023-04-01 DIAGNOSIS — E119 Type 2 diabetes mellitus without complications: Secondary | ICD-10-CM

## 2023-04-01 DIAGNOSIS — I2510 Atherosclerotic heart disease of native coronary artery without angina pectoris: Secondary | ICD-10-CM | POA: Diagnosis not present

## 2023-04-01 DIAGNOSIS — G4700 Insomnia, unspecified: Secondary | ICD-10-CM | POA: Insufficient documentation

## 2023-04-01 DIAGNOSIS — R918 Other nonspecific abnormal finding of lung field: Secondary | ICD-10-CM | POA: Diagnosis not present

## 2023-04-01 DIAGNOSIS — D0512 Intraductal carcinoma in situ of left breast: Secondary | ICD-10-CM | POA: Diagnosis not present

## 2023-04-01 DIAGNOSIS — K589 Irritable bowel syndrome without diarrhea: Secondary | ICD-10-CM | POA: Insufficient documentation

## 2023-04-01 DIAGNOSIS — J439 Emphysema, unspecified: Secondary | ICD-10-CM | POA: Diagnosis not present

## 2023-04-01 DIAGNOSIS — Z79899 Other long term (current) drug therapy: Secondary | ICD-10-CM | POA: Insufficient documentation

## 2023-04-01 DIAGNOSIS — Z7982 Long term (current) use of aspirin: Secondary | ICD-10-CM | POA: Diagnosis not present

## 2023-04-01 DIAGNOSIS — Z8709 Personal history of other diseases of the respiratory system: Secondary | ICD-10-CM

## 2023-04-01 LAB — CBC
HCT: 43.6 % (ref 36.0–46.0)
Hemoglobin: 14.3 g/dL (ref 12.0–15.0)
MCH: 30.2 pg (ref 26.0–34.0)
MCHC: 32.8 g/dL (ref 30.0–36.0)
MCV: 92 fL (ref 80.0–100.0)
Platelets: 211 10*3/uL (ref 150–400)
RBC: 4.74 MIL/uL (ref 3.87–5.11)
RDW: 12.9 % (ref 11.5–15.5)
WBC: 5.5 10*3/uL (ref 4.0–10.5)
nRBC: 0 % (ref 0.0–0.2)

## 2023-04-01 LAB — BASIC METABOLIC PANEL
Anion gap: 9 (ref 5–15)
Anion gap: 7 (ref 5–15)
BUN: 5 mg/dL — ABNORMAL LOW (ref 8–23)
BUN: 7 mg/dL — ABNORMAL LOW (ref 8–23)
CO2: 21 mmol/L — ABNORMAL LOW (ref 22–32)
CO2: 23 mmol/L (ref 22–32)
Calcium: 10.9 mg/dL — ABNORMAL HIGH (ref 8.9–10.3)
Calcium: 10.7 mg/dL — ABNORMAL HIGH (ref 8.9–10.3)
Chloride: 110 mmol/L (ref 98–111)
Chloride: 108 mmol/L (ref 98–111)
Creatinine, Ser: 0.69 mg/dL (ref 0.44–1.00)
Creatinine, Ser: 0.53 mg/dL (ref 0.44–1.00)
GFR, Estimated: >60 mL/min (ref >60)
GFR, Estimated: >60 mL/min (ref >60)
Glucose, Bld: 107 mg/dL — ABNORMAL HIGH (ref 70–99)
Glucose, Bld: 120 mg/dL — ABNORMAL HIGH (ref 70–99)
Potassium: 4.3 mmol/L (ref 3.5–5.1)
Potassium: 3.2 mmol/L — ABNORMAL LOW (ref 3.5–5.1)
Sodium: 140 mmol/L (ref 135–145)
Sodium: 138 mmol/L (ref 135–145)

## 2023-04-01 LAB — TROPONIN I (HIGH SENSITIVITY)
Troponin I (High Sensitivity): 18 ng/L — ABNORMAL HIGH (ref <18)
Troponin I (High Sensitivity): 18 ng/L — ABNORMAL HIGH (ref <18)

## 2023-04-01 MED ORDER — IPRATROPIUM-ALBUTEROL 0.5-2.5 (3) MG/3ML IN SOLN
3.0000 mL | Freq: Once | RESPIRATORY_TRACT | Status: AC
  Administered 2023-04-01: 3 mL via RESPIRATORY_TRACT
  Filled 2023-04-01: qty 3

## 2023-04-01 MED ORDER — ALBUTEROL SULFATE (2.5 MG/3ML) 0.083% IN NEBU
5.0000 mg | INHALATION_SOLUTION | Freq: Once | RESPIRATORY_TRACT | Status: AC
  Administered 2023-04-02: 5 mg via RESPIRATORY_TRACT
  Filled 2023-04-01: qty 6

## 2023-04-01 MED ORDER — CHLORHEXIDINE GLUCONATE CLOTH 2 % EX PADS: 6.0000 | MEDICATED_PAD | Freq: Once | CUTANEOUS | Status: AC

## 2023-04-01 MED ORDER — DEXAMETHASONE SODIUM PHOSPHATE 10 MG/ML IJ SOLN
10.0000 mg | Freq: Once | INTRAMUSCULAR | Status: AC
  Administered 2023-04-02: 10 mg via INTRAVENOUS
  Filled 2023-04-01: qty 1

## 2023-04-01 MED ORDER — IOHEXOL 350 MG/ML SOLN
75.0000 mL | Freq: Once | INTRAVENOUS | Status: AC | PRN
  Administered 2023-04-01: 75 mL via INTRAVENOUS

## 2023-04-01 NOTE — Progress Notes (Signed)

## 2023-04-01 NOTE — Telephone Encounter (Signed)
 Chief Complaint: SOB and chest pain Symptoms: severe SOB, 9/10 chest pain Frequency: x 2-3 months or more, comes and goes Pertinent Negatives: Patient denies N/A. Disposition: [x] ED /[] Urgent Care (no appt availability in office) / [] Appointment(In office/virtual)/ []  [facility]/ [] Home Care/ [] Refused Recommended Disposition /[] [facility]/ []  Follow-up with PCP Additional Notes: Daughter, Kenney Houseman, states patient is in need of oxygen. Patient states she was on oxygen in the past and was taken off for a few months by Nurse Mitzie Na at [facility]. She was supposed to have breast cancer surgery that was cancelled today during her pre admission testing. Daughter states Nurse Neysa Bonito at [facility] told them the surgery is being cancelled and to call her PCP to be placed back on oxygen, they deny any advice to seek emergency care. Daughter handed phone to patient for triage. Patient keeps insisting she needs to speak with Nurse Mitzie Na, informed her no nurses by that name at the office. Advised for severe chest pain and SOB patient should call 911 and go to ED. She states"you don't know what you're talking about." Insisting on speaking to office. Called [facility] and spoke with staff member, Waynard Edwards, aware of ED refusal and states no staff available to speak with patient at this time. Daughter asked to call back at [phone]  Copied from CRM [id number]. Topic: Clinical - Red Word Triage >> Apr 01, 2023 10:53 AM Franchot Heidelberg wrote: Red Word that prompted transfer to Nurse Triage:   Pt is in need of oxygen, says it is an emergency Reason for Disposition  SEVERE difficulty breathing (e.g., struggling for each breath, speaks in single words)  Answer Assessment - Initial Assessment Questions 1. RESPIRATORY STATUS: "Describe your breathing?" (e.g., wheezing, shortness of breath, unable to speak, severe coughing)      Shortness of breath.  2. ONSET: "When did this breathing problem begin?"      "It's been  going on for a while".  3. PATTERN "Does the difficult breathing come and go, or has it been constant since it started?"      Comes and goes.  4. SEVERITY: "How bad is your breathing?" (e.g., mild, moderate, severe)    - MILD: No SOB at rest, mild SOB with walking, speaks normally in sentences, can lie down, no retractions, pulse < 100.    - MODERATE: SOB at rest, SOB with minimal exertion and prefers to sit, cannot lie down flat, speaks in phrases, mild retractions, audible wheezing, pulse 100-120.    - SEVERE: Very SOB at rest, speaks in single words, struggling to breathe, sitting hunched forward, retractions, pulse > 120.      Severe today.  5. RECURRENT SYMPTOM: "Have you had difficulty breathing before?" If Yes, ask: "When was the last time?" and "What happened that time?"      Yes.  6. CANCER: "What type of cancer do you have?"      Breast.  7. CANCER - TREATMENT: "What cancer treatments have you received?" "When did you last receive them?" (e.g., chemotherapy, immunotherapy, radiation, or recent surgery). If triager has access to the patient's medical record, triager should review treatments and administration dates.     Patient had surgery scheduled for Wednesday and was cancelled today. Chemo was supposed to start after surgery.  8. CANCER - NEUTROPENIA RISK: "Have you received chemotherapy recently?" If Yes, ask: "When was it and what was your last WBC and ANC (absolute neutrophil count)?" "Were you told that your white cell count was low?"  If triager has access to the patient's medical record, triager should review most recent labs. An ANC less than 1,000 - 1,500 means that the neutrophils are low and the immune system is weak.     Denies chemotherapy yet.  9. CARDIAC HISTORY: "Do you have any history of heart disease?" (e.g., heart attack, angina, bypass surgery, angioplasty)      Denies.  10. LUNG HISTORY: "Do you have any history of lung disease?"  (e.g., pulmonary embolus,  pleural effusion, asthma, emphysema)       Sarcoidosis.  11. OTHER SYMPTOMS: "Do you have any other symptoms? (e.g., dizziness, runny nose, cough, chest pain, fever)       Chest pain (x 2-3 months).  12. O2 SATURATION MONITOR: "Do you use an oxygen saturation monitor (pulse oximeter) at home?" If Yes, ask: "What is your reading (oxygen level) today?" "What is your usual oxygen saturation reading?" (e.g., 95%)       Patient was told at Pre Admission Testing that her level was low and she needs oxygen immediately.  Protocols used: Cancer - Breathing Difficulty-A-AH

## 2023-04-01 NOTE — Telephone Encounter (Signed)
 Chief Complaint: Low Oxygen  Disposition: [x] ED [x]  Follow-up with PCP  Additional Notes: Pt states she was scheduled for a surgery for her breast cancer this Wed and it was cancelled by anesthesia. Pt states she went to get blood work done today and was told her her O2 saturation was "very low." When asked what is was, pt states it was 94%. Pt does not have a way to check her O2 saturation right now. Pt is requesting a call back asap from Dr. Henriette Combs office to discuss pre-approval for her surgery. Pt told that she needs to go to ED per previous RN triage today but pt again declines. Pt is adamant someone from office contacts her today.     Copied from CRM [id number]. Topic: Clinical - Red Word Triage >> Apr 01, 2023  2:57 PM Higinio Roger wrote: Red Word that prompted transfer to Nurse Triage: Shortness of breath. Needs oxygen tanks. Currently having difficulty breathing. Reason for Disposition  [1] MODERATE difficulty breathing (e.g., speaks in phrases, SOB even at rest, pulse 100-120) AND [2] NEW-onset or WORSE than normal  Protocols used: Cancer - Breathing Difficulty-A-AH

## 2023-04-01 NOTE — ED Provider Notes (Signed)
 [place] EMERGENCY DEPARTMENT AT [facility] Provider Note   CSN: [id number] Arrival date & time: 04/01/23  1805     History {Add pertinent medical, surgical, social history, OB history to HPI:1} Chief Complaint  Patient presents with   Chest Pain   Shortness of Breath    [name] is a 68 y.o. female.  Patient is a 68 year old female with a history of COPD, hypertension, diabetes and recently diagnosed breast cancer who presents with chest pain and shortness of breath.  She says she has had some worsening shortness of breath over the last couple days.  She has developed some intermittent pain in the center of her chest.  It is a little bit worse with coughing a little bit worse with breathing.  She denies any leg pain or swelling.  Her pain is not worse with exertion.  She has a little bit of a cough but no runny nose congestion.  No fevers.  She was scheduled to have an upcoming mastectomy.  She was at a preop appointment this morning and was noted to be hypoxic with oxygen saturations in the mid 80s after ambulation.  She is not on home oxygen.  She is inhalers at home but does not have a nebulizer machine.       Home Medications Prior to Admission medications   Medication Sig Start Date End Date Taking? Authorizing Provider  albuterol (PROAIR HFA) 108 (90 Base) MCG/ACT inhaler Inhale 1-2 puffs into the lungs every 6 (six) hours as needed for wheezing or shortness of breath. 12/17/22   Marcine Matar, MD  amLODipine (NORVASC) 10 MG tablet Take 1 tablet (10 mg total) by mouth daily. 12/31/22   Marcine Matar, MD  aspirin 81 MG tablet Take 81 mg by mouth daily.    [provider]  atorvastatin (LIPITOR) 80 MG tablet Take 1 tablet (80 mg total) by mouth daily. 12/17/22   Marcine Matar, MD  Camphor-Eucalyptus-Menthol (VICKS VAPORUB EX) Apply 1 application topically 2 (two) times daily as needed (breathe and soreness).    [provider]   carvedilol (COREG) 12.5 MG tablet Take 1 tablet (12.5 mg total) by mouth 2 (two) times daily with a meal. 12/17/22   Marcine Matar, MD  Continuous Glucose Receiver (FREESTYLE LIBRE 2 READER) DEVI Use to check glucose continuously throughout the day. E11.69 12/17/22   Marcine Matar, MD  Continuous Glucose Sensor (FREESTYLE LIBRE 2 SENSOR) MISC Place 1 sensor on the skin every 14 days. Use to check glucose continuously. E11.69 12/17/22   Marcine Matar, MD  esomeprazole (NEXIUM) 40 MG capsule Take 1 capsule (40 mg total) by mouth daily. 12/17/22   Marcine Matar, MD  FLUoxetine (PROZAC) 40 MG capsule Take 1 capsule (40 mg total) by mouth daily. 12/17/22   Marcine Matar, MD  Fluticasone-Umeclidin-Vilant (TRELEGY ELLIPTA) 100-62.5-25 MCG/ACT AEPB Take 1 puff by mouth daily. 02/20/23   Marcine Matar, MD  linaclotide [facility]) 145 MCG CAPS capsule Take 1 capsule (145 mcg total) by mouth daily before breakfast. 02/25/23   Marcine Matar, MD  lisinopril-hydrochlorothiazide (ZESTORETIC) 20-12.5 MG tablet Take 1 tablet by mouth daily. 12/17/22   Marcine Matar, MD  metFORMIN (GLUCOPHAGE-XR) 500 MG 24 hr tablet TAKE 1 TABLET BY MOUTH TWICE  DAILY WITH MEALS 01/10/23   Marcine Matar, MD  montelukast (SINGULAIR) 10 MG tablet Take 1 tablet (10 mg total) by mouth at bedtime. 12/17/22   Jonah Blue  B, MD  Naproxen Sodium (ALEVE PO) Take 1 tablet by mouth at bedtime as needed.    [provider]  nitroGLYCERIN (NITROSTAT) 0.3 MG SL tablet DISSOLVE 1 TABLET UNDER THE  TONGUE EVERY 5 MINUTES AS NEEDED FOR CHEST PAIN. MAX OF 3 TABLETS IN 15 MINUTES. CALL 911 IF PAIN  PERSISTS. 04/02/22   Ngetich, Dinah C, NP  Semaglutide, 1 MG/DOSE, 4 MG/3ML SOPN Inject 1 mg as directed once a week. 02/08/23   Marcine Matar, MD  tiZANidine (ZANAFLEX) 2 MG tablet Take 2 mg by mouth every 6 (six) hours as needed. 12/01/19   [provider]  traZODone (DESYREL) 50 MG tablet Take 1  tablet (50 mg total) by mouth at bedtime as needed. 12/17/22   Marcine Matar, MD      Allergies    Flonase [fluticasone propionate]    Review of Systems   Review of Systems  Constitutional:  Negative for chills, diaphoresis, fatigue and fever.  HENT:  Negative for congestion, rhinorrhea and sneezing.   Eyes: Negative.   Respiratory:  Positive for cough and shortness of breath. Negative for chest tightness.   Cardiovascular:  Positive for chest pain. Negative for leg swelling.  Gastrointestinal:  Negative for abdominal pain, blood in stool, diarrhea, nausea and vomiting.  Genitourinary:  Negative for difficulty urinating, flank pain, frequency and hematuria.  Musculoskeletal:  Negative for arthralgias and back pain.  Skin:  Negative for rash.  Neurological:  Negative for dizziness, speech difficulty, weakness, numbness and headaches.    Physical Exam Updated Vital Signs BP (!) 179/83 (BP Location: Left Arm)   Pulse 66   Temp (!) 97.5 F (36.4 C) (Oral)   Resp 17   Ht 5\' 7"  (1.702 m)   Wt 79.8 kg   SpO2 93%   BMI 27.57 kg/m  Physical Exam Constitutional:      Appearance: She is well-developed.  HENT:     Head: Normocephalic and atraumatic.  Eyes:     Pupils: Pupils are equal, round, and reactive to light.  Cardiovascular:     Rate and Rhythm: Normal rate and regular rhythm.     Heart sounds: Normal heart sounds.  Pulmonary:     Effort: Pulmonary effort is normal. No respiratory distress.     Breath sounds: Decreased breath sounds present. No wheezing or rales.  Chest:     Chest wall: No tenderness.  Abdominal:     General: Bowel sounds are normal.     Palpations: Abdomen is soft.     Tenderness: There is no abdominal tenderness. There is no guarding or rebound.  Musculoskeletal:        General: Normal range of motion.     Cervical back: Normal range of motion and neck supple.     Comments: No edema or calf tenderness  Lymphadenopathy:     Cervical: No  cervical adenopathy.  Skin:    General: Skin is warm and dry.     Findings: No rash.  Neurological:     Mental Status: She is alert and oriented to person, place, and time.     ED Results / Procedures / Treatments   Labs (all labs ordered are listed, but only abnormal results are displayed) Labs Reviewed  BASIC METABOLIC PANEL - Abnormal; Notable for the following components:      Result Value   Potassium 3.2 (*)    Glucose, Bld 120 (*)    BUN 7 (*)    Calcium 10.7 (*)  All other components within normal limits  TROPONIN I (HIGH SENSITIVITY) - Abnormal; Notable for the following components:   Troponin I (High Sensitivity) 18 (*)    All other components within normal limits  TROPONIN I (HIGH SENSITIVITY) - Abnormal; Notable for the following components:   Troponin I (High Sensitivity) 18 (*)    All other components within normal limits  CBC    EKG EKG Interpretation Date/Time:  Monday April 01 2023 18:35:48 EDT Ventricular Rate:  74 PR Interval:  231 QRS Duration:  116 QT Interval:  439 QTC Calculation: 488 R Axis:   8  Text Interpretation: Sinus rhythm Prolonged PR interval Incomplete left bundle branch block LVH with secondary repolarization abnormality Borderline prolonged QT interval similar to prior EKGs Confirmed by Rolan Bucco [phone]) on 04/01/2023 9:24:08 PM  Radiology CT Angio Chest PE W/Cm &/Or Wo Cm Result Date: 04/01/2023 CLINICAL DATA:  Pulmonary embolism suspected, high probability. Chest pain and shortness of breath. EXAM: CT ANGIOGRAPHY CHEST WITH CONTRAST TECHNIQUE: Multidetector CT imaging of the chest was performed using the standard protocol during bolus administration of intravenous contrast. Multiplanar CT image reconstructions and MIPs were obtained to evaluate the vascular anatomy. RADIATION DOSE REDUCTION: This exam was performed according to the departmental dose-optimization program which includes automated exposure control, adjustment of the  mA and/or kV according to patient size and/or use of iterative reconstruction technique. CONTRAST:  75mL OMNIPAQUE IOHEXOL 350 MG/ML SOLN COMPARISON:  02/03/2015. FINDINGS: Cardiovascular: The heart is mildly enlarged and there is a trace pericardial effusion. Three-vessel coronary artery calcifications are noted. There is atherosclerotic calcification of the aorta without evidence of aneurysm. The pulmonary trunk is normal in caliber. No pulmonary embolism is seen. Mediastinum/Nodes: No mediastinal lymphadenopathy is seen. There is a nonspecific prominent lymph node at the right hilum measuring 1.1 cm. No axillary lymphadenopathy. The thyroid gland, trachea, and esophagus are within normal limits. There is a small hiatal hernia. Lungs/Pleura: Emphysematous changes are present in the lungs. Atelectasis is present bilaterally. No effusion or pneumothorax is seen. There is a 4 mm subpleural nodule in the left upper lobe, axial image 52. Upper Abdomen: There is a left adrenal nodule measuring 2.7 cm with attenuation and 7 Hounsfield units, compatible with adrenal adenoma. No acute abnormality. Musculoskeletal: Degenerative changes are present in the thoracic spine. No acute osseous abnormality. Review of the MIP images confirms the above findings. IMPRESSION: 1. No evidence of pulmonary embolism or other acute process. 2. Emphysema. 3. 4 mm left upper lobe pulmonary nodule. No follow-up needed if patient is low-risk.This recommendation follows the consensus statement: Guidelines for Management of Incidental Pulmonary Nodules Detected on CT Images: From the Fleischner Society 2017; Radiology 2017; 284:228-243. 4. Left adrenal adenoma. 5. Coronary artery calcifications and aortic atherosclerosis. Electronically Signed   By: Thornell Sartorius M.D.   On: 04/01/2023 23:00   DG Chest 2 View Result Date: 04/01/2023 CLINICAL DATA:  Chest pain, short of breath, hypoxia EXAM: CHEST - 2 VIEW COMPARISON:  11/20/2014 FINDINGS:  Frontal and lateral views of the chest demonstrate an unremarkable cardiac silhouette. Lungs are hyperinflated, lungs are hyperinflated with background parenchymal scarring consistent with emphysema. No acute airspace disease, effusion, or pneumothorax. No acute bony abnormalities. IMPRESSION: 1. Emphysema.  No acute airspace disease. Electronically Signed   By: Sharlet Salina M.D.   On: 04/01/2023 20:17    Procedures Procedures  {Document cardiac monitor, telemetry assessment procedure when appropriate:1}  Medications Ordered in ED Medications  dexamethasone (DECADRON) injection 10 mg (  has no administration in time range)  albuterol (PROVENTIL) (2.5 MG/3ML) 0.083% nebulizer solution 5 mg (has no administration in time range)  ipratropium-albuterol (DUONEB) 0.5-2.5 (3) MG/3ML nebulizer solution 3 mL (3 mLs Nebulization Given 04/01/23 2214)  iohexol (OMNIPAQUE) 350 MG/ML injection 75 mL (75 mLs Intravenous Contrast Given 04/01/23 2226)    ED Course/ Medical Decision Making/ A&P   {   Click here for ABCD2, HEART and other calculatorsREFRESH Note before signing :1}                              Medical Decision Making Amount and/or Complexity of Data Reviewed Labs: ordered. Radiology: ordered.  Risk Prescription drug management.   Patient is a 68 year old with a history of COPD and recently diagnosed breast cancer who presents with some increasing shortness of breath over the last couple days.  No rhinorrhea or fevers.  No productive cough.  Chest x-ray was interpreted by me and confirmed by the radiologist to show no evidence of pneumonia.  No pulmonary edema.  He did have some pain in the center of her chest which seems to be worse with coughing and deep breathing.  EKG does not show any ischemic changes.  Troponins are 18 x 2 which is right at the cutoff.  They are flat..  No other symptoms that sound more concerning for ACS.  No exertional chest pain.  She had a CT scan which does not show  any evidence of pulmonary embolus or other acute abnormality.  No evidence of pneumonia.  She was given nebulizer treatment.  She says overall she is feeling better.  She does not appear to be short of breath.  However we did ambulate her on the side the bed and oxygen saturations dropped down to 88%.  Will try another nebulizer treatment as well as some steroids.  Will reassess after this.  Final Clinical Impression(s) / ED Diagnoses Final diagnoses:  COPD exacerbation (HCC)    Rx / DC Orders ED Discharge Orders     None

## 2023-04-01 NOTE — ED Triage Notes (Signed)
 Patient is here with SOB- that started several months ago. Patient states she was scheduled for pre-op today and advised to go ED.

## 2023-04-01 NOTE — ED Triage Notes (Signed)
 Patient states she was previous on continuous oxygen but her provider took her off.

## 2023-04-01 NOTE — Discharge Instructions (Signed)
To the ER 

## 2023-04-01 NOTE — Telephone Encounter (Signed)
 Patient was called and informed of a 10:10 a.m. availability for tomorrow. Patient states she is on the way to urgent care at this time.

## 2023-04-01 NOTE — Telephone Encounter (Signed)
 I should have an opening at 10:10 a.m tomorrow.  Please call the pt in that slot to confirm se will not be keeping that appt then pt this pt in that slot if she is able to come.

## 2023-04-01 NOTE — ED Triage Notes (Signed)
 Pt arrived via POV. C/o chest pain w/respiration and SOB that began this am. Has mastectomy scheduled 3/19.  AOx4

## 2023-04-01 NOTE — ED Provider Notes (Signed)
 [facility]    CSN: [id number] Arrival date & time: 04/01/23  1718      History   Chief Complaint Chief Complaint  Patient presents with   Shortness of Breath    HPI [name] is a 68 y.o. female.    Shortness of Breath Here for shortness of breath and low oxygen.  Patient was at a preop clearance appointment this morning when her oxygen saturation was less than 90. She had previously been on home oxygen but this has been discontinued and she had gotten the company to come pick up her tanks.  No recent fever or new cough or congestion.  She is supposed be having breast surgery on March 19. She was asked to go to her primary for consideration of oxygen prescription.  She and her daughter have come here for evaluation.      Past Medical History:  Diagnosis Date   Anxiety    Asthma    Breast cancer (HCC)    Chest pain    COPD (chronic obstructive pulmonary disease) (HCC)    Depression    DM type 2 (diabetes mellitus, type 2) (HCC)    Hypertension    Pneumonia    Shortness of breath dyspnea     Patient Active Problem List   Diagnosis Date Noted   Malignant neoplasm of lower-outer quadrant of left breast of female, estrogen receptor positive (HCC) 03/15/2023   Invasive ductal carcinoma of breast, left (HCC) 03/13/2023   Osteopenia after menopause 02/27/2023   Nondisplaced fracture of proximal phalanx of left ring finger 07/25/2021   Displaced fracture of proximal phalanx of left little finger, initial encounter for closed fracture 07/07/2021   Onychomycosis of toenail 03/22/2021   Dry skin dermatitis 03/22/2021   DM type 2 (diabetes mellitus, type 2) (HCC) 02/18/2021   COPD GOLD 0  11/08/2020   Depression 11/08/2020   Mixed hyperlipidemia 11/08/2020   Mild sleep apnea 10/17/2020   Centrilobular emphysema (HCC) 06/26/2014   Primary hypertension 06/26/2014   Dyspnea 06/25/2014    Past Surgical History:  Procedure Laterality Date   BREAST  BIOPSY Left 03/11/2023   [place] LT BREAST BX W LOC DEV 1ST LESION IMG BX SPEC US GUIDE 03/11/2023 [facility]   TEE WITHOUT CARDIOVERSION N/A 08/24/2014   Procedure: TRANSESOPHAGEAL ECHOCARDIOGRAM (TEE);  Surgeon: Yates Decamp, MD;  Location: [facility] ENDOSCOPY;  Service: Cardiovascular;  Laterality: N/A;   TOTAL ABDOMINAL HYSTERECTOMY  01/15/2005    OB History   No obstetric history on file.      Home Medications    Prior to Admission medications   Medication Sig Start Date End Date Taking? Authorizing Provider  albuterol (PROAIR HFA) 108 (90 Base) MCG/ACT inhaler Inhale 1-2 puffs into the lungs every 6 (six) hours as needed for wheezing or shortness of breath. 12/17/22  Yes Marcine Matar, MD  amLODipine (NORVASC) 10 MG tablet Take 1 tablet (10 mg total) by mouth daily. 12/31/22  Yes Marcine Matar, MD  aspirin 81 MG tablet Take 81 mg by mouth daily.   Yes [provider]  atorvastatin (LIPITOR) 80 MG tablet Take 1 tablet (80 mg total) by mouth daily. 12/17/22  Yes Marcine Matar, MD  Camphor-Eucalyptus-Menthol (VICKS VAPORUB EX) Apply 1 application topically 2 (two) times daily as needed (breathe and soreness).   Yes [provider]  carvedilol (COREG) 12.5 MG tablet Take 1 tablet (12.5 mg total) by mouth 2 (two) times daily with a meal. 12/17/22  Yes Marcine Matar, MD  Continuous Glucose Receiver (FREESTYLE LIBRE 2 READER) DEVI Use to check glucose continuously throughout the day. E11.69 12/17/22  Yes Marcine Matar, MD  Continuous Glucose Sensor (FREESTYLE LIBRE 2 SENSOR) MISC Place 1 sensor on the skin every 14 days. Use to check glucose continuously. E11.69 12/17/22  Yes Marcine Matar, MD  esomeprazole (NEXIUM) 40 MG capsule Take 1 capsule (40 mg total) by mouth daily. 12/17/22  Yes Marcine Matar, MD  FLUoxetine (PROZAC) 40 MG capsule Take 1 capsule (40 mg total) by mouth daily. 12/17/22  Yes Marcine Matar, MD  Fluticasone-Umeclidin-Vilant  (TRELEGY ELLIPTA) 100-62.5-25 MCG/ACT AEPB Take 1 puff by mouth daily. 02/20/23  Yes Marcine Matar, MD  linaclotide [facility]) 145 MCG CAPS capsule Take 1 capsule (145 mcg total) by mouth daily before breakfast. 02/25/23  Yes Marcine Matar, MD  lisinopril-hydrochlorothiazide (ZESTORETIC) 20-12.5 MG tablet Take 1 tablet by mouth daily. 12/17/22  Yes Marcine Matar, MD  metFORMIN (GLUCOPHAGE-XR) 500 MG 24 hr tablet TAKE 1 TABLET BY MOUTH TWICE  DAILY WITH MEALS 01/10/23  Yes Marcine Matar, MD  Naproxen Sodium (ALEVE PO) Take 1 tablet by mouth at bedtime as needed.   Yes [provider]  Semaglutide, 1 MG/DOSE, 4 MG/3ML SOPN Inject 1 mg as directed once a week. 02/08/23  Yes Marcine Matar, MD  tiZANidine (ZANAFLEX) 2 MG tablet Take 2 mg by mouth every 6 (six) hours as needed. 12/01/19  Yes [provider]  montelukast (SINGULAIR) 10 MG tablet Take 1 tablet (10 mg total) by mouth at bedtime. 12/17/22   Marcine Matar, MD  nitroGLYCERIN (NITROSTAT) 0.3 MG SL tablet DISSOLVE 1 TABLET UNDER THE  TONGUE EVERY 5 MINUTES AS NEEDED FOR CHEST PAIN. MAX OF 3 TABLETS IN 15 MINUTES. CALL 911 IF PAIN  PERSISTS. 04/02/22   Ngetich, Dinah C, NP  traZODone (DESYREL) 50 MG tablet Take 1 tablet (50 mg total) by mouth at bedtime as needed. 12/17/22   Marcine Matar, MD    Family History Family History  Problem Relation Age of Onset   Allergies Mother    Allergies Daughter    Asthma Son        had as a child   BRCA 1/2 Neg Hx    Breast cancer Neg Hx     Social History Social History   Tobacco Use   Smoking status: Former    Current packs/day: 0.00    Average packs/day: 1 pack/day for 38.0 years (38.0 ttl pk-yrs)    Types: Cigarettes    Start date: 01/15/1970    Quit date: 01/16/2008    Years since quitting: 15.2   Smokeless tobacco: Never  Vaping Use   Vaping status: Never Used  Substance Use Topics   Alcohol use: No    Alcohol/week: 0.0 standard drinks of alcohol    Drug use: No     Allergies   Flonase [fluticasone propionate]   Review of Systems Review of Systems  Respiratory:  Positive for shortness of breath.      Physical Exam Triage Vital Signs ED Triage Vitals [04/01/23 1726]  Encounter Vitals Group     BP (!) 140/79     Systolic BP Percentile      Diastolic BP Percentile      Pulse Rate 95     Resp 20     Temp 98.1 F (36.7 C)     Temp Source Oral  SpO2 96 %     Weight      Height      Head Circumference      Peak Flow      Pain Score      Pain Loc      Pain Education      Exclude from Growth Chart    No data found.  Updated Vital Signs BP (!) 140/79 (BP Location: Left Arm)   Pulse 95   Temp 98.1 F (36.7 C) (Oral)   Resp 20   SpO2 96%   Visual Acuity Right Eye Distance:   Left Eye Distance:   Bilateral Distance:    Right Eye Near:   Left Eye Near:    Bilateral Near:     Physical Exam Vitals (Initially after walking to the exam room, her O2 sat on room air was 87 to 88%.  It would come up to 90% on room air when she rested, but could go down if she was talking.) reviewed.  Constitutional:      Appearance: She is not ill-appearing, toxic-appearing or diaphoretic.     Comments: Possibly some mild air hunger on exam  HENT:     Mouth/Throat:     Mouth: Mucous membranes are moist.  Eyes:     Extraocular Movements: Extraocular movements intact.     Conjunctiva/sclera: Conjunctivae normal.     Pupils: Pupils are equal, round, and reactive to light.  Cardiovascular:     Rate and Rhythm: Normal rate and regular rhythm.  Pulmonary:     Breath sounds: No stridor. No wheezing, rhonchi or rales.  Musculoskeletal:     Cervical back: Neck supple.  Lymphadenopathy:     Cervical: No cervical adenopathy.  Neurological:     Mental Status: She is alert.      UC Treatments / Results  Labs (all labs ordered are listed, but only abnormal results are displayed) Labs Reviewed - No data to  display  EKG   Radiology No results found.  Procedures Procedures (including critical care time)  Medications Ordered in UC Medications - No data to display  Initial Impression / Assessment and Plan / UC Course  I have reviewed the triage vital signs and the nursing notes.  Pertinent labs & imaging results that were available during my care of the patient were reviewed by me and considered in my medical decision making (see chart for details).      I have asked the patient and her daughter to proceed to the emergency room for further evaluation.They will go by private car Final Clinical Impressions(s) / UC Diagnoses   Final diagnoses:  Dyspnea, unspecified type  Hypoxia     Discharge Instructions      To the ER     ED Prescriptions   None    PDMP not reviewed this encounter.   Zenia Resides, MD 04/01/23 [phone]

## 2023-04-01 NOTE — Telephone Encounter (Signed)
 Copied from CRM [id number]. Topic: Clinical - Medical Advice >> Apr 01, 2023 12:43 PM Priscille Loveless wrote: Reason for CRM: medical clearance for surgery/ pt went for presurgery and her oxygen level was in the 80's and they need clearance to proceed with surgery. Her surgery is scheduled for Wednesday March 19th. I dont show anything on the schedule, can she be worked in? Please call patient and advise as well as, Toniann Fail at Dr Corliss Skains at [phone].

## 2023-04-01 NOTE — Telephone Encounter (Signed)
 Patient upset that she was not able to get in to see  PCP today. Patient was advised to go to ED. But she declined going.

## 2023-04-01 NOTE — Progress Notes (Signed)
 Patient here for lab work and EKG. pulse O2 /sat -89-90% after walking from lobby to lab chair. Patient states is SOB. After pulse O2 sat-93-94 %. Patient states is confused 2-3 times a week. Patient was on Home O2 but 4 months ago stopped using it stating that someone told her she didn't need it anymore. Dr. Stephannie Peters reviewed case, states will have to be evaluated by PCP for oxygen use and will need to reschedule at [facility]. Left message with [facility].

## 2023-04-02 ENCOUNTER — Encounter

## 2023-04-02 ENCOUNTER — Ambulatory Visit: Payer: Self-pay | Admitting: Internal Medicine

## 2023-04-02 ENCOUNTER — Encounter (HOSPITAL_COMMUNITY): Payer: Self-pay | Admitting: Internal Medicine

## 2023-04-02 DIAGNOSIS — J441 Chronic obstructive pulmonary disease with (acute) exacerbation: Principal | ICD-10-CM

## 2023-04-02 DIAGNOSIS — J9611 Chronic respiratory failure with hypoxia: Secondary | ICD-10-CM

## 2023-04-02 DIAGNOSIS — Z8709 Personal history of other diseases of the respiratory system: Secondary | ICD-10-CM

## 2023-04-02 DIAGNOSIS — I10 Essential (primary) hypertension: Secondary | ICD-10-CM | POA: Diagnosis not present

## 2023-04-02 DIAGNOSIS — J9601 Acute respiratory failure with hypoxia: Secondary | ICD-10-CM | POA: Diagnosis not present

## 2023-04-02 DIAGNOSIS — E876 Hypokalemia: Secondary | ICD-10-CM | POA: Diagnosis not present

## 2023-04-02 DIAGNOSIS — E7849 Other hyperlipidemia: Secondary | ICD-10-CM | POA: Diagnosis not present

## 2023-04-02 DIAGNOSIS — E119 Type 2 diabetes mellitus without complications: Secondary | ICD-10-CM

## 2023-04-02 DIAGNOSIS — D0512 Intraductal carcinoma in situ of left breast: Secondary | ICD-10-CM

## 2023-04-02 DIAGNOSIS — K589 Irritable bowel syndrome without diarrhea: Secondary | ICD-10-CM | POA: Diagnosis not present

## 2023-04-02 DIAGNOSIS — R7989 Other specified abnormal findings of blood chemistry: Secondary | ICD-10-CM | POA: Diagnosis not present

## 2023-04-02 LAB — CBG MONITORING, ED
Glucose-Capillary: 198 mg/dL — ABNORMAL HIGH (ref 70–99)
Glucose-Capillary: 151 mg/dL — ABNORMAL HIGH (ref 70–99)
Glucose-Capillary: 184 mg/dL — ABNORMAL HIGH (ref 70–99)

## 2023-04-02 LAB — CBC
HCT: 46.6 % — ABNORMAL HIGH (ref 36.0–46.0)
Hemoglobin: 14.9 g/dL (ref 12.0–15.0)
MCH: 29.7 pg (ref 26.0–34.0)
MCHC: 32 g/dL (ref 30.0–36.0)
MCV: 93 fL (ref 80.0–100.0)
Platelets: 215 10*3/uL (ref 150–400)
RBC: 5.01 MIL/uL (ref 3.87–5.11)
RDW: 12.9 % (ref 11.5–15.5)
WBC: 5.1 10*3/uL (ref 4.0–10.5)
nRBC: 0 % (ref 0.0–0.2)

## 2023-04-02 LAB — COMPREHENSIVE METABOLIC PANEL
ALT: 25 U/L (ref 0–44)
AST: 23 U/L (ref 15–41)
Albumin: 3.7 g/dL (ref 3.5–5.0)
Alkaline Phosphatase: 38 U/L (ref 38–126)
Anion gap: 9 (ref 5–15)
BUN: 7 mg/dL — ABNORMAL LOW (ref 8–23)
CO2: 23 mmol/L (ref 22–32)
Calcium: 10.8 mg/dL — ABNORMAL HIGH (ref 8.9–10.3)
Chloride: 106 mmol/L (ref 98–111)
Creatinine, Ser: 0.5 mg/dL (ref 0.44–1.00)
GFR, Estimated: >60 mL/min (ref >60)
Glucose, Bld: 193 mg/dL — ABNORMAL HIGH (ref 70–99)
Potassium: 3.3 mmol/L — ABNORMAL LOW (ref 3.5–5.1)
Sodium: 138 mmol/L (ref 135–145)
Total Bilirubin: 0.6 mg/dL (ref 0.0–1.2)
Total Protein: 7.2 g/dL (ref 6.5–8.1)

## 2023-04-02 LAB — RESP PANEL BY RT-PCR (RSV, FLU A&B, COVID)  RVPGX2
Influenza A by PCR: NEGATIVE
Influenza B by PCR: NEGATIVE
Resp Syncytial Virus by PCR: NEGATIVE
SARS Coronavirus 2 by RT PCR: NEGATIVE

## 2023-04-02 LAB — GLUCOSE, CAPILLARY
Glucose-Capillary: 139 mg/dL — ABNORMAL HIGH (ref 70–99)
Glucose-Capillary: 199 mg/dL — ABNORMAL HIGH (ref 70–99)

## 2023-04-02 MED ORDER — IPRATROPIUM-ALBUTEROL 0.5-2.5 (3) MG/3ML IN SOLN
3.0000 mL | Freq: Four times a day (QID) | RESPIRATORY_TRACT | Status: AC
Start: 2023-04-02 — End: 2023-04-03
  Administered 2023-04-02 – 2023-04-03 (×4): 3 mL via RESPIRATORY_TRACT
  Filled 2023-04-02 (×4): qty 3

## 2023-04-02 MED ORDER — LISINOPRIL 20 MG PO TABS
20.0000 mg | ORAL_TABLET | Freq: Every day | ORAL | Status: DC
  Administered 2023-04-02 – 2023-04-03 (×2): 20 mg via ORAL
  Filled 2023-04-02: qty 2
  Filled 2023-04-02: qty 1

## 2023-04-02 MED ORDER — TIZANIDINE HCL 4 MG PO TABS: 2.0000 mg | ORAL_TABLET | Freq: Four times a day (QID) | ORAL | Status: DC | PRN

## 2023-04-02 MED ORDER — PANTOPRAZOLE SODIUM 40 MG PO TBEC
40.0000 mg | DELAYED_RELEASE_TABLET | Freq: Every day | ORAL | Status: DC
  Administered 2023-04-02 – 2023-04-03 (×2): 40 mg via ORAL
  Filled 2023-04-02 (×2): qty 1

## 2023-04-02 MED ORDER — SODIUM CHLORIDE 0.9 % IV SOLN
500.0000 mg | INTRAVENOUS | Status: DC
  Administered 2023-04-02: 500 mg via INTRAVENOUS
  Filled 2023-04-02: qty 5

## 2023-04-02 MED ORDER — SODIUM CHLORIDE 0.9% FLUSH
3.0000 mL | Freq: Two times a day (BID) | INTRAVENOUS | Status: DC
  Administered 2023-04-02: 3 mL via INTRAVENOUS

## 2023-04-02 MED ORDER — SODIUM CHLORIDE 0.9 % IV SOLN: 250.0000 mL | INTRAVENOUS | Status: AC | PRN

## 2023-04-02 MED ORDER — FLUOXETINE HCL 20 MG PO CAPS
40.0000 mg | ORAL_CAPSULE | Freq: Every day | ORAL | Status: DC
  Administered 2023-04-02 – 2023-04-03 (×2): 40 mg via ORAL
  Filled 2023-04-02 (×2): qty 2

## 2023-04-02 MED ORDER — DEXAMETHASONE SODIUM PHOSPHATE 10 MG/ML IJ SOLN
6.0000 mg | Freq: Every day | INTRAMUSCULAR | Status: DC
  Administered 2023-04-02: 6 mg via INTRAVENOUS
  Filled 2023-04-02: qty 1

## 2023-04-02 MED ORDER — ALBUTEROL SULFATE (2.5 MG/3ML) 0.083% IN NEBU
5.0000 mg/h | INHALATION_SOLUTION | RESPIRATORY_TRACT | Status: DC
  Administered 2023-04-02: 5 mg/h via RESPIRATORY_TRACT
  Filled 2023-04-02: qty 6

## 2023-04-02 MED ORDER — ONDANSETRON HCL 4 MG PO TABS: 4.0000 mg | ORAL_TABLET | Freq: Four times a day (QID) | ORAL | Status: DC | PRN

## 2023-04-02 MED ORDER — LINACLOTIDE 145 MCG PO CAPS
145.0000 ug | ORAL_CAPSULE | Freq: Every day | ORAL | Status: DC
Start: 2023-04-02 — End: 2023-04-03
  Administered 2023-04-03: 145 ug via ORAL
  Filled 2023-04-02 (×2): qty 1

## 2023-04-02 MED ORDER — AMLODIPINE BESYLATE 10 MG PO TABS
10.0000 mg | ORAL_TABLET | Freq: Every day | ORAL | Status: DC
  Administered 2023-04-02 – 2023-04-03 (×2): 10 mg via ORAL
  Filled 2023-04-02: qty 1
  Filled 2023-04-02: qty 2

## 2023-04-02 MED ORDER — IPRATROPIUM BROMIDE 0.02 % IN SOLN
0.5000 mg | Freq: Once | RESPIRATORY_TRACT | Status: AC
  Administered 2023-04-02: 0.5 mg via RESPIRATORY_TRACT
  Filled 2023-04-02: qty 2.5

## 2023-04-02 MED ORDER — UMECLIDINIUM BROMIDE 62.5 MCG/ACT IN AEPB
1.0000 | INHALATION_SPRAY | Freq: Every day | RESPIRATORY_TRACT | Status: DC
  Administered 2023-04-02 – 2023-04-03 (×2): 1 via RESPIRATORY_TRACT
  Filled 2023-04-02: qty 7

## 2023-04-02 MED ORDER — INSULIN ASPART 100 UNIT/ML IJ SOLN
0.0000 [IU] | Freq: Every day | INTRAMUSCULAR | Status: DC
  Filled 2023-04-02: qty 0.05

## 2023-04-02 MED ORDER — POTASSIUM CHLORIDE 20 MEQ PO PACK
40.0000 meq | PACK | Freq: Once | ORAL | Status: AC
  Administered 2023-04-02: 40 meq via ORAL
  Filled 2023-04-02: qty 2

## 2023-04-02 MED ORDER — ONDANSETRON HCL 4 MG/2ML IJ SOLN: 4.0000 mg | Freq: Four times a day (QID) | INTRAMUSCULAR | Status: DC | PRN

## 2023-04-02 MED ORDER — SODIUM CHLORIDE 0.9% FLUSH
3.0000 mL | Freq: Two times a day (BID) | INTRAVENOUS | Status: DC
  Administered 2023-04-02 – 2023-04-03 (×4): 3 mL via INTRAVENOUS

## 2023-04-02 MED ORDER — INSULIN ASPART 100 UNIT/ML IJ SOLN
0.0000 [IU] | Freq: Three times a day (TID) | INTRAMUSCULAR | Status: DC
  Administered 2023-04-02 (×2): 1 [IU] via SUBCUTANEOUS
  Filled 2023-04-02: qty 0.06

## 2023-04-02 MED ORDER — AZITHROMYCIN 250 MG PO TABS
500.0000 mg | ORAL_TABLET | Freq: Every day | ORAL | Status: DC
  Administered 2023-04-02: 500 mg via ORAL
  Filled 2023-04-02: qty 2

## 2023-04-02 MED ORDER — MAGNESIUM SULFATE 2 GM/50ML IV SOLN
2.0000 g | Freq: Once | INTRAVENOUS | Status: AC
  Administered 2023-04-02: 2 g via INTRAVENOUS
  Filled 2023-04-02: qty 50

## 2023-04-02 MED ORDER — ASPIRIN 81 MG PO CHEW
81.0000 mg | CHEWABLE_TABLET | Freq: Every day | ORAL | Status: DC
  Administered 2023-04-02: 81 mg via ORAL
  Filled 2023-04-02: qty 1

## 2023-04-02 MED ORDER — CARVEDILOL 12.5 MG PO TABS
12.5000 mg | ORAL_TABLET | Freq: Two times a day (BID) | ORAL | Status: DC
  Administered 2023-04-02 – 2023-04-03 (×3): 12.5 mg via ORAL
  Filled 2023-04-02 (×3): qty 1

## 2023-04-02 MED ORDER — FLUTICASONE FUROATE-VILANTEROL 100-25 MCG/ACT IN AEPB
1.0000 | INHALATION_SPRAY | Freq: Every day | RESPIRATORY_TRACT | Status: DC
  Administered 2023-04-03: 1 via RESPIRATORY_TRACT
  Filled 2023-04-02: qty 28

## 2023-04-02 MED ORDER — SODIUM CHLORIDE 0.9% FLUSH: 3.0000 mL | INTRAVENOUS | Status: DC | PRN

## 2023-04-02 MED ORDER — ENOXAPARIN SODIUM 40 MG/0.4ML IJ SOSY
40.0000 mg | PREFILLED_SYRINGE | INTRAMUSCULAR | Status: DC
  Administered 2023-04-02 – 2023-04-03 (×2): 40 mg via SUBCUTANEOUS
  Filled 2023-04-02 (×2): qty 0.4

## 2023-04-02 MED ORDER — ASPIRIN 81 MG PO TBEC
81.0000 mg | DELAYED_RELEASE_TABLET | Freq: Every day | ORAL | Status: DC
  Administered 2023-04-02 – 2023-04-03 (×2): 81 mg via ORAL
  Filled 2023-04-02 (×2): qty 1

## 2023-04-02 MED ORDER — ATORVASTATIN CALCIUM 40 MG PO TABS
80.0000 mg | ORAL_TABLET | Freq: Every day | ORAL | Status: DC
  Administered 2023-04-02 – 2023-04-03 (×2): 80 mg via ORAL
  Filled 2023-04-02 (×2): qty 2

## 2023-04-02 MED ORDER — HYDROCHLOROTHIAZIDE 12.5 MG PO TABS
12.5000 mg | ORAL_TABLET | Freq: Every day | ORAL | Status: DC
  Administered 2023-04-02 – 2023-04-03 (×2): 12.5 mg via ORAL
  Filled 2023-04-02 (×2): qty 1

## 2023-04-02 MED ORDER — IPRATROPIUM-ALBUTEROL 0.5-2.5 (3) MG/3ML IN SOLN
3.0000 mL | RESPIRATORY_TRACT | Status: DC | PRN
  Administered 2023-04-02: 3 mL via RESPIRATORY_TRACT

## 2023-04-02 MED ORDER — LISINOPRIL-HYDROCHLOROTHIAZIDE 20-12.5 MG PO TABS: 1.0000 | ORAL_TABLET | Freq: Every day | ORAL | Status: DC

## 2023-04-02 MED ORDER — ACETAMINOPHEN 325 MG PO TABS: 650.0000 mg | ORAL_TABLET | Freq: Four times a day (QID) | ORAL | Status: DC | PRN

## 2023-04-02 MED ORDER — ACETAMINOPHEN 650 MG RE SUPP: 650.0000 mg | Freq: Four times a day (QID) | RECTAL | Status: DC | PRN

## 2023-04-02 NOTE — ED Notes (Signed)
 Pt assisted to [facility]

## 2023-04-02 NOTE — H&P (Signed)
 . History and Physical    [name]:[id number] DOB: 10-Oct-1955 DOA: 04/01/2023  PCP: Marcine Matar, MD   Patient coming from: Home   Chief Complaint:  Chief Complaint  Patient presents with   Chest Pain   Shortness of Breath   ED TRIAGE note:  Patient is here with SOB- that started several months ago. Patient states she was scheduled for pre-op today and advised to go ED.            HPI:  [name] is a 68 y.o. 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  Patient reported she has chronic shortness of breath but it has been progressively getting worse for last couple of days.  Reported developed intermittent chest pain in the center of her chest.  She is also having worsening cough with worsening breathing.  Denies any palpitation, and lower extremity swelling. Denies any aggravating factor for chest pain and there is no relieving factor.  Denies any fever and chill. Patient was scheduled for mastectomy today.  She was at preop appointment this morning and noted to have hypoxia with O2 sat dropped to 80% with ambulation.  She was referred to the ED for evaluation.   At presentation to ED patient blood pressure is elevated 174/81 and currently O2 sat 95% on 2 L oxygen. Pending respiratory panel. Elevated troponin 18 and 18 without any delta change. EKG showing sinus rhythm heart rate 74, prolonged PR interval, incomplete left bundle blanch block and left ventricular hypertrophy patter.  CBC unremarkable. CMP showing low potassium 3.2.  Calcium 10.7 otherwise unremarkable.  CTA chest no evidence of pulmonary embolism or acute process.  Emphysema.  4 mm left upper lobe nodule.  Chest x-ray showing  evidence of emphysema.  In the ED for has been treated with albuterol nebulizer, mag sulfate and Decadron 10 mg.  Hospitalist has been consulted for further evaluation management of acute hypoxic respiratory failure in the setting of COPD exacerbation.  Significant labs in the ED: Lab Orders         Resp panel by RT-PCR (RSV, Flu A&B, Covid) Anterior Nasal Swab         Basic metabolic panel         CBC         Comprehensive metabolic panel         CBC       Review of Systems:  Review of Systems  Constitutional:  Negative for chills, fever, malaise/fatigue and weight loss.  Respiratory:  Positive for cough, sputum production and shortness of breath. Negative for hemoptysis and wheezing.   Cardiovascular:  Negative for chest pain, palpitations, claudication and leg swelling.  Gastrointestinal:  Negative for abdominal pain, heartburn, nausea and vomiting.  Neurological:  Negative for dizziness and headaches.  Psychiatric/Behavioral:  The patient is not nervous/anxious.   All other systems reviewed and are negative.   Past Medical History:  Diagnosis Date   Anxiety    Asthma    Breast cancer (HCC)    Chest pain    COPD (chronic obstructive pulmonary disease) (HCC)    Depression    DM type 2 (diabetes mellitus, type 2) (HCC)    Hypertension    Pneumonia    Shortness of breath dyspnea  Past Surgical History:  Procedure Laterality Date   BREAST BIOPSY Left 03/11/2023   [place] LT BREAST BX W LOC DEV 1ST LESION IMG BX SPEC US GUIDE 03/11/2023 [facility] MAMMOGRAPHY   TEE WITHOUT CARDIOVERSION N/A 08/24/2014   Procedure: TRANSESOPHAGEAL ECHOCARDIOGRAM (TEE);  Surgeon: Yates Decamp, MD;  Location: [facility] ENDOSCOPY;  Service: Cardiovascular;  Laterality: N/A;   TOTAL ABDOMINAL HYSTERECTOMY  01/15/2005     reports that she quit smoking about 15 years ago. Her smoking use included cigarettes. She started smoking about 53 years ago. She has a 38 pack-year smoking history. She has never used  smokeless tobacco. She reports that she does not drink alcohol and does not use drugs.  Allergies  Allergen Reactions   Flonase [Fluticasone Propionate] Other (See Comments)    Nose bleeds    Family History  Problem Relation Age of Onset   Allergies Mother    Allergies Daughter    Asthma Son        had as a child   BRCA 1/2 Neg Hx    Breast cancer Neg Hx     Prior to Admission medications   Medication Sig Start Date End Date Taking? Authorizing Provider  albuterol (PROAIR HFA) 108 (90 Base) MCG/ACT inhaler Inhale 1-2 puffs into the lungs every 6 (six) hours as needed for wheezing or shortness of breath. 12/17/22  Yes Marcine Matar, MD  amLODipine (NORVASC) 10 MG tablet Take 1 tablet (10 mg total) by mouth daily. 12/31/22  Yes Marcine Matar, MD  aspirin 81 MG tablet Take 81 mg by mouth daily.   Yes [provider]  atorvastatin (LIPITOR) 80 MG tablet Take 1 tablet (80 mg total) by mouth daily. 12/17/22  Yes Marcine Matar, MD  Camphor-Eucalyptus-Menthol (VICKS VAPORUB EX) Apply 1 application topically 2 (two) times daily as needed (breathe and soreness).   Yes [provider]  carvedilol (COREG) 12.5 MG tablet Take 1 tablet (12.5 mg total) by mouth 2 (two) times daily with a meal. 12/17/22  Yes Marcine Matar, MD  esomeprazole (NEXIUM) 40 MG capsule Take 1 capsule (40 mg total) by mouth daily. 12/17/22  Yes Marcine Matar, MD  FLUoxetine (PROZAC) 40 MG capsule Take 1 capsule (40 mg total) by mouth daily. 12/17/22  Yes Marcine Matar, MD  Fluticasone-Umeclidin-Vilant (TRELEGY ELLIPTA) 100-62.5-25 MCG/ACT AEPB Take 1 puff by mouth daily. 02/20/23  Yes Marcine Matar, MD  linaclotide [facility]) 145 MCG CAPS capsule Take 1 capsule (145 mcg total) by mouth daily before breakfast. 02/25/23  Yes Marcine Matar, MD  lisinopril-hydrochlorothiazide (ZESTORETIC) 20-12.5 MG tablet Take 1 tablet by mouth daily. 12/17/22  Yes Marcine Matar, MD  metFORMIN  (GLUCOPHAGE-XR) 500 MG 24 hr tablet TAKE 1 TABLET BY MOUTH TWICE  DAILY WITH MEALS 01/10/23  Yes Marcine Matar, MD  montelukast (SINGULAIR) 10 MG tablet Take 1 tablet (10 mg total) by mouth at bedtime. 12/17/22  Yes Marcine Matar, MD  nitroGLYCERIN (NITROSTAT) 0.3 MG SL tablet DISSOLVE 1 TABLET UNDER THE  TONGUE EVERY 5 MINUTES AS NEEDED FOR CHEST PAIN. MAX OF 3 TABLETS IN 15 MINUTES. CALL 911 IF PAIN  PERSISTS. 04/02/22  Yes Ngetich, Dinah C, NP  tiZANidine (ZANAFLEX) 2 MG tablet Take 2 mg by mouth every 6 (six) hours as needed for muscle spasms. 12/01/19  Yes [provider]  traZODone (DESYREL) 50 MG tablet Take 1 tablet (50 mg total) by mouth at bedtime as needed. Patient taking differently: Take 50  mg by mouth at bedtime as needed for sleep. 12/17/22  Yes Marcine Matar, MD  Continuous Glucose Receiver (FREESTYLE LIBRE 2 READER) DEVI Use to check glucose continuously throughout the day. E11.69 12/17/22   Marcine Matar, MD  Continuous Glucose Sensor (FREESTYLE LIBRE 2 SENSOR) MISC Place 1 sensor on the skin every 14 days. Use to check glucose continuously. E11.69 12/17/22   Marcine Matar, MD  Semaglutide, 1 MG/DOSE, 4 MG/3ML SOPN Inject 1 mg as directed once a week. 02/08/23   Marcine Matar, MD     Physical Exam: Vitals:   04/01/23 1834 04/01/23 2259 04/02/23 0244 04/02/23 0330  BP:  (!) 179/83 (!) 174/81 (!) 141/78  Pulse:  66 67 71  Resp:  17 16   Temp:  (!) 97.5 F (36.4 C) (!) 97.5 F (36.4 C)   TempSrc:  Oral Oral   SpO2:  93% 95% 97%  Weight: 79.8 kg     Height: 5\' 7"  (1.702 m)       Physical Exam Constitutional:      Appearance: She is not ill-appearing.  Cardiovascular:     Rate and Rhythm: Normal rate and regular rhythm.     Heart sounds: Normal heart sounds.  Pulmonary:     Effort: Pulmonary effort is normal. No accessory muscle usage or respiratory distress.     Breath sounds: No decreased breath sounds, wheezing, rhonchi or rales.   Abdominal:     Palpations: Abdomen is soft.  Musculoskeletal:     Cervical back: Neck supple.  Skin:    Capillary Refill: Capillary refill takes less than 2 seconds.  Neurological:     Mental Status: She is alert and oriented to person, place, and time.  Psychiatric:        Mood and Affect: Mood normal. Mood is not anxious.      Labs on Admission: I have personally reviewed following labs and imaging studies  CBC: Recent Labs  Lab 04/01/23 1845  WBC 5.5  HGB 14.3  HCT 43.6  MCV 92.0  PLT 211   Basic Metabolic Panel: Recent Labs  Lab 04/01/23 0834 04/01/23 1845  NA 140 138  K 4.3 3.2*  CL 110 108  CO2 21* 23  GLUCOSE 107* 120*  BUN 5* 7*  CREATININE 0.69 0.53  CALCIUM 10.9* 10.7*   GFR: Estimated Creatinine Clearance: 74.2 mL/min (by C-G formula based on SCr of 0.53 mg/dL). Liver Function Tests: No results for input(s): "AST", "ALT", "ALKPHOS", "BILITOT", "PROT", "ALBUMIN" in the last 168 hours. No results for input(s): "LIPASE", "AMYLASE" in the last 168 hours. No results for input(s): "AMMONIA" in the last 168 hours. Coagulation Profile: No results for input(s): "INR", "PROTIME" in the last 168 hours. Cardiac Enzymes: Recent Labs  Lab 04/01/23 1845 04/01/23 2212  TROPONINIHS 18* 18*   BNP (last 3 results) No results for input(s): "BNP" in the last 8760 hours. HbA1C: No results for input(s): "HGBA1C" in the last 72 hours. CBG: No results for input(s): "GLUCAP" in the last 168 hours. Lipid Profile: No results for input(s): "CHOL", "HDL", "LDLCALC", "TRIG", "CHOLHDL", "LDLDIRECT" in the last 72 hours. Thyroid Function Tests: No results for input(s): "TSH", "T4TOTAL", "FREET4", "T3FREE", "THYROIDAB" in the last 72 hours. Anemia Panel: No results for input(s): "VITAMINB12", "FOLATE", "FERRITIN", "TIBC", "IRON", "RETICCTPCT" in the last 72 hours. Urine analysis:    Component Value Date/Time   COLORURINE YELLOW 08/01/2008 1550   APPEARANCEUR HAZY (A)  08/01/2008 1550   LABSPEC 1.018 08/01/2008  1550   PHURINE 6.5 08/01/2008 1550   GLUCOSEU NEGATIVE 08/01/2008 1550   HGBUR NEGATIVE 08/01/2008 1550   BILIRUBINUR NEGATIVE 08/01/2008 1550   KETONESUR NEGATIVE 08/01/2008 1550   PROTEINUR NEGATIVE 08/01/2008 1550   UROBILINOGEN 1.0 08/01/2008 1550   NITRITE NEGATIVE 08/01/2008 1550   LEUKOCYTESUR  08/01/2008 1550    NEGATIVE MICROSCOPIC NOT DONE ON URINES WITH NEGATIVE PROTEIN, BLOOD, LEUKOCYTES, NITRITE, OR GLUCOSE <1000 mg/dL.    Radiological Exams on Admission: I have personally reviewed images CT Angio Chest PE W/Cm &/Or Wo Cm Result Date: 04/01/2023 CLINICAL DATA:  Pulmonary embolism suspected, high probability. Chest pain and shortness of breath. EXAM: CT ANGIOGRAPHY CHEST WITH CONTRAST TECHNIQUE: Multidetector CT imaging of the chest was performed using the standard protocol during bolus administration of intravenous contrast. Multiplanar CT image reconstructions and MIPs were obtained to evaluate the vascular anatomy. RADIATION DOSE REDUCTION: This exam was performed according to the departmental dose-optimization program which includes automated exposure control, adjustment of the mA and/or kV according to patient size and/or use of iterative reconstruction technique. CONTRAST:  75mL OMNIPAQUE IOHEXOL 350 MG/ML SOLN COMPARISON:  02/03/2015. FINDINGS: Cardiovascular: The heart is mildly enlarged and there is a trace pericardial effusion. Three-vessel coronary artery calcifications are noted. There is atherosclerotic calcification of the aorta without evidence of aneurysm. The pulmonary trunk is normal in caliber. No pulmonary embolism is seen. Mediastinum/Nodes: No mediastinal lymphadenopathy is seen. There is a nonspecific prominent lymph node at the right hilum measuring 1.1 cm. No axillary lymphadenopathy. The thyroid gland, trachea, and esophagus are within normal limits. There is a small hiatal hernia. Lungs/Pleura: Emphysematous changes are  present in the lungs. Atelectasis is present bilaterally. No effusion or pneumothorax is seen. There is a 4 mm subpleural nodule in the left upper lobe, axial image 52. Upper Abdomen: There is a left adrenal nodule measuring 2.7 cm with attenuation and 7 Hounsfield units, compatible with adrenal adenoma. No acute abnormality. Musculoskeletal: Degenerative changes are present in the thoracic spine. No acute osseous abnormality. Review of the MIP images confirms the above findings. IMPRESSION: 1. No evidence of pulmonary embolism or other acute process. 2. Emphysema. 3. 4 mm left upper lobe pulmonary nodule. No follow-up needed if patient is low-risk.This recommendation follows the consensus statement: Guidelines for Management of Incidental Pulmonary Nodules Detected on CT Images: From the Fleischner Society 2017; Radiology 2017; 284:228-243. 4. Left adrenal adenoma. 5. Coronary artery calcifications and aortic atherosclerosis. Electronically Signed   By: Thornell Sartorius M.D.   On: 04/01/2023 23:00   DG Chest 2 View Result Date: 04/01/2023 CLINICAL DATA:  Chest pain, short of breath, hypoxia EXAM: CHEST - 2 VIEW COMPARISON:  11/20/2014 FINDINGS: Frontal and lateral views of the chest demonstrate an unremarkable cardiac silhouette. Lungs are hyperinflated, lungs are hyperinflated with background parenchymal scarring consistent with emphysema. No acute airspace disease, effusion, or pneumothorax. No acute bony abnormalities. IMPRESSION: 1. Emphysema.  No acute airspace disease. Electronically Signed   By: Sharlet Salina M.D.   On: 04/01/2023 20:17     EKG: My personal interpretation of EKG shows: Sinus rhythm heart rate 74.  There is no ST anterior abnormality.  8, left bundle branch block    Assessment/Plan: Principal Problem:   COPD with acute exacerbation (HCC) Active Problems:   Elevated troponin   Acute respiratory failure with hypoxia (HCC)   Ductal carcinoma in situ (DCIS) of left breast    Hypokalemia   Benign essential HTN   Hyperlipidemia   Non-insulin dependent  type 2 diabetes mellitus (HCC)   History of COPD   Irritable bowel syndrome    Assessment and Plan: COPD with acute exacerbation Acute hypoxic respiratory failure secondary to COPD exacerbation -Patient has been referred from surgical unit.  Patient has been scheduled for lumpectomy with radioactive seed localization today however during the preop evaluation patient found to have acute hypoxic respiratory failure.  O2 sat dropped to low 80s in room air while patient was waiting in the lobby. -Patient is complaining of nonproductive cough and shortness of breath.  Denies any lower extremity swelling, fever, chill and recent known exposure to COVID. -In the ED patient has been placed on 2 L nasal cannula oxygen currently O2 sat in between 91 to 95%.  Otherwise hemodynamically stable. - CBC no evidence of leukocytosis.  BMP showing low potassium 3.2 and slight elevated calcium 10.7. -Pending respiratory panel -Chest x-ray no active disease process except emphysema. - CTA chest no evidence of PE.  Emphysema.  4 mm left upper lobe lung nodule. - In the ED patient has been treated with Decadron and continuous albuterol nebulizer and DuoNeb nebulizer. -Physical exam no evidence of wheezing - Continue supplemental oxygen and wean down as patient tolerates.  Goal to keep O2 sat above 92% - Patient has history of allergy to Solu-Medrol - Continue IV Decadron 6 mg daily for 4 days and azithromycin 500 mg daily for 4 days. -Currently due Breo Ellipta once daily and encouraged Ellipta once daily. -Continue supportive care.  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  Patient supposed to be having lumpectomy with radiation seed implantation surgery today 3/18 which has been canceled due to acute hypoxic respiratory failure and COPD  exacerbation. -Please inform patient oncologist at daytime regarding patient's hospitalization.   Elevated troponin-secondary demand ischemia Noncardiac related chest pain -Patient is complaining about mid substernal chest pressure for last few days progressively getting worse with associated shortness of breath. - Blood troponin without any delta change. -Sinus rhythm heart rate 74.  There is no ST anterior abnormality.  8, left bundle branch block. -Elevated troponin secondary to demand ischemia in the context of COPD exacerbation. -Noncardiac related chest pain in the setting of COPD exacerbation and hypoxic aspiratory failure. -Continue cardiac monitoring.   Hypokalemia -Low potassium 3.1 replating with oral KCl.  Benign essential hypertension -Continue amlodipine 10 mg daily, Coreg 12.5 mg twice daily, lisinopril and hydrochlorothiazide once daily.   Non-insulin-dependent DM type II -In hospital setting holding metformin and semaglutide.  Continue sliding scale insulin.  Irritable bowel syndrome -Continue Linzess.  Generalized anxiety disorder -Continue Prozac.  Insomnia Holding trazodone in the setting of acute high-pressure respiratory failure.  Chronic muscle spasm Continue Zanaflex   DVT prophylaxis:  Lovenox Code Status:  Full Code Diet: Heart healthy carb modified diet Family Communication: No family member present at bedside now. Disposition Plan: Continue to monitor improvement of shortness of breath with breathing treatment and steroid. Consults: Respiratory Admission status:   Observation, Telemetry bed  Severity of Illness: The appropriate patient status for this patient is OBSERVATION. Observation status is judged to be reasonable and necessary in order to provide the required intensity of service to ensure the patient's safety. The patient's presenting symptoms, physical exam findings, and initial radiographic and laboratory data in the context of their  medical condition is felt to place them at decreased risk for further clinical deterioration. Furthermore, it is anticipated that the patient will  be medically stable for discharge from the hospital within 2 midnights of admission.     Tereasa Coop, MD [facility]  How to contact the [facility] Attending or Consulting provider 7A - 7P or covering provider during after hours 7P -7A, for this patient.  Check the care team in [facility] and look for a) attending/consulting [facility] provider listed and b) the [facility] team listed Log into [email or web address] and use [place]'s universal password to access. If you do not have the password, please contact the hospital operator. Locate the [facility] provider you are looking for under [facility] and page to a number that you can be directly reached. If you still have difficulty reaching the provider, please page the [facility] (Director on Call) for the Hospitalists listed on amion for assistance.  04/02/2023, 3:48 AM

## 2023-04-02 NOTE — Progress Notes (Signed)
  INTERVAL PROGRESS NOTE    [name]- 68 y.o. female  LOS: 0 _________________________________________________________  SUBJECTIVE: Admitted 04/01/2023 with cc of  Chief Complaint  Patient presents with   Chest Pain   Shortness of Breath   Since admission, stable, improved SOB.   OBJECTIVE: Blood pressure (!) 165/80, pulse 70, temperature (!) 97.5 F (36.4 C), temperature source Oral, resp. rate (!) 30, height 5\' 7"  (1.702 m), weight 79.8 kg, SpO2 94%.  General: NAD, pleasant, able to participate in exam Cardiac: RRR, normal heart sounds, no murmurs. 2+ radial and PT pulses bilaterally Respiratory: CTAB, normal effort, No wheezes, rales or rhonchi Abdomen: soft, nontender, nondistended, no hepatic or splenomegaly, +BS Extremities: no edema. WWP. Skin: warm and dry, no rashes noted Neuro: alert and oriented, no focal deficits Psych: Normal affect and mood   ASSESSMENT/PLAN:  I have reviewed the full H&P by Dr. Janalyn Shy, and reviewed pertinent results.   In addition: Patient states that she has previously been on oxygen at home but discontinued a few months ago and returned her oxygen equipment.   Continue breathing treatments scheduled and PRN.  Negative covid, RSV, influenza - wean to room air   Principal Problem:   COPD with acute exacerbation (HCC) Active Problems:   Elevated troponin   Acute respiratory failure with hypoxia (HCC)   Ductal carcinoma in situ (DCIS) of left breast   Hypokalemia   Benign essential HTN   Hyperlipidemia   Non-insulin dependent type 2 diabetes mellitus (HCC)   History of COPD   Irritable bowel syndrome      Leeroy Bock, DO [facility] 04/02/2023, 7:44 AM    [email or web address] Available by Epic secure chat 7AM-7PM. If 7PM-7AM, please contact night-coverage   No Charge

## 2023-04-02 NOTE — Progress Notes (Signed)
 PHARMACIST - PHYSICIAN COMMUNICATION DR:   Jamelle Rushing CONCERNING: Antibiotic IV to Oral Route Change Policy  RECOMMENDATION: This patient is receiving Zithromax by the intravenous route.  Based on criteria approved by the Pharmacy and Therapeutics Committee, the antibiotic(s) is/are being converted to the equivalent oral dose form(s).   DESCRIPTION: These criteria include: Patient being treated for a respiratory tract infection, urinary tract infection, cellulitis or clostridium difficile associated diarrhea if on metronidazole The patient is not neutropenic and does not exhibit a GI malabsorption state The patient is eating (either orally or via tube) and/or has been taking other orally administered medications for a least 24 hours The patient is improving clinically and has a Tmax < 100.5  If you have questions about this conversion, please contact the Pharmacy Department  []   [phone] )  [facility] []   [phone] )  [facility]  []   [phone] )  [facility] [x]   [phone] )  [facility]    Thank you for allowing pharmacy to be a part of this patient's care.   Lyn Henri, [facility] PharmD Candidate 04/02/2023 12:44 PM

## 2023-04-02 NOTE — Plan of Care (Signed)

## 2023-04-02 NOTE — ED Provider Notes (Signed)
 I assumed care of this patient from previous provider.  Please see their note for further details of history, exam, and MDM.   Briefly patient is a 68 y.o. female who presented for chest pain and shortness of breath.  Workup thus far has been reassuring with a negative CTA ruling out PE, pneumonia, pneumothorax, pulmonary edema pleural effusions.  Presentation was most suspicious for COPD exacerbation with hypoxia noted on ambulatory pulse ox currently receiving second round of DuoNebs.  Plan to reassess and determine need for admission versus possibility of discharge.  After second round of breathing treatment, patient was reassessed and still desatting to the low 80s on room air with ambulation.  Additional breathing treatment provided.  Magnesium ordered.  Patient admitted to medicine for further workup and management.  .Critical Care  Performed by: Nira Conn, MD Authorized by: Nira Conn, MD   Critical care provider statement:    Critical care time (minutes):  30   Critical care was necessary to treat or prevent imminent or life-threatening deterioration of the following conditions:  Respiratory failure   Critical care was time spent personally by me on the following activities:  Development of treatment plan with patient or surrogate, discussions with consultants, evaluation of patient's response to treatment, examination of patient, ordering and review of laboratory studies, ordering and review of radiographic studies, ordering and performing treatments and interventions, pulse oximetry, re-evaluation of patient's condition and review of old charts   Care discussed with: admitting provider           Nira Conn, MD 04/02/23 [phone]

## 2023-04-03 ENCOUNTER — Other Ambulatory Visit (HOSPITAL_COMMUNITY): Payer: Self-pay

## 2023-04-03 ENCOUNTER — Encounter

## 2023-04-03 ENCOUNTER — Encounter (HOSPITAL_BASED_OUTPATIENT_CLINIC_OR_DEPARTMENT_OTHER): Payer: Self-pay | Admitting: Anesthesiology

## 2023-04-03 ENCOUNTER — Encounter (HOSPITAL_BASED_OUTPATIENT_CLINIC_OR_DEPARTMENT_OTHER): Admission: RE | Payer: Self-pay | Source: Home / Self Care

## 2023-04-03 ENCOUNTER — Ambulatory Visit (HOSPITAL_BASED_OUTPATIENT_CLINIC_OR_DEPARTMENT_OTHER): Admission: RE | Admit: 2023-04-03 | Source: Home / Self Care | Admitting: Surgery

## 2023-04-03 DIAGNOSIS — J441 Chronic obstructive pulmonary disease with (acute) exacerbation: Secondary | ICD-10-CM | POA: Diagnosis not present

## 2023-04-03 DIAGNOSIS — Z01818 Encounter for other preprocedural examination: Secondary | ICD-10-CM

## 2023-04-03 LAB — GLUCOSE, CAPILLARY: Glucose-Capillary: 129 mg/dL — ABNORMAL HIGH (ref 70–99)

## 2023-04-03 SURGERY — BREAST LUMPECTOMY WITH RADIOACTIVE SEED LOCALIZATION
Anesthesia: General | Site: Breast | Laterality: Left

## 2023-04-03 MED ORDER — AZITHROMYCIN 250 MG PO TABS
ORAL_TABLET | ORAL | 0 refills | Status: DC
  Filled 2023-04-03: qty 6, fill #0
  Filled 2023-04-03: qty 4, 4d supply, fill #0

## 2023-04-03 MED ORDER — PREDNISONE 50 MG PO TABS
50.0000 mg | ORAL_TABLET | Freq: Every day | ORAL | 0 refills | Status: DC
  Filled 2023-04-03 (×2): qty 4, 4d supply, fill #0

## 2023-04-03 MED ORDER — PREDNISONE 50 MG PO TABS: 50.0000 mg | ORAL_TABLET | Freq: Every day | ORAL | Status: DC

## 2023-04-03 NOTE — TOC Transition Note (Signed)
 Transition of Care [facility]) - Discharge Note   Patient Details  Name: [name] MRN: [id number] Date of Birth: 1955/06/08  Transition of Care [facility]) CM/SW Contact:  Beckie Busing, RN Phone [phone]  04/03/2023, 11:10 AM   Clinical Narrative:    Patient with discharge orders. No TOC needs noted.     Final next level of care: Home/Self Care Barriers to Discharge: No Barriers Identified   Patient Goals and CMS Choice Patient states their goals for this hospitalization and ongoing recovery are:: Ready to get this over with and go home.   Choice offered to / list presented to : NA [facility] ownership interest in [facility].provided to::  (n/a)    Discharge Placement                       Discharge Plan and Services Additional resources added to the After Visit Summary for                  DME Arranged: N/A DME Agency: NA       HH Arranged: NA HH Agency: NA        Social Drivers of Health (SDOH) Interventions SDOH Screenings   Food Insecurity: No Food Insecurity (04/02/2023)  Housing: Low Risk  (04/02/2023)  Transportation Needs: No Transportation Needs (04/02/2023)  Utilities: Not At Risk (04/02/2023)  Alcohol Screen: Low Risk  (12/31/2022)  Depression (PHQ2-9): Low Risk  (03/20/2023)  Recent Concern: Depression (PHQ2-9) - Medium Risk (12/31/2022)  Financial Resource Strain: Low Risk  (12/31/2022)  Physical Activity: Inactive (12/31/2022)  Social Connections: Socially Isolated (04/02/2023)  Stress: No Stress Concern Present (12/31/2022)  Tobacco Use: Medium Risk (04/02/2023)  Health Literacy: Inadequate Health Literacy (12/31/2022)     Readmission Risk Interventions     No data to display

## 2023-04-03 NOTE — Progress Notes (Signed)
 SATURATION QUALIFICATIONS: (This note is used to comply with regulatory documentation for home oxygen)  Patient Saturations on Room Air at Rest = 100%  Patient Saturations on Room Air while Ambulating = 96%  Patient Saturations on 0 Liters of oxygen while Ambulating = 96%  No home oxygen requirements

## 2023-04-03 NOTE — Plan of Care (Addendum)
 Patient is alert and oriented X4, discharge information went over with pt at the bedside. Family present and will transport her to pick up her, medications at the [facility] long outpt pharmacy.  Problem: Education: Goal: Ability to describe self-care measures that may prevent or decrease complications (Diabetes Survival Skills Education) will improve Outcome: Completed/Met Goal: Individualized Educational Video(s) Outcome: Completed/Met   Problem: Coping: Goal: Ability to adjust to condition or change in health will improve Outcome: Completed/Met   Problem: Fluid Volume: Goal: Ability to maintain a balanced intake and output will improve Outcome: Completed/Met   Problem: Health Behavior/Discharge Planning: Goal: Ability to identify and utilize available resources and services will improve Outcome: Completed/Met Goal: Ability to manage health-related needs will improve Outcome: Completed/Met   Problem: Metabolic: Goal: Ability to maintain appropriate glucose levels will improve Outcome: Completed/Met   Problem: Nutritional: Goal: Maintenance of adequate nutrition will improve Outcome: Completed/Met Goal: Progress toward achieving an optimal weight will improve Outcome: Completed/Met   Problem: Skin Integrity: Goal: Risk for impaired skin integrity will decrease Outcome: Completed/Met   Problem: Tissue Perfusion: Goal: Adequacy of tissue perfusion will improve Outcome: Completed/Met   Problem: Education: Goal: Knowledge of General Education information will improve Description: Including pain rating scale, medication(s)/side effects and non-pharmacologic comfort measures Outcome: Completed/Met   Problem: Health Behavior/Discharge Planning: Goal: Ability to manage health-related needs will improve Outcome: Completed/Met   Problem: Clinical Measurements: Goal: Ability to maintain clinical measurements within normal limits will improve Outcome: Completed/Met Goal: Will  remain free from infection Outcome: Completed/Met Goal: Diagnostic test results will improve Outcome: Completed/Met Goal: Respiratory complications will improve Outcome: Completed/Met Goal: Cardiovascular complication will be avoided Outcome: Completed/Met   Problem: Activity: Goal: Risk for activity intolerance will decrease Outcome: Completed/Met   Problem: Nutrition: Goal: Adequate nutrition will be maintained Outcome: Completed/Met   Problem: Coping: Goal: Level of anxiety will decrease Outcome: Completed/Met   Problem: Elimination: Goal: Will not experience complications related to bowel motility Outcome: Completed/Met Goal: Will not experience complications related to urinary retention Outcome: Completed/Met   Problem: Pain Managment: Goal: General experience of comfort will improve and/or be controlled Outcome: Completed/Met   Problem: Safety: Goal: Ability to remain free from injury will improve Outcome: Completed/Met   Problem: Skin Integrity: Goal: Risk for impaired skin integrity will decrease Outcome: Completed/Met   Problem: Education: Goal: Knowledge of disease or condition will improve Outcome: Completed/Met Goal: Knowledge of the prescribed therapeutic regimen will improve Outcome: Completed/Met Goal: Individualized Educational Video(s) Outcome: Completed/Met   Problem: Activity: Goal: Ability to tolerate increased activity will improve Outcome: Completed/Met Goal: Will verbalize the importance of balancing activity with adequate rest periods Outcome: Completed/Met   Problem: Respiratory: Goal: Ability to maintain a clear airway will improve Outcome: Completed/Met Goal: Levels of oxygenation will improve Outcome: Completed/Met Goal: Ability to maintain adequate ventilation will improve Outcome: Completed/Met

## 2023-04-03 NOTE — Plan of Care (Signed)

## 2023-04-03 NOTE — Discharge Summary (Signed)
 Physician Discharge Summary  [name] YTK:[id number] DOB: [birth year] DOA: 04/01/2023  PCP: Marcine Matar, MD  Admit date: 04/01/2023 Discharge date: 04/03/2023  Admitted From: home Discharge disposition: home   Recommendations for Outpatient Follow-Up:   Prednisone burst   Discharge Diagnosis:   Principal Problem:   COPD with acute exacerbation (HCC) Active Problems:   Elevated troponin   Acute respiratory failure with hypoxia (HCC)   Ductal carcinoma in situ (DCIS) of left breast   Hypokalemia   Benign essential HTN   Hyperlipidemia   Non-insulin dependent type 2 diabetes mellitus (HCC)   History of COPD   Irritable bowel syndrome    Discharge Condition: Improved.  Diet recommendation: Low sodium, heart healthy.  Carbohydrate-modified.   Wound care: None.  Code status: Full.   History of Present Illness:   [name] is a 68 y.o. 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  Patient reported she has chronic shortness of breath but it has been progressively getting worse for last couple of days.  Reported developed intermittent chest pain in the center of her chest.  She is also having worsening cough with worsening breathing.  Denies any palpitation, and lower extremity swelling. Denies any aggravating factor for chest pain and there is no relieving factor.  Denies any fever and chill. Patient was scheduled for mastectomy today.  She was at preop appointment this morning and noted to have hypoxia with O2 sat dropped to 80% with ambulation.  She was referred to the ED for evaluation.   Hospital Course by Problem:   COPD with acute exacerbation Acute hypoxic respiratory failure  secondary to COPD exacerbation - Patient had been scheduled for lumpectomy with radioactive seed localization today however during the preop evaluation patient found to have acute hypoxic respiratory failure.  O2 sat dropped to low 80s in room air while patient was waiting in the lobby. -Patient is complaining of nonproductive cough and shortness of breath.  Denies any lower extremity swelling, fever, chill and recent known exposure to COVID. -In the ED patient has been placed on 2 L nasal cannula oxygen currently O2 sat in between 91 to 95%.  Otherwise hemodynamically stable. -Chest x-ray no active disease process except emphysema. - CTA chest no evidence of PE.  Emphysema.  4 mm left upper lobe lung nodule. - In the ED patient has been treated with Decadron and continuous albuterol nebulizer and DuoNeb nebulizer. 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  Patient supposed to be having lumpectomy with radiation seed implantation surgery  3/19 which has been canceled due to acute hypoxic respiratory failure and COPD exacerbation.     Elevated troponin-secondary demand ischemia Noncardiac related chest pain -resolved, troponin negative     Hypokalemia -replete   Benign essential hypertension -resume home meds     Non-insulin-dependent DM type II -resume home meds   Irritable bowel syndrome -Continue Linzess.   Generalized anxiety disorder -Continue Prozac.   Chronic muscle spasm Continue Zanaflex    Medical Consultants:  Discharge Exam:   Vitals:   04/03/23 0806 04/03/23 0854  BP: (!) 126/112   Pulse: 70   Resp:    Temp:    SpO2:  95%   Vitals:   04/03/23 0316 04/03/23 0756 04/03/23 0806 04/03/23 0854  BP: (!) 142/75 (!) 126/112 (!) 126/112    Pulse: 72 70 70   Resp: 18     Temp: 98.2 F (36.8 C)     TempSrc: Oral     SpO2: 96% 100%  95%  Weight:      Height:        General exam: Appears calm and comfortable. No wheezing   The results of significant diagnostics from this hospitalization (including imaging, microbiology, ancillary and laboratory) are listed below for reference.     Procedures and Diagnostic Studies:   CT Angio Chest PE W/Cm &/Or Wo Cm Result Date: 04/01/2023 CLINICAL DATA:  Pulmonary embolism suspected, high probability. Chest pain and shortness of breath. EXAM: CT ANGIOGRAPHY CHEST WITH CONTRAST TECHNIQUE: Multidetector CT imaging of the chest was performed using the standard protocol during bolus administration of intravenous contrast. Multiplanar CT image reconstructions and MIPs were obtained to evaluate the vascular anatomy. RADIATION DOSE REDUCTION: This exam was performed according to the departmental dose-optimization program which includes automated exposure control, adjustment of the mA and/or kV according to patient size and/or use of iterative reconstruction technique. CONTRAST:  75mL OMNIPAQUE IOHEXOL 350 MG/ML SOLN COMPARISON:  02/03/2015. FINDINGS: Cardiovascular: The heart is mildly enlarged and there is a trace pericardial effusion. Three-vessel coronary artery calcifications are noted. There is atherosclerotic calcification of the aorta without evidence of aneurysm. The pulmonary trunk is normal in caliber. No pulmonary embolism is seen. Mediastinum/Nodes: No mediastinal lymphadenopathy is seen. There is a nonspecific prominent lymph node at the right hilum measuring 1.1 cm. No axillary lymphadenopathy. The thyroid gland, trachea, and esophagus are within normal limits. There is a small hiatal hernia. Lungs/Pleura: Emphysematous changes are present in the lungs. Atelectasis is present bilaterally. No effusion or pneumothorax is seen. There is a 4 mm subpleural nodule in the left upper lobe, axial  image 52. Upper Abdomen: There is a left adrenal nodule measuring 2.7 cm with attenuation and 7 Hounsfield units, compatible with adrenal adenoma. No acute abnormality. Musculoskeletal: Degenerative changes are present in the thoracic spine. No acute osseous abnormality. Review of the MIP images confirms the above findings. IMPRESSION: 1. No evidence of pulmonary embolism or other acute process. 2. Emphysema. 3. 4 mm left upper lobe pulmonary nodule. No follow-up needed if patient is low-risk.This recommendation follows the consensus statement: Guidelines for Management of Incidental Pulmonary Nodules Detected on CT Images: From the Fleischner Society 2017; Radiology 2017; 284:228-243. 4. Left adrenal adenoma. 5. Coronary artery calcifications and aortic atherosclerosis. Electronically Signed   By: Thornell Sartorius M.D.   On: 04/01/2023 23:00   DG Chest 2 View Result Date: 04/01/2023 CLINICAL DATA:  Chest pain, short of breath, hypoxia EXAM: CHEST - 2 VIEW COMPARISON:  11/20/2014 FINDINGS: Frontal and lateral views of the chest demonstrate an unremarkable cardiac silhouette. Lungs are hyperinflated, lungs are hyperinflated with background parenchymal scarring consistent with emphysema. No acute airspace disease, effusion, or pneumothorax. No acute bony abnormalities. IMPRESSION: 1. Emphysema.  No acute airspace disease. Electronically Signed   By: Sharlet Salina M.D.   On: 04/01/2023 20:17     Labs:   Basic Metabolic Panel: Recent Labs  Lab 04/01/23 0834 04/01/23 1845 04/02/23 0414  NA 140 138 138  K 4.3 3.2* 3.3*  CL 110 108 106  CO2 21* 23 23  GLUCOSE 107* 120* 193*  BUN 5* 7* 7*  CREATININE 0.69 0.53 0.50  CALCIUM 10.9* 10.7* 10.8*   GFR Estimated Creatinine Clearance: 74.1 mL/min (by C-G formula based on SCr of 0.5 mg/dL). Liver Function Tests: Recent Labs  Lab 04/02/23 0414  AST 23  ALT 25  ALKPHOS 38  BILITOT 0.6  PROT 7.2  ALBUMIN 3.7   No results for input(s): "LIPASE",  "AMYLASE" in the last 168 hours. No results for input(s): "AMMONIA" in the last 168 hours. Coagulation profile No results for input(s): "INR", "PROTIME" in the last 168 hours.  CBC: Recent Labs  Lab 04/01/23 1845 04/02/23 0414  WBC 5.5 5.1  HGB 14.3 14.9  HCT 43.6 46.6*  MCV 92.0 93.0  PLT 211 215   Cardiac Enzymes: No results for input(s): "CKTOTAL", "CKMB", "CKMBINDEX", "TROPONINI" in the last 168 hours. BNP: Invalid input(s): "POCBNP" CBG: Recent Labs  Lab 04/02/23 0752 04/02/23 1059 04/02/23 1700 04/02/23 2047 04/03/23 0816  GLUCAP 151* 184* 139* 199* 129*   D-Dimer No results for input(s): "DDIMER" in the last 72 hours. Hgb A1c No results for input(s): "HGBA1C" in the last 72 hours. Lipid Profile No results for input(s): "CHOL", "HDL", "LDLCALC", "TRIG", "CHOLHDL", "LDLDIRECT" in the last 72 hours. Thyroid function studies No results for input(s): "TSH", "T4TOTAL", "T3FREE", "THYROIDAB" in the last 72 hours.  Invalid input(s): "FREET3" Anemia work up No results for input(s): "VITAMINB12", "FOLATE", "FERRITIN", "TIBC", "IRON", "RETICCTPCT" in the last 72 hours. Microbiology Recent Results (from the past 240 hours)  Resp panel by RT-PCR (RSV, Flu A&B, Covid) Anterior Nasal Swab     Status: None   Collection Time: 04/02/23  2:44 AM   Specimen: Anterior Nasal Swab  Result Value Ref Range Status   SARS Coronavirus 2 by RT PCR NEGATIVE NEGATIVE Final    Comment: (NOTE) SARS-CoV-2 target nucleic acids are NOT DETECTED.  The SARS-CoV-2 RNA is generally detectable in upper respiratory specimens during the acute phase of infection. The lowest concentration of SARS-CoV-2 viral copies this assay can detect is 138 copies/mL. A negative result does not preclude SARS-Cov-2 infection and should not be used as the sole basis for treatment or other patient management decisions. A negative result may occur with  improper specimen collection/handling, submission of specimen  other than nasopharyngeal swab, presence of viral mutation(s) within the areas targeted by this assay, and inadequate number of viral copies(<138 copies/mL). A negative result must be combined with clinical observations, patient history, and epidemiological information. The expected result is Negative.  Fact Sheet for Patients:  BloggerCourse.com  Fact Sheet for Healthcare Providers:  SeriousBroker.it  This test is no t yet approved or cleared by the [place] FDA and  has been authorized for detection and/or diagnosis of SARS-CoV-2 by FDA under an Emergency Use Authorization (EUA). This EUA will remain  in effect (meaning this test can be used) for the duration of the COVID-19 declaration under Section 564(b)(1) of the Act, 21 U.S.C.section 360bbb-3(b)(1), unless the authorization is terminated  or revoked sooner.       Influenza A by PCR NEGATIVE NEGATIVE Final   Influenza B by PCR NEGATIVE NEGATIVE Final    Comment: (NOTE) The Xpert Xpress SARS-CoV-2/FLU/RSV plus assay is intended as an aid in the diagnosis of influenza from Nasopharyngeal swab specimens and should not be used as a sole basis for treatment. Nasal washings and aspirates are unacceptable for Xpert  Xpress SARS-CoV-2/FLU/RSV testing.  Fact Sheet for Patients: BloggerCourse.com  Fact Sheet for Healthcare Providers: SeriousBroker.it  This test is not yet approved or cleared by the [place] FDA and has been authorized for detection and/or diagnosis of SARS-CoV-2 by FDA under an Emergency Use Authorization (EUA). This EUA will remain in effect (meaning this test can be used) for the duration of the COVID-19 declaration under Section 564(b)(1) of the Act, 21 U.S.C. section 360bbb-3(b)(1), unless the authorization is terminated or revoked.     Resp Syncytial Virus by PCR NEGATIVE NEGATIVE Final     Comment: (NOTE) Fact Sheet for Patients: BloggerCourse.com  Fact Sheet for Healthcare Providers: SeriousBroker.it  This test is not yet approved or cleared by the [place] FDA and has been authorized for detection and/or diagnosis of SARS-CoV-2 by FDA under an Emergency Use Authorization (EUA). This EUA will remain in effect (meaning this test can be used) for the duration of the COVID-19 declaration under Section 564(b)(1) of the Act, 21 U.S.C. section 360bbb-3(b)(1), unless the authorization is terminated or revoked.  Performed at [facility], [address] [place], [place] [address]      Discharge Instructions:   Discharge Instructions     Diet - low sodium heart healthy   Complete by: As directed    Diet Carb Modified   Complete by: As directed    Increase activity slowly   Complete by: As directed       Allergies as of 04/03/2023       Reactions   Flonase [fluticasone Propionate] Other (See Comments)   Nose bleeds        Medication List     PAUSE taking these medications    Semaglutide (1 MG/DOSE) 4 MG/3ML Sopn Wait to take this until your doctor or other care provider tells you to start again. Inject 1 mg as directed once a week.       TAKE these medications    albuterol 108 (90 Base) MCG/ACT inhaler Commonly known as: ProAir HFA Inhale 1-2 puffs into the lungs every 6 (six) hours as needed for wheezing or shortness of breath.   amLODipine 10 MG tablet Commonly known as: NORVASC Take 1 tablet (10 mg total) by mouth daily.   aspirin 81 MG tablet Take 81 mg by mouth daily.   atorvastatin 80 MG tablet Commonly known as: LIPITOR Take 1 tablet (80 mg total) by mouth daily.   azithromycin 250 MG tablet Commonly known as: ZITHROMAX 1 po daily x 4 days   carvedilol 12.5 MG tablet Commonly known as: COREG Take 1 tablet (12.5 mg total) by mouth 2 (two) times daily with  a meal.   esomeprazole 40 MG capsule Commonly known as: NEXIUM Take 1 capsule (40 mg total) by mouth daily.   FLUoxetine 40 MG capsule Commonly known as: PROZAC Take 1 capsule (40 mg total) by mouth daily.   FreeStyle Libre 2 Reader [place] Use to check glucose continuously throughout the day. E11.69   FreeStyle Libre 2 Sensor Misc Place 1 sensor on the skin every 14 days. Use to check glucose continuously. E11.69   Linzess 145 MCG Caps capsule Generic drug: linaclotide Take 1 capsule (145 mcg total) by mouth daily before breakfast.   lisinopril-hydrochlorothiazide 20-12.5 MG tablet Commonly known as: ZESTORETIC Take 1 tablet by mouth daily.   metFORMIN 500 MG 24 hr tablet Commonly known as: GLUCOPHAGE-XR TAKE 1 TABLET BY MOUTH TWICE  DAILY WITH MEALS   montelukast 10 MG tablet  Commonly known as: SINGULAIR Take 1 tablet (10 mg total) by mouth at bedtime.   nitroGLYCERIN 0.3 MG SL tablet Commonly known as: NITROSTAT DISSOLVE 1 TABLET UNDER THE  TONGUE EVERY 5 MINUTES AS NEEDED FOR CHEST PAIN. MAX OF 3 TABLETS IN 15 MINUTES. CALL 911 IF PAIN  PERSISTS.   predniSONE 50 MG tablet Commonly known as: DELTASONE Take 1 tablet (50 mg total) by mouth daily with breakfast.   tiZANidine 2 MG tablet Commonly known as: ZANAFLEX Take 2 mg by mouth every 6 (six) hours as needed for muscle spasms.   traZODone 50 MG tablet Commonly known as: DESYREL Take 1 tablet (50 mg total) by mouth at bedtime as needed. What changed: reasons to take this   Trelegy Ellipta 100-62.5-25 MCG/ACT Aepb Generic drug: Fluticasone-Umeclidin-Vilant Take 1 puff by mouth daily.   VICKS VAPORUB EX Apply 1 application topically 2 (two) times daily as needed (breathe and soreness).        Follow-up Information     Marcine Matar, MD Follow up in 1 week(s).   Specialty: Internal Medicine Contact information: [address] [place] [address] [place] [address] [phone]                   Time coordinating discharge: 45 min  Signed:  Joseph Art DO  [facility] 04/03/2023, 10:44 AM

## 2023-04-03 NOTE — Care Management Obs Status (Signed)
 MEDICARE OBSERVATION STATUS NOTIFICATION   Patient Details  Name: [name] MRN: [id number] Date of Birth: [birth year]   Medicare Observation Status Notification Given:  Yes    Beckie Busing, RN 04/03/2023, 10:09 AM

## 2023-04-03 NOTE — Telephone Encounter (Signed)
 Spoke with patient for Clarification of request ed needs. Patient voiced that she needs help with ADL's . Voiced that she gets SOB and takes a lot of her energy when she is try to do anything. Patient is scheduled for Breast surgery due to recent dx of ductal carcinoma of the left breast and she is concern that she will need additional help. PCS services offered patient is agreeable.

## 2023-04-03 NOTE — Progress Notes (Signed)
   04/03/23 1100  TOC Brief Assessment  Insurance and Status Reviewed  Patient has primary care physician Yes Laural Benes, Binnie Rail, MD)  Home environment has been reviewed Yes home alone  Prior level of function: Independent  Prior/Current Home Services No current home services  Social Drivers of Health Review SDOH reviewed no interventions necessary  Readmission risk has been reviewed Yes  Transition of care needs no transition of care needs at this time

## 2023-04-04 ENCOUNTER — Other Ambulatory Visit (HOSPITAL_COMMUNITY): Payer: Self-pay

## 2023-04-04 ENCOUNTER — Telehealth: Payer: Self-pay

## 2023-04-04 MED ORDER — ACCU-CHEK SOFTCLIX LANCETS MISC
12 refills | Status: AC
  Filled 2023-04-04: qty 100, 50d supply, fill #0

## 2023-04-04 MED ORDER — ACCU-CHEK GUIDE TEST VI STRP
ORAL_STRIP | 12 refills | Status: AC
  Filled 2023-04-04: qty 100, 50d supply, fill #0

## 2023-04-04 MED ORDER — ACCU-CHEK GUIDE W/DEVICE KIT
PACK | 0 refills | Status: AC
Start: 2023-04-04 — End: ?
  Filled 2023-04-04: qty 1, 1d supply, fill #0

## 2023-04-04 NOTE — Telephone Encounter (Signed)
 I called the patient and informed her that orders for the diabetic testing supplies have been sent to [facility]. She asked if they can be delivered and I told her that she will need to call them and I provided her with the pharmacy phone number

## 2023-04-04 NOTE — Telephone Encounter (Signed)
 From [facility] call:  She stated she had a Freestyle CGM but she "cancelled it" because it got too expensive and her insurance would not cover the sensors. She doesn't have a standard glucometer to use in lieu of the CGM.

## 2023-04-04 NOTE — Telephone Encounter (Signed)
 Diabetic testing supplies sent to [facility] pharmacy.

## 2023-04-04 NOTE — Transitions of Care (Post Inpatient/ED Visit) (Signed)
   04/04/2023  Name: [name] MRN: [id number] DOB: [birth year]  Today's TOC FU Call Status: Today's TOC FU Call Status:: Successful TOC FU Call Completed TOC FU Call Complete Date: 04/04/23 Patient's Name and Date of Birth confirmed.  Transition Care Management Follow-up Telephone Call Date of Discharge: 04/03/23 Discharge Facility: [facility] Type of Discharge: Inpatient Admission Primary Inpatient Discharge Diagnosis:: COPD exacerbation How have you been since you were released from the hospital?: Better (She stated she is doing okay) Any questions or concerns?: No  Items Reviewed: Did you receive and understand the discharge instructions provided?: Yes Medications obtained,verified, and reconciled?: Partial Review Completed Reason for Partial Mediation Review: She said she has all of her medications and did not have any questions about the med regime or need to review the med list. She stated she had a Freestyle meter but she "cancelled it" because it got too expensive and her insurance would not cover the sensors. She doesnt have a standard glucometer either Any new allergies since your discharge?: No Dietary orders reviewed?: Yes Type of Diet Ordered:: heart healthy, low sodium. carb modified Do you have support at home?: No (She said she lives alone. Her daughter left a day ago and she is not sure when she will be back.)  Medications Reviewed Today: Medications Reviewed Today   Medications were not reviewed in this encounter     Home Care and Equipment/Supplies: Were Home Health Services Ordered?: No Any new equipment or medical supplies ordered?: No  Functional Questionnaire: Do you need assistance with bathing/showering or dressing?: Yes (She said she is getting by but could use some assistance. I explained to her that Elsie Lincoln, RN is completing a PCS request for her.) Do you need assistance with meal preparation?: No (She said she is making due.) Do you  need assistance with eating?: No Do you have difficulty maintaining continence: No Do you need assistance with getting out of bed/getting out of a chair/moving?: Yes (ambulates with a cane. She left her walker in [place] and could use a new one. I told her that the provider will address that with her at her appointment at [facility] next week. the insurance company will require documentation supporting the need for the walker.) Do you have difficulty managing or taking your medications?: Yes  Follow up appointments reviewed: PCP Follow-up appointment confirmed?: Yes Date of PCP follow-up appointment?: 04/11/23 Follow-up Provider: Georgian Co, PA Specialist Hospital Follow-up appointment confirmed?: No Reason Specialist Follow-Up Not Confirmed:  (She said the surgeon told her they would call her to re-schedule the lumpectomy as it had to be cancelled this week due to her COPD exacerbation.) Do you need transportation to your follow-up appointment?: No (She said she usually calls her insurance for a ride. I told her that if she is not able to schedule the ride with the insurance company, she should call this clinic and we can arrange a cab for her to the appt next week at [facility]) Do you understand care options if your condition(s) worsen?: Yes-patient verbalized understanding    SIGNATURE Robyne Peers, RN

## 2023-04-05 ENCOUNTER — Other Ambulatory Visit (HOSPITAL_COMMUNITY): Payer: Self-pay

## 2023-04-08 ENCOUNTER — Encounter: Payer: Self-pay | Admitting: Licensed Clinical Social Worker

## 2023-04-08 NOTE — Progress Notes (Signed)
 [facility] CSW Progress Note  Clinical Child psychotherapist received notification that pt was approved for grant assistance through [employer]    Oanh Devivo E Persis Graffius, LCSW Clinical Social Worker [employer]

## 2023-04-09 ENCOUNTER — Encounter: Payer: Self-pay | Admitting: *Deleted

## 2023-04-10 NOTE — Progress Notes (Unsigned)
 [name], female   DOB: [birth year], 68 y.o.   MRN: [id number] Principal Problem:   COPD with acute exacerbation (HCC) Active Problems:   Elevated troponin   Acute respiratory failure with hypoxia (HCC)   Ductal carcinoma in situ (DCIS) of left breast   Hypokalemia   Benign essential HTN   Hyperlipidemia   Non-insulin dependent type 2 diabetes mellitus (HCC)   History of COPD   Irritable bowel syndrome  COPD with acute exacerbation Acute hypoxic respiratory failure secondary to COPD exacerbation - Patient had been scheduled for lumpectomy with radioactive seed localization today however during the preop evaluation patient found to have acute hypoxic respiratory failure.  O2 sat dropped to low 80s in room air while patient was waiting in the lobby. -Patient is complaining of nonproductive cough and shortness of breath.  Denies any lower extremity swelling, fever, chill and recent known exposure to COVID. -In the ED patient has been placed on 2 L nasal cannula oxygen currently O2 sat in between 91 to 95%.  Otherwise hemodynamically stable. -Chest x-ray no active disease process except emphysema. - CTA chest no evidence of PE.  Emphysema.  4 mm left upper lobe lung nodule. - In the ED patient has been treated with Decadron and continuous albuterol nebulizer and DuoNeb nebulizer. 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  Patient supposed to be having lumpectomy with radiation seed implantation surgery  3/19 which has been canceled due to acute hypoxic respiratory failure and COPD exacerbation.     Elevated troponin-secondary demand ischemia Noncardiac related chest pain -resolved, troponin negative     Hypokalemia -replete   Benign essential  hypertension -resume home meds     Non-insulin-dependent DM type II -resume home meds   Irritable bowel syndrome -Continue Linzess.   Generalized anxiety disorder -Continue Prozac.   Chronic muscle spasm Continue Zanaflex

## 2023-04-11 ENCOUNTER — Encounter: Payer: Self-pay | Admitting: Physician Assistant

## 2023-04-11 ENCOUNTER — Ambulatory Visit: Payer: Self-pay | Attending: Physician Assistant | Admitting: Physician Assistant

## 2023-04-11 VITALS — BP 100/64 | HR 77 | Resp 18 | Ht 67.01 in | Wt 178.0 lb

## 2023-04-11 DIAGNOSIS — Z09 Encounter for follow-up examination after completed treatment for conditions other than malignant neoplasm: Secondary | ICD-10-CM | POA: Diagnosis not present

## 2023-04-11 DIAGNOSIS — C50912 Malignant neoplasm of unspecified site of left female breast: Secondary | ICD-10-CM

## 2023-04-11 DIAGNOSIS — E876 Hypokalemia: Secondary | ICD-10-CM

## 2023-04-11 DIAGNOSIS — J9601 Acute respiratory failure with hypoxia: Secondary | ICD-10-CM

## 2023-04-11 DIAGNOSIS — I10 Essential (primary) hypertension: Secondary | ICD-10-CM | POA: Diagnosis not present

## 2023-04-11 MED ORDER — LISINOPRIL-HYDROCHLOROTHIAZIDE 20-12.5 MG PO TABS: 0.5000 | ORAL_TABLET | Freq: Every day | ORAL | 1 refills | Status: DC

## 2023-04-11 NOTE — Progress Notes (Signed)
 What stage of cancer, when is procedure ?

## 2023-04-12 ENCOUNTER — Encounter: Payer: Self-pay | Admitting: Physician Assistant

## 2023-04-12 LAB — BASIC METABOLIC PANEL WITH GFR
BUN/Creatinine Ratio: 18 (ref 12–28)
BUN: 13 mg/dL (ref 8–27)
CO2: 23 mmol/L (ref 20–29)
Calcium: 11.5 mg/dL — ABNORMAL HIGH (ref 8.7–10.3)
Chloride: 103 mmol/L (ref 96–106)
Creatinine, Ser: 0.74 mg/dL (ref 0.57–1.00)
Glucose: 105 mg/dL — ABNORMAL HIGH (ref 70–99)
Potassium: 3.8 mmol/L (ref 3.5–5.2)
Sodium: 143 mmol/L (ref 134–144)
eGFR: 89 mL/min/{1.73_m2} (ref >59)

## 2023-04-12 NOTE — Telephone Encounter (Addendum)
 Patient returning call regarding their lab results. Please advise.

## 2023-04-16 ENCOUNTER — Telehealth: Payer: Self-pay | Admitting: Internal Medicine

## 2023-04-16 ENCOUNTER — Encounter: Payer: Self-pay | Admitting: *Deleted

## 2023-04-16 ENCOUNTER — Other Ambulatory Visit: Payer: Self-pay | Admitting: Pharmacist

## 2023-04-16 DIAGNOSIS — I10 Essential (primary) hypertension: Secondary | ICD-10-CM

## 2023-04-16 MED ORDER — LISINOPRIL-HYDROCHLOROTHIAZIDE 20-12.5 MG PO TABS: 0.5000 | ORAL_TABLET | Freq: Every day | ORAL | 2 refills | Status: DC

## 2023-04-16 NOTE — Telephone Encounter (Addendum)
 Patient's son [name] called again for his mother, stating that no one had reached out. I informed the patient's son that the CMA called the patient and stated   "Patient identified by name and date of birth.  Patient is aware that someone from the surgery center will be reaching out to re-schedule her previous surgery."  [name] understood and had no questions at the moment.

## 2023-04-16 NOTE — Telephone Encounter (Signed)
  Please Call, and advise patient.  Copied from CRM [id number]. Topic: Clinical - Medication Question >> Apr 16, 2023 10:40 AM Priscille Loveless wrote: Reason for CRM: Pts son called to day and stated that he called twice last week and stated that no one called him or his mother back. He is highly upset. He called today and was asking to speak to [place] and I called over to the office and was told that he could not speak with anyone that they was in the room with patients and to do a CRM.  Pt is highly upset and stating that there is a lack of concern for his mother. It is not acceptable. His mother has a life threatening diagnosis and his support from the drs and nurses and call backs has fallen short of his expections.  Please give them a call back.

## 2023-04-16 NOTE — Telephone Encounter (Signed)
 Copied from CRM [id number]. Topic: Referral - Status >> Apr 15, 2023  1:11 PM Shelah Lewandowsky wrote: Reason for CRM: checking status of referral cardiologist - please call son [phone]

## 2023-04-16 NOTE — Telephone Encounter (Signed)
 Patient identified by name and date of birth.  Patient is aware that someone from the surgery center will be reaching out to re-schedule her previous surgery.

## 2023-04-17 ENCOUNTER — Ambulatory Visit: Admitting: Physician Assistant

## 2023-04-19 ENCOUNTER — Telehealth: Payer: Self-pay | Admitting: Internal Medicine

## 2023-04-19 ENCOUNTER — Telehealth: Payer: Self-pay | Admitting: *Deleted

## 2023-04-19 NOTE — Telephone Encounter (Signed)
error 

## 2023-04-19 NOTE — Telephone Encounter (Signed)
 Called and spoke to the patient. Verified name & DOB. Informed to reach out to Dr.Iruku, her oncologist, and team for further clarification. Gave patient office number. Patient expressed verbal understanding.

## 2023-04-19 NOTE — Telephone Encounter (Signed)
 This RN received VM from pt stating she is need to find out when her surgery is scheduled.  She did not leave her dob and she was trying to verbalize her return call number but not able to remember all the numbers.  This RN then received VM from her son- [name]- requesting a return call to [phone].  This RN spoke with [name] who was wanting to know when her appts are for surgery and what the plan is.  This RN informed need for above to be discussed with the navigators and this RN will send communication to them and request to call him due to miss calls with his mother.  Agreement to plan verbalized by [name].

## 2023-04-19 NOTE — Telephone Encounter (Deleted)
 Error see notes from 04/16/2023

## 2023-04-19 NOTE — Telephone Encounter (Signed)
 Yes please, she needs to reach out to her oncologist regarding this.

## 2023-04-19 NOTE — Telephone Encounter (Signed)
 Pt son called again wanted to speak to pcp or the CMA regarding his mom conditions. I told [id number] to send crm due to pcp( not in office )and cma are in clinical at the moment.

## 2023-04-19 NOTE — Telephone Encounter (Signed)
 Copied from CRM [id number]. Topic: General - Call Back - No Documentation >> Apr 19, 2023 10:59 AM Higinio Roger wrote: Reason for CRM: Patient would like a callback to discuss her stage of cancer. She stated she has not received any further guidance from her Dr.  Juliann Pulse #: [phone]

## 2023-04-22 ENCOUNTER — Telehealth: Payer: Self-pay | Admitting: *Deleted

## 2023-04-22 NOTE — Telephone Encounter (Signed)
 Left VM on Casimiro Needle (son) regarding sx appt stating I would reach out to Dr. Fatima Sanger office for an update on a sx date.  Left phone number for Dr. Fatima Sanger office as well on VM and instructed him to call their office as well and that their office will be scheduling the sx.

## 2023-04-22 NOTE — Telephone Encounter (Signed)
 Beth calling in w/ patient on line to get update on surgical clearance. Holding off on scheduling surgery until clearance is available.   Spoke to CAL - appointment needed w/ PCP for surgical clearance.   Scheduled pt to see Dr. Laury Deep 04/25/2023

## 2023-04-22 NOTE — Telephone Encounter (Signed)
 Patient's son, Casimiro Needle called back and explained that the patient has an appt with oncology but they are needing a medical clearance. Please call and advise.

## 2023-04-24 ENCOUNTER — Other Ambulatory Visit: Payer: Self-pay | Admitting: Physician Assistant

## 2023-04-24 DIAGNOSIS — F32A Depression, unspecified: Secondary | ICD-10-CM

## 2023-04-25 ENCOUNTER — Ambulatory Visit: Attending: Internal Medicine | Admitting: Internal Medicine

## 2023-04-25 ENCOUNTER — Encounter: Payer: Self-pay | Admitting: Internal Medicine

## 2023-04-25 ENCOUNTER — Telehealth: Payer: Self-pay

## 2023-04-25 VITALS — BP 109/70 | HR 69 | Temp 98.2°F | Ht 67.0 in | Wt 184.0 lb

## 2023-04-25 DIAGNOSIS — Z7985 Long-term (current) use of injectable non-insulin antidiabetic drugs: Secondary | ICD-10-CM

## 2023-04-25 DIAGNOSIS — Z01818 Encounter for other preprocedural examination: Secondary | ICD-10-CM | POA: Diagnosis not present

## 2023-04-25 DIAGNOSIS — Z7984 Long term (current) use of oral hypoglycemic drugs: Secondary | ICD-10-CM | POA: Diagnosis not present

## 2023-04-25 DIAGNOSIS — C50912 Malignant neoplasm of unspecified site of left female breast: Secondary | ICD-10-CM

## 2023-04-25 DIAGNOSIS — I10 Essential (primary) hypertension: Secondary | ICD-10-CM | POA: Diagnosis not present

## 2023-04-25 DIAGNOSIS — E1159 Type 2 diabetes mellitus with other circulatory complications: Secondary | ICD-10-CM

## 2023-04-25 DIAGNOSIS — E119 Type 2 diabetes mellitus without complications: Secondary | ICD-10-CM | POA: Diagnosis not present

## 2023-04-25 DIAGNOSIS — J449 Chronic obstructive pulmonary disease, unspecified: Secondary | ICD-10-CM

## 2023-04-25 DIAGNOSIS — Z6828 Body mass index (BMI) 28.0-28.9, adult: Secondary | ICD-10-CM

## 2023-04-25 DIAGNOSIS — E669 Obesity, unspecified: Secondary | ICD-10-CM

## 2023-04-25 LAB — GLUCOSE, POCT (MANUAL RESULT ENTRY): POC Glucose: 103 mg/dL — AB (ref 70–99)

## 2023-04-25 MED ORDER — ALBUTEROL SULFATE (2.5 MG/3ML) 0.083% IN NEBU
2.5000 mg | INHALATION_SOLUTION | Freq: Four times a day (QID) | RESPIRATORY_TRACT | 1 refills | Status: DC | PRN
  Filled 2023-04-25 – 2023-05-02 (×3): qty 150, 13d supply, fill #0

## 2023-04-25 NOTE — Patient Instructions (Addendum)
 We will send a prescription to a medical supply store for you to get a nebulizer machine and the solution to use with it.  Use your albuterol/ProAir inhaler before any activity by going up or down stairs or walking out to the parking lot.  Always keep the ProAir inhaler with you in your purse.  You will need to be off Ozempic for 1 week prior to surgery.  I will try to get you in with your pulmonologist Dr. Vassie Loll.

## 2023-04-25 NOTE — Telephone Encounter (Signed)
 Signed PCS request efaxed to [place] [employer]

## 2023-04-25 NOTE — Progress Notes (Unsigned)
 [name], female    DOB: 1955/01/19  MRN: [id number]  CC: Pre-op Exam (Pre-op exam - breast surgery /)   Subjective: [name] is a 68 y.o. female who presents for preoperative evaluation Her concerns today include:  Patient with history of HTN, HL, COPD, DM type II, anxiety/depression, LT breast CA, mild OSA  Patient presents today for preoperative evaluation.  She was scheduled for radioactive seed localized lumpectomy to the left breast for invasive ductal CA/ductal carcinoma in situ by Dr. Corliss Skains on 04/03/2023 but was found to have low oxygen level when she was evaluated in his office.  She was subsequently hospitalized with COPD exacerbation with acute respiratory failure with hypoxia.  CTA of the chest revealed no PE but did show emphysematous changes and a 4 mm left upper lobe nodule.  She was treated with steroids and DuoNebs.  By the time of discharge she was not requiring oxygen. She has completed oral abx and Prednisone Posthospitalization, she saw our physician assistant.  She is wanting to move forward with having her surgery done.  She has been using her Trelegy inhaler daily and ProAir a few times a day.  She has a flight of stairs in her house.  She states she goes up and down the stairs but slowly.  She denies any increased or worsening shortness of breath since hospital discharge.  No increased cough.  No fever.  No chest pains.  Mobility is limited due to her COPD.  She ambulates with a cane.  She is not able to ambulate a full block without stopping.  She has requested PCS services to assist with ADLs including meal preps, helping with bath and some help with putting on close.  In regards to her diabetes, she was on Ozempic but this was held since hospital discharge in preparation for surgery.  She has been taking the metformin 500 mg twice a day.  She checks blood sugars twice a day.  Reports her blood sugars have been good with most recent reading being about  90.  In regards to her blood pressure, she reports compliance with amlodipine 10 mg daily, carvedilol 12.5 mg twice a day and lisinopril/HCTZ 20/12 point 5/2 tablet daily.   Patient Active Problem List   Diagnosis Date Noted   Elevated troponin 04/02/2023   Acute respiratory failure with hypoxia (HCC) 04/02/2023   Ductal carcinoma in situ (DCIS) of left breast 04/02/2023   Hypokalemia 04/02/2023   History of COPD 04/02/2023   Irritable bowel syndrome 04/02/2023   Malignant neoplasm of lower-outer quadrant of left breast of female, estrogen receptor positive (HCC) 03/15/2023   Invasive ductal carcinoma of breast, left (HCC) 03/13/2023   Osteopenia after menopause 02/27/2023   Nondisplaced fracture of proximal phalanx of left ring finger 07/25/2021   Displaced fracture of proximal phalanx of left little finger, initial encounter for closed fracture 07/07/2021   Onychomycosis of toenail 03/22/2021   Dry skin dermatitis 03/22/2021   Non-insulin dependent type 2 diabetes mellitus (HCC) 02/18/2021   COPD with acute exacerbation (HCC) 11/08/2020   Depression 11/08/2020   Hyperlipidemia 11/08/2020   Mild sleep apnea 10/17/2020   Centrilobular emphysema (HCC) 06/26/2014   Benign essential HTN 06/26/2014   Dyspnea 06/25/2014     Current Outpatient Medications on File Prior to Visit  Medication Sig Dispense Refill   Accu-Chek Softclix Lancets lancets Use as instructed to check blood sugars.  E11.69 100 each 12   albuterol (PROAIR HFA) 108 (90  Base) MCG/ACT inhaler Inhale 1-2 puffs into the lungs every 6 (six) hours as needed for wheezing or shortness of breath. 18 g 2   amLODipine (NORVASC) 10 MG tablet Take 1 tablet (10 mg total) by mouth daily. 100 tablet 1   aspirin 81 MG tablet Take 81 mg by mouth daily.     atorvastatin (LIPITOR) 80 MG tablet Take 1 tablet (80 mg total) by mouth daily. 90 tablet 1   azithromycin (ZITHROMAX) 250 MG tablet Take 1 tablet by mouth daily 4 each 0   Blood  Glucose Monitoring Suppl (ACCU-CHEK GUIDE) w/Device KIT UAD to check blood sugars daily. E11.69 1 kit 0   Camphor-Eucalyptus-Menthol (VICKS VAPORUB EX) Apply 1 application topically 2 (two) times daily as needed (breathe and soreness).     carvedilol (COREG) 12.5 MG tablet Take 1 tablet (12.5 mg total) by mouth 2 (two) times daily with a meal. 180 tablet 3   esomeprazole (NEXIUM) 40 MG capsule Take 1 capsule (40 mg total) by mouth daily. 90 capsule 3   FLUoxetine (PROZAC) 40 MG capsule TAKE 1 CAPSULE BY MOUTH DAILY 100 capsule 1   Fluticasone-Umeclidin-Vilant (TRELEGY ELLIPTA) 100-62.5-25 MCG/ACT AEPB Take 1 puff by mouth daily. 180 each 1   glucose blood (ACCU-CHEK GUIDE TEST) test strip Use as instructed to check blood sugars twice a day E11.69 100 each 12   linaclotide (LINZESS) 145 MCG CAPS capsule Take 1 capsule (145 mcg total) by mouth daily before breakfast. 100 capsule 1   lisinopril-hydrochlorothiazide (ZESTORETIC) 20-12.5 MG tablet Take 0.5 tablets by mouth daily. 50 tablet 2   metFORMIN (GLUCOPHAGE-XR) 500 MG 24 hr tablet TAKE 1 TABLET BY MOUTH TWICE  DAILY WITH MEALS 180 tablet 1   montelukast (SINGULAIR) 10 MG tablet Take 1 tablet (10 mg total) by mouth at bedtime. 90 tablet 3   nitroGLYCERIN (NITROSTAT) 0.3 MG SL tablet DISSOLVE 1 TABLET UNDER THE  TONGUE EVERY 5 MINUTES AS NEEDED FOR CHEST PAIN. MAX OF 3 TABLETS IN 15 MINUTES. CALL 911 IF PAIN  PERSISTS. 100 tablet 3   predniSONE (DELTASONE) 50 MG tablet Take 1 tablet (50 mg total) by mouth daily with breakfast. 4 tablet 0   [Paused] Semaglutide, 1 MG/DOSE, 4 MG/3ML SOPN Inject 1 mg as directed once a week. 9 mL 1   tiZANidine (ZANAFLEX) 2 MG tablet Take 2 mg by mouth every 6 (six) hours as needed for muscle spasms.     traZODone (DESYREL) 50 MG tablet Take 1 tablet (50 mg total) by mouth at bedtime as needed. (Patient taking differently: Take 50 mg by mouth at bedtime as needed for sleep.) 90 tablet 3   Continuous Glucose Receiver  (FREESTYLE LIBRE 2 READER) DEVI Use to check glucose continuously throughout the day. E11.69 (Patient not taking: Reported on 04/11/2023) 1 each 0   Continuous Glucose Sensor (FREESTYLE LIBRE 2 SENSOR) MISC Place 1 sensor on the skin every 14 days. Use to check glucose continuously. E11.69 (Patient not taking: Reported on 04/11/2023) 2 each 6   Current Facility-Administered Medications on File Prior to Visit  Medication Dose Route Frequency Provider Last Rate Last Admin   Chlorhexidine Gluconate Cloth 2 % PADS 6 each  6 each Topical Once Manus Rudd, MD       And   Chlorhexidine Gluconate Cloth 2 % PADS 6 each  6 each Topical Once Manus Rudd, MD        Allergies  Allergen Reactions   Flonase [Fluticasone Propionate] Other (See Comments)  Nose bleeds    Social History   Socioeconomic History   Marital status: Single    Spouse name: Not on file   Number of children: Not on file   Years of education: Not on file   Highest education level: Not on file  Occupational History   Not on file  Tobacco Use   Smoking status: Former    Current packs/day: 0.00    Average packs/day: 1 pack/day for 38.0 years (38.0 ttl pk-yrs)    Types: Cigarettes    Start date: 01/15/1970    Quit date: 01/16/2008    Years since quitting: 15.2   Smokeless tobacco: Never  Vaping Use   Vaping status: Never Used  Substance and Sexual Activity   Alcohol use: No    Alcohol/week: 0.0 standard drinks of alcohol   Drug use: No   Sexual activity: Not Currently    Birth control/protection: Abstinence  Other Topics Concern   Not on file  Social History Narrative   Not on file   Social Drivers of Health   Financial Resource Strain: Low Risk  (12/31/2022)   Overall Financial Resource Strain (CARDIA)    Difficulty of Paying Living Expenses: Not very hard  Food Insecurity: No Food Insecurity (04/02/2023)   Hunger Vital Sign    Worried About Running Out of Food in the Last Year: Never true    Ran Out of  Food in the Last Year: Never true  Transportation Needs: No Transportation Needs (04/02/2023)   PRAPARE - Administrator, Civil Service (Medical): No    Lack of Transportation (Non-Medical): No  Physical Activity: Inactive (12/31/2022)   Exercise Vital Sign    Days of Exercise per Week: 0 days    Minutes of Exercise per Session: 0 min  Stress: No Stress Concern Present (12/31/2022)   [employer] of Occupational Health - Occupational Stress Questionnaire    Feeling of Stress : Not at all  Social Connections: Socially Isolated (04/02/2023)   Social Connection and Isolation Panel [NHANES]    Frequency of Communication with Friends and Family: Never    Frequency of Social Gatherings with Friends and Family: Never    Attends Religious Services: Never    Database administrator or Organizations: No    Attends Banker Meetings: Never    Marital Status: Divorced  Catering manager Violence: Not At Risk (04/02/2023)   Humiliation, Afraid, Rape, and Kick questionnaire    Fear of Current or Ex-Partner: No    Emotionally Abused: No    Physically Abused: No    Sexually Abused: No    Family History  Problem Relation Age of Onset   Allergies Mother    Allergies Daughter    Asthma Son        had as a child   BRCA 1/2 Neg Hx    Breast cancer Neg Hx     Past Surgical History:  Procedure Laterality Date   BREAST BIOPSY Left 03/11/2023   [place] LT BREAST BX W LOC DEV 1ST LESION IMG BX SPEC US GUIDE 03/11/2023 [facility] MAMMOGRAPHY   TEE WITHOUT CARDIOVERSION N/A 08/24/2014   Procedure: TRANSESOPHAGEAL ECHOCARDIOGRAM (TEE);  Surgeon: Yates Decamp, MD;  [facility] ENDOSCOPY;  Service: Cardiovascular;  Laterality: N/A;   TOTAL ABDOMINAL HYSTERECTOMY  01/15/2005    ROS: Review of Systems Negative except as stated above  PHYSICAL EXAM: BP 109/70 (BP Location: Left Arm, Patient Position: Sitting, Cuff Size: Normal)   Pulse 69  Temp 98.2 F (36.8 C) (Oral)   Ht 5\' 7"   (1.702 m)   Wt 184 lb (83.5 kg)   SpO2 92%   BMI 28.82 kg/m   Physical Exam Pulse ox at rest on room air is 96 to 97%. Pulse ox with ambulation 94 to 95%.  General appearance - alert, older African-American female well appearing, and in no distress Mental status -patient is a little forgetful but answers most questions appropriately. Eyes - pupils equal and reactive, extraocular eye movements intact Mouth - mucous membranes moist, pharynx normal without lesions Neck - supple, no significant adenopathy Chest -breath sounds decreased bilaterally.  No wheezes or crackles heard Heart - normal rate, regular rhythm, normal S1, S2, no murmurs, rubs, clicks or gallops Abdomen - soft, nontender, nondistended, no masses or organomegaly Musculoskeletal -patient ambulates with a cane. Extremities -no lower extremity edema.  Results for orders placed or performed in visit on 04/25/23  POCT glucose (manual entry)   Collection Time: 04/25/23  3:22 PM  Result Value Ref Range   POC Glucose 103 (A) 70 - 99 mg/dl   Lab Results  Component Value Date   HGBA1C 6.2 03/15/2023       Latest Ref Rng & Units 04/11/2023   12:04 PM 04/02/2023    4:14 AM 04/01/2023    6:45 PM  CMP  Glucose 70 - 99 mg/dL 161  096  045   BUN 8 - 27 mg/dL 13  7  7    Creatinine 0.57 - 1.00 mg/dL 4.09  8.11  9.14   Sodium 134 - 144 mmol/L 143  138  138   Potassium 3.5 - 5.2 mmol/L 3.8  3.3  3.2   Chloride 96 - 106 mmol/L 103  106  108   CO2 20 - 29 mmol/L 23  23  23    Calcium 8.7 - 10.3 mg/dL 78.2  95.6  21.3   Total Protein 6.5 - 8.1 g/dL  7.2    Total Bilirubin 0.0 - 1.2 mg/dL  0.6    Alkaline Phos 38 - 126 U/L  38    AST 15 - 41 U/L  23    ALT 0 - 44 U/L  25     Lipid Panel     Component Value Date/Time   CHOL 172 01/29/2022 1403   CHOL 166 03/29/2021 0000   TRIG 128 01/29/2022 1403   HDL 56 01/29/2022 1403   HDL 51 03/29/2021 0000   CHOLHDL 3.1 01/29/2022 1403   LDLCALC 94 01/29/2022 1403    CBC     Component Value Date/Time   WBC 5.1 04/02/2023 0414   RBC 5.01 04/02/2023 0414   HGB 14.9 04/02/2023 0414   HGB 13.9 11/08/2020 1509   HCT 46.6 (H) 04/02/2023 0414   HCT 41.8 11/08/2020 1509   PLT 215 04/02/2023 0414   PLT 217 11/08/2020 1509   MCV 93.0 04/02/2023 0414   MCV 90 11/08/2020 1509   MCH 29.7 04/02/2023 0414   MCHC 32.0 04/02/2023 0414   RDW 12.9 04/02/2023 0414   RDW 12.6 11/08/2020 1509   LYMPHSABS 1,636 01/29/2022 1403   LYMPHSABS 1.8 11/08/2020 1509   MONOABS 0.3 08/01/2008 1640   EOSABS 57 01/29/2022 1403   EOSABS 0.1 11/08/2020 1509   BASOSABS 29 01/29/2022 1403   BASOSABS 0.0 11/08/2020 1509    ASSESSMENT AND PLAN: 1. Preop examination (Primary) Pt with several chronic medical conditions and recent COPD exacerbation needing to have low risk surgery.  DM and  HTN are optimized. Respiratory status satisfactory. Will try to get her in with Dr. Vassie Loll if possible prior to surgery provided it is not a long wait time. Advised to continue Trelegy inhaler.  Advise to use Proair prior to any activity. -rxn sent to [facility] to get her a neb machine  - Ambulatory referral to Pulmonology  2. Invasive ductal carcinoma of breast, left (HCC)   3. Chronic obstructive pulmonary disease, unspecified COPD type (HCC) See #1 above - Ambulatory referral to Pulmonology - For home use only DME Nebulizer machine  4. Type 2 diabetes mellitus with obesity (HCC) Stable Will continue to hold Ozempic in anticipation of surgery.  Continue Metformin Advised to check BS 1-2 a day before meals with goal being 90-130 - POCT glucose (manual entry)  5. Hypertension associated with type 2 diabetes mellitus (HCC) At goal Continue Norvasc 10 mg, Coreg 12.5 mg BID and Lis/hydrochlorothiazide 20.12.5 mg daily   Patient was given the opportunity to ask questions.  Patient verbalized understanding of the plan and was able to repeat key elements of the plan.   This documentation was completed  using Paediatric nurse.  Any transcriptional errors are unintentional.  Orders Placed This Encounter  Procedures   POCT glucose (manual entry)     Requested Prescriptions    No prescriptions requested or ordered in this encounter    No follow-ups on file.  Jonah Blue, MD, FACP

## 2023-04-26 ENCOUNTER — Telehealth: Payer: Self-pay | Admitting: Pulmonary Disease

## 2023-04-26 ENCOUNTER — Other Ambulatory Visit: Payer: Self-pay

## 2023-04-26 ENCOUNTER — Telehealth: Payer: Self-pay | Admitting: Internal Medicine

## 2023-04-26 ENCOUNTER — Other Ambulatory Visit (HOSPITAL_COMMUNITY): Payer: Self-pay

## 2023-04-26 ENCOUNTER — Telehealth: Payer: Self-pay

## 2023-04-26 ENCOUNTER — Other Ambulatory Visit (HOSPITAL_BASED_OUTPATIENT_CLINIC_OR_DEPARTMENT_OTHER): Payer: Self-pay

## 2023-04-26 ENCOUNTER — Encounter: Payer: Self-pay | Admitting: Pharmacist

## 2023-04-26 NOTE — Telephone Encounter (Signed)
 FYI: Alva Garnet, patient's son,  is authorized to receive information per DPR on file.

## 2023-04-26 NOTE — Telephone Encounter (Signed)
 All requested information has been successfully faxed to [facility]. Fax #: [phone].

## 2023-04-26 NOTE — Telephone Encounter (Signed)
 Was able to schedule w/ Katie next Wednesday in [place]. Patient is aware of the location, but has to call family for transportation. Patient has been scheduled and will call back if that appointment does not work for her

## 2023-04-26 NOTE — Telephone Encounter (Signed)
 Copied from CRM [id number]. Topic: Medical Record Request - Other >> Apr 26, 2023 11:14 AM Shelah Lewandowsky wrote: Reason for CRM: Son Casimiro Needle has questions about patients recent visit and wants to know where patient is at in process of having surgery- [phone]

## 2023-04-26 NOTE — Telephone Encounter (Signed)
 Please send my letter and office visit from yesterday to Dr. Corliss Skains office.

## 2023-04-26 NOTE — Telephone Encounter (Signed)
 She will need pre-op eval, next available appt with APP/ any provider

## 2023-04-26 NOTE — Telephone Encounter (Signed)
-----   Message from Jonah Blue sent at 04/25/2023 10:33 PM EDT ----- Regarding: COPD/Pre-op eval I am the PCP for this pt who has COPD. You last saw her in 2023. She was on home O2 but did no longer qualified for it by walk test in 08/2022.  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  She was scheduled for 04/03/2023 but it was cancel after Pox in surgeon's office was in the 80s. Ended up hospitalized with COPD exacerbation.  Did not require home O2 on discharge.  I saw her today in f/u for pre-op eval. Currently on Trelegy and ProAir.  Ordered neb device for her today. She is very anxious about wanting to move forward with having the lumpectomy with radiation seed planting done.  Is it possible for one of you to see her ASAP prior to her surgery?

## 2023-04-28 NOTE — Telephone Encounter (Signed)
 PC was returned to pt's son Bambi Lever on 04/26/2023 around 5 p.m. He lives in [place] .  He was inquiring about where pt is in the process of getting the surgery done on her LT breast.  I informed him that pt was seen 04/25/2023 and over all stable but I wanted to get her in with pulmonary specialist if possible prior to her surgery given recent hospitalization for COPD.  I did message Dr. Villa Greaser and he has scheduled her to see one of his APP on 05/01/2023.  Just want to make sure her lung status is optimized.  Otherwise, I have sent my note and letter over to the surgeon already. He thanked me for return the call.  All questions were answered.

## 2023-04-29 ENCOUNTER — Other Ambulatory Visit: Payer: Self-pay | Admitting: Internal Medicine

## 2023-04-29 NOTE — Telephone Encounter (Unsigned)
 Copied from CRM [id number]. Topic: Clinical - Medication Refill >> Apr 29, 2023 11:59 AM Elle L wrote: Most Recent Primary Care Visit:  Provider: Concetta Dee B  Department: [facility]  Visit Type: OFFICE VISIT  Date: 04/25/2023  Medication: atorvastatin (LIPITOR) 80 MG tablet   Has the patient contacted their pharmacy? No  Is this the correct pharmacy for this prescription? Yes   This is the patient's preferred pharmacy:   [facility] [address] [place], [place] - [address] [place] [address] Phone: [phone] Fax: [phone]  Has the prescription been filled recently? No  Is the patient out of the medication? Yes  Has the patient been seen for an appointment in the last year OR does the patient have an upcoming appointment? Yes  Can we respond through MyChart? No  Agent: Please be advised that Rx refills may take up to 3 business days. We ask that you follow-up with your pharmacy.

## 2023-04-30 ENCOUNTER — Encounter: Payer: Self-pay | Admitting: *Deleted

## 2023-04-30 NOTE — Telephone Encounter (Signed)
 Requested medication (s) are due for refill today: No   Requested medication (s) are on the active medication list: Yes  Last refill:  12/17/22, #90, 1 refill   Future visit scheduled: Yes  Notes to clinic:  Pharmacy requesting 100-day supply but patient's labs outdated. Unable to refill per protocol due to failed labs, no updated results.      Requested Prescriptions  Pending Prescriptions Disp Refills   atorvastatin (LIPITOR) 80 MG tablet 90 tablet 1    Sig: Take 1 tablet (80 mg total) by mouth daily.     Cardiovascular:  Antilipid - Statins Failed - 04/30/2023  1:18 PM      Failed - Lipid Panel in normal range within the last 12 months    Cholesterol, Total  Date Value Ref Range Status  03/29/2021 166 100 - 199 mg/dL Final   Cholesterol  Date Value Ref Range Status  01/29/2022 172 <200 mg/dL Final   LDL Cholesterol (Calc)  Date Value Ref Range Status  01/29/2022 94 mg/dL (calc) Final    Comment:    Reference range: <100 . Desirable range <100 mg/dL for primary prevention;   <70 mg/dL for patients with CHD or diabetic patients  with > or = 2 CHD risk factors. Aaron Aas LDL-C is now calculated using the Martin-Hopkins  calculation, which is a validated novel method providing  better accuracy than the Friedewald equation in the  estimation of LDL-C.  Melinda Sprawls et al. Erroll Heard. 9562;130(86): 2061-2068  (http://education.[facility].com/faq/FAQ164)    HDL  Date Value Ref Range Status  01/29/2022 56 > OR = 50 mg/dL Final  57/84/6962 51 >95 mg/dL Final   Triglycerides  Date Value Ref Range Status  01/29/2022 128 <150 mg/dL Final         Passed - Patient is not pregnant      Passed - Valid encounter within last 12 months    Recent Outpatient Visits           5 days ago Preop examination   [facility] [place]. [facility] Lawrance Presume, MD   2 weeks ago Hypokalemia   [facility] [place].  [facility] [place], [place], [place]   1 month ago Type 2 diabetes mellitus with obesity [facility])   [facility] [place]. [facility] Concetta Dee B, MD   2 months ago Type 2 diabetes mellitus with obesity [facility])   [facility] [place]. [facility] Freada Jacobs, [place] L, RPH-CPP   4 months ago Type 2 diabetes mellitus with obesity [facility])    [facility] [place]. [facility] Lawrance Presume, MD

## 2023-05-01 ENCOUNTER — Other Ambulatory Visit: Payer: Self-pay

## 2023-05-01 ENCOUNTER — Ambulatory Visit (INDEPENDENT_AMBULATORY_CARE_PROVIDER_SITE_OTHER): Admitting: Nurse Practitioner

## 2023-05-01 ENCOUNTER — Encounter: Payer: Self-pay | Admitting: Nurse Practitioner

## 2023-05-01 ENCOUNTER — Other Ambulatory Visit: Payer: Self-pay | Admitting: Physician Assistant

## 2023-05-01 VITALS — BP 106/68 | HR 70 | Temp 97.8°F | Ht 67.0 in | Wt 187.4 lb

## 2023-05-01 DIAGNOSIS — C50512 Malignant neoplasm of lower-outer quadrant of left female breast: Secondary | ICD-10-CM | POA: Diagnosis not present

## 2023-05-01 DIAGNOSIS — J449 Chronic obstructive pulmonary disease, unspecified: Secondary | ICD-10-CM | POA: Diagnosis not present

## 2023-05-01 DIAGNOSIS — Z01811 Encounter for preprocedural respiratory examination: Secondary | ICD-10-CM | POA: Diagnosis not present

## 2023-05-01 DIAGNOSIS — Z87891 Personal history of nicotine dependence: Secondary | ICD-10-CM | POA: Diagnosis not present

## 2023-05-01 DIAGNOSIS — J9611 Chronic respiratory failure with hypoxia: Secondary | ICD-10-CM | POA: Diagnosis not present

## 2023-05-01 DIAGNOSIS — I5020 Unspecified systolic (congestive) heart failure: Secondary | ICD-10-CM | POA: Diagnosis not present

## 2023-05-01 DIAGNOSIS — R911 Solitary pulmonary nodule: Secondary | ICD-10-CM

## 2023-05-01 DIAGNOSIS — Z170 Estrogen receptor positive status [ER+]: Secondary | ICD-10-CM

## 2023-05-01 MED ORDER — ATORVASTATIN CALCIUM 80 MG PO TABS
80.0000 mg | ORAL_TABLET | Freq: Every day | ORAL | 3 refills | Status: DC
Start: 2023-05-01 — End: 2023-05-07

## 2023-05-01 NOTE — Telephone Encounter (Signed)
 Copied from CRM [id number]. Topic: General - Other >> May 01, 2023  3:33 PM Margarette Shawl wrote: Reason for CRM:   Patient is calling in regards to her visit today with Dr. Cleo Dace. They discussed her restarting use of oxygen; however, she does not have a tank at home. Will need oxygen equipment ordered, or if the clinic has tanks avail for use for the patient until she can get a tank ordered.   CB#  [phone]

## 2023-05-01 NOTE — Patient Instructions (Addendum)
-  Continue Trelegy 1 puff daily. Brush tongue and rinse mouth afterwards -Continue Albuterol inhaler 2 puffs or 3 mL neb every 6 hours as needed for shortness of breath or wheezing. Notify if symptoms persist despite rescue inhaler/neb use. You should only be using this if you need it -Restart supplemental oxygen 2 lpm continuous or 3 lpm on POC as needed with activity to maintain oxygen levels >88-90% and at night  Ensure you are using your oxygen after surgery during the recovery period to keep oxygen levels above 88-90%. Out of bed as soon as possible. Use incentive spirometer 10 times an hour while awake. Use Trelegy the morning of surgery and take your rescue inhaler with you in case you need it on the way home   Repeat CT chest in March 2026 to follow up on small lung nodule in your left lung   Follow up in 3-4 months with Dr. Villa Greaser or Gina Lagos. If symptoms dworsen, please contact office for sooner follow up or seek emergency care.

## 2023-05-01 NOTE — Progress Notes (Unsigned)
 @Patient  ID: [name], female    DOB: Jun 21, 1955, 68 y.o.   MRN: [id number]  Chief Complaint  Patient presents with   Follow-up    (See telephone contact from 04/26/2023)  This is a pre-op evaluation for oxygen.  Patient has breast cancer.  SOB with exertion.    Referring provider: Lawrance Presume, MD  HPI: 68 year old female, former smoker followed for COPD, nocturnal hypoxia. She is a patient of Dr. Hortense Lyons and last seen in office 02/27/2021. Past medical history significant for HTN, IBS, DM non-insulin dependent, depression, invasive ductal carcinoma of left breast, HLD.  TEST/EVENTS:  07/2014 PFT: no airway obstruction, ratio 73, FEV1 83%, FVC 85%, DLCO 68%, TLC 117% 11/2019 spiro: FEV1 72, FVC 98, ratio 58 10/2020 exertional walk test: desaturation 85% on room air 04/01/2023 CTA chest: cardiomegaly and trace pericardial effusion. Atherosclerosis. Emphysema. Nonspecific prominent hilar node. 4 mm LUL nodule. Atelectasis b/l. Adrenal adenoma   02/27/2021: OV with Dr. Villa Greaser. Diagnosis of sarcoidosis but no details available; never been biopsied. Used to smoke 1-1.5 ppd for more than 30 years; quit in 2010. HST reviewed - very mild OSA with significant oxygen desaturations. Planning to move back to [facility]. Has discussed with doctors in [place] and [facility] to transfer O2. Trelegy working well - would like to continue. Walk test today with SpO2 low 93% on room air. Advised to monitor and utilize as needed during the day. Continue nocturnal oxygen.   05/01/2023: Today - follow up Discussed the use of AI scribe software for clinical note transcription with the patient, who gave verbal consent to proceed.  History of Present Illness   [name] is a 68 year old female with COPD and chronic hypoxic respiratory failure who presents for follow-up regarding oxygen therapy and recent hospitalization.  She has a history of COPD and was previously on oxygen therapy. Her prior walk test here in 2023 did  not show any desaturations; however, she was advised to continue nighttime use and continue using PRN during the day. She tells me that at some point a year or so ago, her oxygen was discontinued after a walk test at her PCP. She has not been on any oxygen since, including night time use.   Recently, she was supposed to have a mastectomy but was hospitalized for a COPD exacerbation 3/17-3/19, during which she received treatment with steroids, breathing treatments, and antibiotics, leading to improvement in her symptoms. She feels back to her baseline. She was noted to be hypoxic upon arrival to the ED. She was not discharged home on any supplemental oxygen.   She was scheduled for a mastectomy for breast cancer, but the procedure was postponed due to low oxygen levels in 80's with ambulation. Was not started on any O2. Surgery date is pending.   Currently, she is on Trelegy for her COPD and uses albuterol as needed, although she uses it more frequently than necessary. She uses albuterol every day, often without feeling the need.  No cough at present. She had a new 4 mm LUL pulmonary nodule on CTA from 03/2023. She is a former smoker but quit in 2010 so does not meet criteria for lung cancer screening program.       Allergies  Allergen Reactions   Flonase [Fluticasone Propionate] Other (See Comments)    Nose bleeds    Immunization History  Administered Date(s) Administered   Fluad Quad(high Dose 65+) 10/17/2020, 01/29/2022   Fluad Trivalent(High Dose 65+) 11/15/2022  Influenza,inj,Quad PF,6+ Mos 10/30/2019   Influenza-Unspecified 10/30/2019   PFIZER(Purple Top)SARS-COV-2 Vaccination 04/09/2019, 05/08/2019   PNEUMOCOCCAL CONJUGATE-20 01/29/2022   Pfizer Covid-19 Vaccine Bivalent Booster 29yrs & up 11/08/2020   Pneumococcal Conjugate-13 02/16/2020   Tdap 03/22/2021   Zoster Recombinant(Shingrix) 11/08/2020, 03/22/2021    Past Medical History:  Diagnosis Date   Anxiety    Asthma     Breast cancer (HCC)    Chest pain    COPD (chronic obstructive pulmonary disease) (HCC)    Depression    DM type 2 (diabetes mellitus, type 2) (HCC)    Hypertension    Pneumonia    Shortness of breath dyspnea     Tobacco History: Social History   Tobacco Use  Smoking Status Former   Current packs/day: 0.00   Average packs/day: 1 pack/day for 38.0 years (38.0 ttl pk-yrs)   Types: Cigarettes   Start date: 01/15/1970   Quit date: 01/16/2008   Years since quitting: 15.3  Smokeless Tobacco Never   Counseling given: Not Answered   Outpatient Medications Prior to Visit  Medication Sig Dispense Refill   Accu-Chek Softclix Lancets lancets Use as instructed to check blood sugars.  E11.69 100 each 12   albuterol (PROAIR HFA) 108 (90 Base) MCG/ACT inhaler Inhale 1-2 puffs into the lungs every 6 (six) hours as needed for wheezing or shortness of breath. 18 g 2   albuterol (PROVENTIL) (2.5 MG/3ML) 0.083% nebulizer solution Take 3 mLs (2.5 mg total) by nebulization every 6 (six) hours as needed for wheezing or shortness of breath. 150 mL 1   amLODipine (NORVASC) 10 MG tablet Take 1 tablet (10 mg total) by mouth daily. 100 tablet 1   aspirin 81 MG tablet Take 81 mg by mouth daily.     atorvastatin (LIPITOR) 80 MG tablet Take 1 tablet (80 mg total) by mouth daily. 100 tablet 3   Blood Glucose Monitoring Suppl (ACCU-CHEK GUIDE) w/Device KIT UAD to check blood sugars daily. E11.69 1 kit 0   Camphor-Eucalyptus-Menthol (VICKS VAPORUB EX) Apply 1 application topically 2 (two) times daily as needed (breathe and soreness).     carvedilol (COREG) 12.5 MG tablet Take 1 tablet (12.5 mg total) by mouth 2 (two) times daily with a meal. 180 tablet 3   Continuous Glucose Receiver (FREESTYLE LIBRE 2 READER) DEVI Use to check glucose continuously throughout the day. E11.69 1 each 0   Continuous Glucose Sensor (FREESTYLE LIBRE 2 SENSOR) MISC Place 1 sensor on the skin every 14 days. Use to check glucose  continuously. E11.69 2 each 6   esomeprazole (NEXIUM) 40 MG capsule Take 1 capsule (40 mg total) by mouth daily. 90 capsule 3   FLUoxetine (PROZAC) 40 MG capsule TAKE 1 CAPSULE BY MOUTH DAILY 100 capsule 1   Fluticasone-Umeclidin-Vilant (TRELEGY ELLIPTA) 100-62.5-25 MCG/ACT AEPB Take 1 puff by mouth daily. 180 each 1   glucose blood (ACCU-CHEK GUIDE TEST) test strip Use as instructed to check blood sugars twice a day E11.69 100 each 12   linaclotide (LINZESS) 145 MCG CAPS capsule Take 1 capsule (145 mcg total) by mouth daily before breakfast. 100 capsule 1   lisinopril-hydrochlorothiazide (ZESTORETIC) 20-12.5 MG tablet Take 0.5 tablets by mouth daily. 50 tablet 2   metFORMIN (GLUCOPHAGE-XR) 500 MG 24 hr tablet TAKE 1 TABLET BY MOUTH TWICE  DAILY WITH MEALS 180 tablet 1   montelukast (SINGULAIR) 10 MG tablet Take 1 tablet (10 mg total) by mouth at bedtime. 90 tablet 3   nitroGLYCERIN (NITROSTAT) 0.3 MG SL  tablet DISSOLVE 1 TABLET UNDER THE  TONGUE EVERY 5 MINUTES AS NEEDED FOR CHEST PAIN. MAX OF 3 TABLETS IN 15 MINUTES. CALL 911 IF PAIN  PERSISTS. 100 tablet 3   Semaglutide, 1 MG/DOSE, 4 MG/3ML SOPN Inject 1 mg as directed once a week. 9 mL 1   tiZANidine (ZANAFLEX) 2 MG tablet Take 2 mg by mouth every 6 (six) hours as needed for muscle spasms.     traZODone (DESYREL) 50 MG tablet Take 1 tablet (50 mg total) by mouth at bedtime as needed. (Patient taking differently: Take 50 mg by mouth at bedtime as needed for sleep.) 90 tablet 3   Facility-Administered Medications Prior to Visit  Medication Dose Route Frequency Provider Last Rate Last Admin   Chlorhexidine Gluconate Cloth 2 % PADS 6 each  6 each Topical Once Manus Rudd, MD       And   Chlorhexidine Gluconate Cloth 2 % PADS 6 each  6 each Topical Once Manus Rudd, MD         Review of Systems:   Constitutional: No weight loss or gain, night sweats, fevers, chills, fatigue, or lassitude. HEENT: No headaches, difficulty swallowing,  tooth/dental problems, or sore throat. No sneezing, itching, ear ache, nasal congestion, or post nasal drip CV:  No chest pain, orthopnea, PND, swelling in lower extremities, anasarca, dizziness, palpitations, syncope Resp: + shortness of breath with exertion. No excess mucus or change in color of mucus. No productive or non-productive. No hemoptysis. No wheezing.  No chest wall deformity GI:  No heartburn, indigestion, abdominal pain, nausea, vomiting, diarrhea, change in bowel habits, loss of appetite, bloody stools.  GU: No dysuria, change in color of urine, urgency or frequency.  Skin: No rash, lesions, ulcerations MSK:  No joint pain or swelling.  Neuro: No memory changes. +ambulatory device Psych: No depression or anxiety. Mood stable.     Physical Exam:  BP 106/68 (BP Location: Right Arm, Patient Position: Sitting, Cuff Size: Normal)   Pulse 70   Temp 97.8 F (36.6 C) (Oral)   Ht 5\' 7"  (1.702 m)   Wt 187 lb 6.4 oz (85 kg)   SpO2 98%   BMI 29.35 kg/m   GEN: Pleasant, interactive, well-kempt; in no acute distress HEENT:  Normocephalic and atraumatic. PERRLA. Sclera white. Nasal turbinates pink, moist and patent bilaterally. No rhinorrhea present. Oropharynx pink and moist, without exudate or edema. No lesions, ulcerations, or postnasal drip.  NECK:  Supple w/ fair ROM. No JVD present. Thyroid symmetrical with no goiter or nodules palpated. No lymphadenopathy.   CV: RRR, no m/r/g, no peripheral edema. Pulses intact, +2 bilaterally. No cyanosis, pallor or clubbing. PULMONARY:  Unlabored, regular breathing. Clear bilaterally A&P w/o wheezes/rales/rhonchi. No accessory muscle use.  GI: BS present and normoactive. Soft, non-tender to palpation. No organomegaly or masses detected.  MSK: No erythema, warmth or tenderness. Cap refil <2 sec all extrem.   Neuro: A/Ox3. No focal deficits noted.   Skin: Warm, no lesions or rashe Psych: Normal affect and behavior. Judgement and thought  content appropriate.     Lab Results:  CBC    Component Value Date/Time   WBC 5.1 04/02/2023 0414   RBC 5.01 04/02/2023 0414   HGB 14.9 04/02/2023 0414   HGB 13.9 11/08/2020 1509   HCT 46.6 (H) 04/02/2023 0414   HCT 41.8 11/08/2020 1509   PLT 215 04/02/2023 0414   PLT 217 11/08/2020 1509   MCV 93.0 04/02/2023 0414   MCV 90 11/08/2020 1509  MCH 29.7 04/02/2023 0414   MCHC 32.0 04/02/2023 0414   RDW 12.9 04/02/2023 0414   RDW 12.6 11/08/2020 1509   LYMPHSABS 1,636 01/29/2022 1403   LYMPHSABS 1.8 11/08/2020 1509   MONOABS 0.3 08/01/2008 1640   EOSABS 57 01/29/2022 1403   EOSABS 0.1 11/08/2020 1509   BASOSABS 29 01/29/2022 1403   BASOSABS 0.0 11/08/2020 1509    BMET    Component Value Date/Time   NA 143 04/11/2023 1204   K 3.8 04/11/2023 1204   CL 103 04/11/2023 1204   CO2 23 04/11/2023 1204   GLUCOSE 105 (H) 04/11/2023 1204   GLUCOSE 193 (H) 04/02/2023 0414   BUN 13 04/11/2023 1204   CREATININE 0.74 04/11/2023 1204   CREATININE 0.65 01/29/2022 1403   CALCIUM 11.5 (H) 04/11/2023 1204   GFRNONAA >60 04/02/2023 0414   GFRAA >60 11/20/2014 1649    BNP No results found for: "BNP"   Imaging:  No results found.   Administration History     None          Latest Ref Rng & Units 08/09/2014    9:20 AM  PFT Results  FVC-Pre L 2.73   FVC-Predicted Pre % 89   FVC-Post L 2.50   FVC-Predicted Post % 82   Pre FEV1/FVC % % 73   Post FEV1/FCV % % 62   FEV1-Pre L 1.99   FEV1-Predicted Pre % 83   FEV1-Post L 1.55   DLCO uncorrected ml/min/mmHg 19.52   DLCO UNC% % 68   DLVA Predicted % 49   TLC L 6.46   TLC % Predicted % 117   RV % Predicted % 159     No results found for: "NITRICOXIDE"   QUALIFYING WALK 05/01/2023 SATURATION QUALIFICATIONS: (This note is used to comply with regulatory documentation for home oxygen)   Patient Saturations on Room Air at Rest = 97%   Patient Saturations on Room Air while Ambulating = 88%   Patient Saturations on 3  Liters POC of oxygen while Ambulating = 94%   Please briefly explain why patient needs home oxygen: Patient needs 3L pulsed oxygen (POC) while ambulating to keep SaO2 at or above 88%.     Assessment & Plan:   COPD, moderate (HCC) Moderate COPD with FEV1 72%. Resolved exacerbation and clinically improved. Appears compensated on current regimen with triple therapy. Encouraged to work on graded exercises. Action plan in place.  Patient Instructions  -Continue Trelegy 1 puff daily. Brush tongue and rinse mouth afterwards -Continue Albuterol inhaler 2 puffs or 3 mL neb every 6 hours as needed for shortness of breath or wheezing. Notify if symptoms persist despite rescue inhaler/neb use. You should only be using this if you need it -Restart supplemental oxygen 2 lpm continuous or 3 lpm on POC as needed with activity to maintain oxygen levels >88-90% and at night  Ensure you are using your oxygen after surgery during the recovery period to keep oxygen levels above 88-90%. Out of bed as soon as possible. Use incentive spirometer 10 times an hour while awake. Use Trelegy the morning of surgery and take your rescue inhaler with you in case you need it on the way home   Repeat CT chest in March 2026 to follow up on small lung nodule in your left lung   Follow up in 3-4 months with Dr. Villa Greaser or Gina Lagos. If symptoms dworsen, please contact office for sooner follow up or seek emergency care.    Chronic respiratory failure  with hypoxia (HCC) Prior oxygen dependence with exertion and nocturnal use. She had recent hospitalization for AECOPD. She is improved. Saturations are stable on room air at rest. She does require 2-3 lpm with exertion after 250 ft. Orders placed today to resume supplemental oxygen 2 lpm continuous and 3 lpm POC with activity and 2 lpm at night. Goal >88-90%  Preoperative respiratory examination Moderate to high risk. Factors that increase the risk for postoperative pulmonary  complications are chronic respiratory failure, moderate COPD with recent exacerbation, age.  Respiratory complications generally occur in 1% of ASA Class I patients, 5% of ASA Class II and 10% of ASA Class III-IV patients These complications rarely result in mortality and include postoperative pneumonia, atelectasis, pulmonary embolism, ARDS and increased time requiring postoperative mechanical ventilation.   Overall, I recommend proceeding with the surgery if the risk for respiratory complications are outweighed by the potential benefits. This will need to be discussed between the patient and surgeon.   To reduce risks of respiratory complications, I recommend: --Pre- and post-operative incentive spirometry performed frequently while awake --Inpatient use of currently prescribed supplemental oxygen and bronchodilators  --Short duration of surgery as much as possible and avoid paralytic if possible --OOB, encourage mobility post-op   1) RISK FOR PROLONGED MECHANICAL VENTILAION - > 48h  1A) Arozullah - Prolonged mech ventilation risk Arozullah Postperative Pulmonary Risk Score - for mech ventilation dependence >48h USAA, Ann Surg 2000, major non-cardiac surgery) Comment Score  Type of surgery - abd ao aneurysm (27), thoracic (21), neurosurgery / upper abdominal / vascular (21), neck (11) Mastectomy  10  Emergency Surgery - (11)  0  ALbumin < 3 or poor nutritional state - (9)  0  BUN > 30 -  (8)  0  Partial or completely dependent functional status - (7) Oxygen dependent 7  COPD -  (6) COPD 6  Age - 60 to 69 (4), > 70  (6) 67 4  TOTAL  27  Risk Stratifcation scores  - < 10 (0.5%), 11-19 (1.8%), 20-27 (4.2%), 28-40 (10.1%), >40 (26.6%)  4.2%     Malignant neoplasm of lower-outer quadrant of left breast of female, estrogen receptor positive (HCC) Awaiting mastectomy. Surgical risk stratification completed today.   Lung nodule 4 mm LUL nodule, new. Repeat CT chest in 12  months based on Fleischner criteria   Former smoker Quit 2010 with approx 38 pack year history. Not a candidate for lung cancer screening given 15 years since she quit smoking. See above. Counseled to remain smoke free.    Advised if symptoms do not improve or worsen, to please contact office for sooner follow up or seek emergency care.   I spent 45 minutes of dedicated to the care of this patient on the date of this encounter to include pre-visit review of records, face-to-face time with the patient discussing conditions above, post visit ordering of testing, clinical documentation with the electronic health record, making appropriate referrals as documented, and communicating necessary findings to members of the patients care team.  Roetta Clarke, NP 05/02/2023  Pt aware and understands NP's role.

## 2023-05-01 NOTE — Telephone Encounter (Signed)
 I spoke with the patient and told her we have ordered the O2 and one of the DME companies will reach out to her to get it delivered to her house.  Nothing further needed.

## 2023-05-02 ENCOUNTER — Telehealth: Payer: Self-pay | Admitting: Internal Medicine

## 2023-05-02 ENCOUNTER — Other Ambulatory Visit (HOSPITAL_COMMUNITY): Payer: Self-pay

## 2023-05-02 ENCOUNTER — Encounter: Payer: Self-pay | Admitting: Nurse Practitioner

## 2023-05-02 ENCOUNTER — Ambulatory Visit: Payer: Self-pay | Admitting: Nurse Practitioner

## 2023-05-02 ENCOUNTER — Telehealth: Payer: Self-pay

## 2023-05-02 ENCOUNTER — Other Ambulatory Visit: Payer: Self-pay

## 2023-05-02 DIAGNOSIS — J449 Chronic obstructive pulmonary disease, unspecified: Secondary | ICD-10-CM | POA: Diagnosis not present

## 2023-05-02 DIAGNOSIS — R911 Solitary pulmonary nodule: Secondary | ICD-10-CM | POA: Insufficient documentation

## 2023-05-02 DIAGNOSIS — Z87891 Personal history of nicotine dependence: Secondary | ICD-10-CM | POA: Insufficient documentation

## 2023-05-02 DIAGNOSIS — Z01811 Encounter for preprocedural respiratory examination: Secondary | ICD-10-CM | POA: Insufficient documentation

## 2023-05-02 DIAGNOSIS — I5020 Unspecified systolic (congestive) heart failure: Secondary | ICD-10-CM | POA: Insufficient documentation

## 2023-05-02 DIAGNOSIS — J440 Chronic obstructive pulmonary disease with acute lower respiratory infection: Secondary | ICD-10-CM | POA: Diagnosis not present

## 2023-05-02 NOTE — Assessment & Plan Note (Signed)
 Moderate COPD with FEV1 72%. Resolved exacerbation and clinically improved. Appears compensated on current regimen with triple therapy. Encouraged to work on graded exercises. Action plan in place.  Patient Instructions  -Continue Trelegy 1 puff daily. Brush tongue and rinse mouth afterwards -Continue Albuterol inhaler 2 puffs or 3 mL neb every 6 hours as needed for shortness of breath or wheezing. Notify if symptoms persist despite rescue inhaler/neb use. You should only be using this if you need it -Restart supplemental oxygen 2 lpm continuous or 3 lpm on POC as needed with activity to maintain oxygen levels >88-90% and at night  Ensure you are using your oxygen after surgery during the recovery period to keep oxygen levels above 88-90%. Out of bed as soon as possible. Use incentive spirometer 10 times an hour while awake. Use Trelegy the morning of surgery and take your rescue inhaler with you in case you need it on the way home   Repeat CT chest in March 2026 to follow up on small lung nodule in your left lung   Follow up in 3-4 months with Dr. Villa Greaser or Gina Lagos. If symptoms dworsen, please contact office for sooner follow up or seek emergency care.

## 2023-05-02 NOTE — Telephone Encounter (Signed)
 We did not place the neb order. That was her PCP.  I don't see any other messages from the patient aside from this one. She was just seen yesterday and had walk test completed. Orders were placed then. They should be getting oxygen out to her within next 24-48 hours; however, she should call the DME company to check on the status. Everything has been sent on our end. I would double check with Almira Armour but should have gone to [employer] since she has [employer]. Can call Devra Fontana with [employer] to f/u on the status on this. Thanks.

## 2023-05-02 NOTE — Telephone Encounter (Signed)
 Please advise which medication for nebulizer you prescribed for her yesterday. Patient is requesting medication be sent to [facility] [address]   Copied from CRM [id number]. Topic: Clinical - Medical Advice >> May 02, 2023 10:37 AM Hilton Lucky wrote: Reason for CRM: Patient is calling to complain that she has not heard anything from anybody. She states has not heard regarding O2. Informed it does take time to fulfill DME orders. Patient states has not gotten nebulizer medication either. EPIC indicates nebulizer sent to [facility] 04/10. Patient states she has not received any feedback on any of her messages and is very unhappy.  Please reach out to patient as updates come regarding nebulizer solution and O2. Patient is not understanding advisement that time is needed to complete requests.

## 2023-05-02 NOTE — Assessment & Plan Note (Signed)
 Awaiting mastectomy. Surgical risk stratification completed today.

## 2023-05-02 NOTE — Telephone Encounter (Signed)
 Called & spoke to the patient. Verified name & DOB. Informed that nebulizer solution was sent to [facility] on 04/25/2023. Advised patient to follow-up with [facility] to make sure medication is ready for pick-up. Patient expressed verbal understanding.

## 2023-05-02 NOTE — Telephone Encounter (Signed)
 Called & spoke to the patient. Verified name & DOB. Informed that nebulizer solution was sent to [facility] on 04/25/2023. Advised patient to follow-up with [facility] to make sure medication is ready for pick-up. Patient expressed verbal understanding.   Called pharmacy for further clarification. Pharmacist stated that prescription was received and has been picked up for delivery. Nebulizer solution will be delivered by tomorrow 05/03/2023. Called & spoke to the patient to inform that nebulizer solution will be delivered tomorrow. Patient expressed verbal understanding and will wait for delivery.

## 2023-05-02 NOTE — Assessment & Plan Note (Addendum)
 Prior oxygen dependence with exertion and nocturnal use. She had recent hospitalization for AECOPD. She is improved. Saturations are stable on room air at rest. She does require 2-3 lpm with exertion after 250 ft. Orders placed today to resume supplemental oxygen 2 lpm continuous and 3 lpm POC with activity and 2 lpm at night. Goal >88-90%

## 2023-05-02 NOTE — Telephone Encounter (Addendum)
 Copied from CRM [id number]. Topic: Clinical - Prescription Issue >> May 02, 2023 12:20 PM Juliana Ocean wrote: Reason for CRM: Rx  concerns  Agent reporting pt is very upset that office/E2C2 keeps calling her and situation not yet resolved. Pt hung up before agent able to transfer pt to nurse. Nurse attempting to call pt.  Speaking with pt. Relayed to pt from pulm office that pt needs to follow up with [facility] for her oxygen therapy supplies, number is [phone]. Pt verbalized understanding. Confirmed pt gave pharmacy her credit card info in order for the pharmacy to be able to fill and mail the nebulizer solution prescription to her since she has no transportation. Advised pt follow up with pharmacy if any issues with prescription. Pt sounds labored with her breathing on the phone but confirms that she just "went up and down the steps," confirms that she is not feeling any different from her usual, no additional symptoms, confirms she has her rescue inhaler if needed. Advised pt call pulm office at [phone] if any changes to her breathing or new symptoms. Pt verbalized understanding. No further questions or concerns at this time.  Reason for Disposition  [1] Follow-up call to recent contact AND [2] information only call, no triage required  Answer Assessment - Initial Assessment Questions 1. REASON FOR CALL or QUESTION: "What is your reason for calling today?" or "How can I best help you?" or "What question do you have that I can help answer?"     Pt was calling back to receive word back from pulm office  Protocols used: Information Only Call - No Triage-A-AH

## 2023-05-02 NOTE — Telephone Encounter (Signed)
 Copied from CRM [id number]. Topic: Clinical - Prescription Issue  >> May 02, 2023 11:03 AM Turkey B wrote:  Reason for CRM: pt called in, received nebulizer, but doesn't have medicine, please cb for further assistance

## 2023-05-02 NOTE — Telephone Encounter (Signed)
 Called the Pharmacy and inquired about this prescription being at the Pharmacy.  Pharmacy staff advised that the medication was put back in stock due to not being picked up and the patient was sent a message about a co-pay and never responded.  This RN called the patient and asked her if she had contacted the Pharmacy.  Patient states that she didn't know the medication was at the Pharmacy.  She states someone brought her the nebulizer machine and nobody gave her any medication.  Patient didn't want to call the pharmacy on her own and stated that she didn't have a ride to get to the Pharmacy to pick any medication up.  This RN called the Pharmacy   Copay of $1.78  is needed. They need a card on file at the Pharmacy per Pharmacy staff in order to fill and mail this prescription out.  Patient is very frustrated with this process. This RN called the Pharmacy back to let them know that the patient would be calling them with her card information.  They also advised that they would get in touch with her to follow up if she didn't call.

## 2023-05-02 NOTE — Telephone Encounter (Signed)
 Requested Prescriptions  Refused Prescriptions Disp Refills   amLODipine (NORVASC) 5 MG tablet [Pharmacy Med Name: amLODIPine Besylate 5 MG Oral Tablet] 90 tablet 3    Sig: TAKE 1 TABLET BY MOUTH DAILY     Cardiovascular: Calcium Channel Blockers 2 Passed - 05/02/2023  4:46 PM      Passed - Last BP in normal range    BP Readings from Last 1 Encounters:  05/01/23 106/68         Passed - Last Heart Rate in normal range    Pulse Readings from Last 1 Encounters:  05/01/23 70         Passed - Valid encounter within last 6 months    Recent Outpatient Visits           1 week ago Preop examination   [facility] [place]. [facility] Lawrance Presume, MD   3 weeks ago Hypokalemia   [facility] [place]. [facility] [place], [place], [place]   1 month ago Type 2 diabetes mellitus with obesity [facility])   [facility] [place]. [facility] Concetta Dee B, MD   2 months ago Type 2 diabetes mellitus with obesity [facility])   [facility] [place]. [facility] Freada Jacobs, [place] L, RPH-CPP   4 months ago Type 2 diabetes mellitus with obesity [facility])   [facility] - A Dept Of . [facility] Lawrance Presume, MD

## 2023-05-02 NOTE — Assessment & Plan Note (Signed)
 Moderate to high risk. Factors that increase the risk for postoperative pulmonary complications are chronic respiratory failure, moderate COPD with recent exacerbation, age.  Respiratory complications generally occur in 1% of ASA Class I patients, 5% of ASA Class II and 10% of ASA Class III-IV patients These complications rarely result in mortality and include postoperative pneumonia, atelectasis, pulmonary embolism, ARDS and increased time requiring postoperative mechanical ventilation.   Overall, I recommend proceeding with the surgery if the risk for respiratory complications are outweighed by the potential benefits. This will need to be discussed between the patient and surgeon.   To reduce risks of respiratory complications, I recommend: --Pre- and post-operative incentive spirometry performed frequently while awake --Inpatient use of currently prescribed supplemental oxygen and bronchodilators  --Short duration of surgery as much as possible and avoid paralytic if possible --OOB, encourage mobility post-op   1) RISK FOR PROLONGED MECHANICAL VENTILAION - > 48h  1A) Arozullah - Prolonged mech ventilation risk Arozullah Postperative Pulmonary Risk Score - for mech ventilation dependence >48h USAA, Ann Surg 2000, major non-cardiac surgery) Comment Score  Type of surgery - abd ao aneurysm (27), thoracic (21), neurosurgery / upper abdominal / vascular (21), neck (11) Mastectomy  10  Emergency Surgery - (11)  0  ALbumin < 3 or poor nutritional state - (9)  0  BUN > 30 -  (8)  0  Partial or completely dependent functional status - (7) Oxygen dependent 7  COPD -  (6) COPD 6  Age - 60 to 69 (4), > 70  (6) 67 4  TOTAL  27  Risk Stratifcation scores  - < 10 (0.5%), 11-19 (1.8%), 20-27 (4.2%), 28-40 (10.1%), >40 (26.6%)  4.2%

## 2023-05-02 NOTE — Assessment & Plan Note (Signed)
 Quit 2010 with approx 38 pack year history. Not a candidate for lung cancer screening given 15 years since she quit smoking. See above. Counseled to remain smoke free.

## 2023-05-02 NOTE — Telephone Encounter (Signed)
 NA. No voicemail. Patient will need to call her PCP for nebulizer medication. Patient will need to follow up with [facility] for her oxygen. [phone]. E2C2 please relay information to patient when she calls back.

## 2023-05-02 NOTE — Assessment & Plan Note (Signed)
 4 mm LUL nodule, new. Repeat CT chest in 12 months based on Fleischner criteria

## 2023-05-02 NOTE — Telephone Encounter (Signed)
 Copied from CRM [id number]. Topic: Clinical - Medication Refill >> May 02, 2023 11:22 AM Lisa Rideau B wrote: Most Recent Primary Care Visit:  Provider: Concetta Dee B  Department: [facility]  Visit Type: OFFICE VISIT  Date: 04/25/2023  Medication: albuterol (PROVENTIL) (2.5 MG/3ML) 0.083% nebulizer solution   Has the patient contacted their pharmacy? yes (Agent: If yes, when and what did the pharmacy advise?) Contact pcp Is this the correct pharmacy for this prescription? yes    [facility] [address] [place], [place] - [address] [place] [address] Phone: [phone] Fax: [phone]    Has the prescription been filled recently? no  Is the patient out of the medication? yes  Has the patient been seen for an appointment in the last year OR does the patient have an upcoming appointment? yes  Can we respond through MyChart? yes  Agent: Please be advised that Rx refills may take up to 3 business days. We ask that you follow-up with your pharmacy.

## 2023-05-06 ENCOUNTER — Telehealth: Payer: Self-pay

## 2023-05-06 ENCOUNTER — Telehealth: Payer: Self-pay | Admitting: Internal Medicine

## 2023-05-06 ENCOUNTER — Telehealth: Payer: Self-pay | Admitting: Pulmonary Disease

## 2023-05-06 ENCOUNTER — Other Ambulatory Visit (HOSPITAL_COMMUNITY): Payer: Self-pay

## 2023-05-06 NOTE — Telephone Encounter (Signed)
 Spoke to Arnold at [employer], he reports that patient has co-pay of $1.78 for her Proventil  for nebulizer. Medication was prescribed by Dr. Lincoln Renshaw.  Patient reports that she has been charged and that she will not call them back. Patient reports that she will get her bank statement and to try to get a reimbursement.  Patient reports she has called her PCP as well and has not gotten a response. NFN.

## 2023-05-06 NOTE — Telephone Encounter (Signed)
 Copied from CRM [id number]. Topic: Clinical - Prescription Issue >> May 03, 2023 11:08 AM Justina Oman C wrote: Reason for CRM: Sherline Distel from [facility] [phone] states they received a oxygen  order, but patient insurance is not covered with their company, maybe [facility] will be covered. FYI.  Routing to Dr Villa Greaser / Ardeth Beckers

## 2023-05-06 NOTE — Telephone Encounter (Signed)
-----   Message from Antonio Baumgarten sent at 04/26/2023  4:02 PM EDT ----- Regarding: RE: COPD/Pre-op eval Yes, No problem. I will ask my nurse to double book me if Dr. Villa Greaser is not available ----- Message ----- From: [place] Presume, MD Sent: 04/25/2023  10:44 PM EDT To: Lind Repine, MD; Antonio Baumgarten, NP Subject: COPD/Pre-op eval                               I am the PCP for this pt who has COPD. You last saw her in 2023. She was on home O2 but did no longer qualified for it by walk test in 08/2022.  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  She was scheduled for 04/03/2023 but it was cancel after Pox in surgeon's office was in the 80s. Ended up hospitalized with COPD exacerbation.  Did not require home O2 on discharge.  I saw her today in f/u for pre-op eval. Currently on Trelegy and ProAir .  Ordered neb device for her today. She is very anxious about wanting to move forward with having the lumpectomy with radiation seed planting done.  Is it possible for one of you to see her ASAP prior to her surgery?

## 2023-05-06 NOTE — Telephone Encounter (Signed)
 I have sent the 02 order as urgent to [facility]

## 2023-05-06 NOTE — Telephone Encounter (Signed)
 Please obtain next available appointment with APP/me for preop clearance

## 2023-05-06 NOTE — Telephone Encounter (Signed)
 Ideally would be best to see her within 1-2 weeks. I will have my nurse keep an eye on my schedule to see if there is a cancellation this week, if not double book me next week any day that I am not already

## 2023-05-06 NOTE — Telephone Encounter (Signed)
 Copied from CRM [id number]. Topic: Clinical - Medication Refill >> Apr 29, 2023 11:59 AM Elle L wrote:  Most Recent Primary Care Visit:  Provider: Concetta Dee B  Department: [facility]  Visit Type: OFFICE VISIT  Date: 04/25/2023  Medication: atorvastatin  (LIPITOR) 80 MG tablet   Has the patient contacted their pharmacy? No  Is this the correct pharmacy for this prescription? Yes   This is the patient's preferred pharmacy:   [facility] [address] [place], [place] - [address] [place] [address] Phone: [phone] Fax: [phone]  Has the prescription been filled recently? No  Is the patient out of the medication? Yes  Has the patient been seen for an appointment in the last year OR does the patient have an upcoming appointment? Yes  Can we respond through MyChart? No  Agent: Please be advised that Rx refills may take up to 3 business days. We ask that you follow-up with your pharmacy.  >> May 06, 2023  3:02 PM Emylou G wrote: Patient called.. checking status of refill: atorvastatin  (LIPITOR) 80 MG tablet

## 2023-05-07 DIAGNOSIS — J418 Mixed simple and mucopurulent chronic bronchitis: Secondary | ICD-10-CM | POA: Diagnosis not present

## 2023-05-07 MED ORDER — ATORVASTATIN CALCIUM 80 MG PO TABS: 80.0000 mg | ORAL_TABLET | Freq: Every day | ORAL | 3 refills | Status: DC

## 2023-05-07 NOTE — Addendum Note (Signed)
 Addended by: Concetta Dee B on: 05/07/2023 06:28 AM   Modules accepted: Orders

## 2023-05-07 NOTE — Telephone Encounter (Signed)
 Called & spoke to the patient. Verified name & DOB. Informed that a refill of atorvastatin  has been sent to her [facility] pharmacy. Informed that she may still get a refill from [facility] due to previous refill in January. Patient expressed verbal understanding.

## 2023-05-07 NOTE — Telephone Encounter (Signed)
 Patient was seen on 4/16 with Cobb at [place] office.

## 2023-05-07 NOTE — Telephone Encounter (Signed)
 If he had looked on the med list, he would have seen that this was sent 01/31/2023 to [facility] for 100 tabs.  Please let the patient know that.  I also went ahead and changed the prescription and sent it to [facility].  She still may receive prescription from [facility].Aaron Aas

## 2023-05-08 ENCOUNTER — Telehealth: Payer: Self-pay | Admitting: Internal Medicine

## 2023-05-08 ENCOUNTER — Other Ambulatory Visit: Payer: Self-pay

## 2023-05-08 NOTE — Telephone Encounter (Signed)
 Call to Dr. Storm Ellen office with [facility]. Dr. Storm Ellen Nurse is unavailable. Message left to advise call is from Dr. Lincoln Renshaw and related to patient's upcoming surgery. Receptionist voice she would send messages to Dr Storm Ellen nurse Loetta Ringer.

## 2023-05-08 NOTE — Telephone Encounter (Signed)
 Please call surgeon Dr. Storm Ellen office with [facility]. Please let his nurse know that pt has been seen by pulmonary last week, home O2 prescribed.  Pt is moderate risk but we can proceed with her having her surgery done from breast lumpectomy. Pulmonary made some peri-operative recommendations which is outlined in her note.  Please fax my last office note and note from pulmonary dated 05/01/2023 to them.

## 2023-05-09 ENCOUNTER — Encounter: Payer: Self-pay | Admitting: *Deleted

## 2023-05-09 NOTE — Telephone Encounter (Signed)
 Follow-up Call placed to Dr. Gates Kasal office as I have not received a return call from his Nurse.  I was advised that Loetta Ringer had left the office for the day. I requested to speak to another nurse. Spoke with the Triage Nurse, Wess Hammed. Mariah Shines of Dr. Anselmo Bast note from 05/08/2023. Alisia voiced that she would ensure that Dr. Vale Garrison and Loetta Ringer were aware. Advised that I would be faxing to the office Dr. Anselmo Bast last office note and office note from pulmonary dated 05/01/2023. Alisia asked that I put the fax to the attention of Loetta Ringer, Dr. Gates Kasal nurse.    Dr. Anselmo Bast last office note and note from pulmonary dated 05/01/2023. Faxed to [phone].

## 2023-05-11 NOTE — Telephone Encounter (Signed)
 PC placed to pt today to inquire whether she has received a call from the surgeons office as yet.  Patient states she has not.  She did receive her home oxygen  already and has a follow-up with pulmonary on 05/13/2023.  Informed her that we have reached out to Dr. Gates Kasal office this week and have sent over my note and pulmonary's peri-operative recommendations.  She can touch base with his office on Monday after she sees pulmonary again.

## 2023-05-13 ENCOUNTER — Ambulatory Visit: Admitting: Primary Care

## 2023-05-13 NOTE — Telephone Encounter (Signed)
 I completed preop risk assessment at her last OV.

## 2023-05-13 NOTE — Telephone Encounter (Addendum)
 Pre-op clearance done on 05/01/23 by Canary Ceo, apt with me can be cancelled unless she needs something further

## 2023-05-13 NOTE — Telephone Encounter (Signed)
 Spoke with pt, appointment cancelled. Routing to [employer], NP for [employer]

## 2023-05-17 ENCOUNTER — Encounter: Payer: Self-pay | Admitting: *Deleted

## 2023-05-20 ENCOUNTER — Telehealth: Payer: Self-pay | Admitting: Internal Medicine

## 2023-05-20 DIAGNOSIS — E1169 Type 2 diabetes mellitus with other specified complication: Secondary | ICD-10-CM

## 2023-05-20 NOTE — Telephone Encounter (Signed)
 I do not have a list of foods.  However if she would like to see the nutritionist I can submit a referral.

## 2023-05-20 NOTE — Telephone Encounter (Signed)
 Copied from CRM [id number]. Topic: Clinical - Medical Advice >> May 20, 2023  4:22 PM Rachelle R wrote: Reason for CRM: Patient is calling to request a list of foods and snacks that she can eat to assist in her losing weight. She has type 2 diabetes and breast cancer. States she is looking for easy meals that she is able to make on her own.  Patient is requesting a list be mailed to her at her mailing address on file. Patient can be reached at [phone]

## 2023-05-22 NOTE — Addendum Note (Signed)
 Addended by: Concetta Dee B on: 05/22/2023 08:13 AM   Modules accepted: Orders

## 2023-06-03 NOTE — Progress Notes (Signed)
 Patient states she is on 3 L O2 at home. Due to [facility] guidelines, patient is a better candidate for surgery at [facility] Main OR. Dr. Rosalita Combe office made aware.

## 2023-06-04 NOTE — Pre-Procedure Instructions (Signed)
 Surgical Instructions   Your procedure is scheduled on Tuesday, Jun 11, 2023. Report to [facility] Main Entrance "A" at 9:15 A.M., then check in with the Admitting office. Any questions or running late day of surgery: call [phone]  Questions prior to your surgery date: call [phone], Monday-Friday, 8am-4pm. If you experience any cold or flu symptoms such as cough, fever, chills, shortness of breath, etc. between now and your scheduled surgery, please notify us  at the above number.     Remember:  Do not eat after midnight the night before your surgery   You may drink clear liquids until 8:15am the morning of your surgery.   Clear liquids allowed are: Water, Non-Citrus Juices (without pulp), Carbonated Beverages, Clear Tea (no milk, honey, etc.), Black Coffee Only (NO MILK, CREAM OR POWDERED CREAMER of any kind), and Gatorade.    Take these medicines the morning of surgery with A SIP OF WATER : Amlodipine  (Norvasc ) Atorvastatin  (Lipitor) Carvedilol  (Coreg ) Esomeprazole  (Nexium ) Fluoxetine  (Prozac ) Fluticasone -Umeclidin-Vilant (Trelegy Ellipta )   May take these medicines IF NEEDED: Albuterol  (Proair  HFA) inhaler-- Please bring all inhalers with you the day of surgery.  Albuterol  (Proventil ) nebulizer Nitroglycerin  (Nitrostat ) -- Please let your nurse know if you take this Tizanidine  (Zanaflex )  Follow your surgeon's instructions when to STOP Aspirin .  If no instructions were given by your surgeon, then you will need to call the office to obtain instructions.   One week prior to surgery, STOP taking any Aspirin  (unless otherwise instructed by your surgeon) Aleve, Naproxen, Ibuprofen, Motrin, Advil, Goody's, BC's, all herbal medications, fish oil, and non-prescription vitamins.  WHAT DO I DO ABOUT MY DIABETES MEDICATION?   Do not take oral diabetes medicines (pills) the morning of surgery. DO NOT TAKE METFORMIN  (GLUCOPHAGE -XR) THE MORNING OF SURGERY.   HOW TO MANAGE  YOUR DIABETES BEFORE AND AFTER SURGERY  Why is it important to control my blood sugar before and after surgery? Improving blood sugar levels before and after surgery helps healing and can limit problems. A way of improving blood sugar control is eating a healthy diet by:  Eating less sugar and carbohydrates  Increasing activity/exercise  Talking with your doctor about reaching your blood sugar goals High blood sugars (greater than 180 mg/dL) can raise your risk of infections and slow your recovery, so you will need to focus on controlling your diabetes during the weeks before surgery. Make sure that the doctor who takes care of your diabetes knows about your planned surgery including the date and location.  How do I manage my blood sugar before surgery? Check your blood sugar at least 4 times a day, starting 2 days before surgery, to make sure that the level is not too high or low.  Check your blood sugar the morning of your surgery when you wake up and every 2 hours until you get to the Short Stay unit.  If your blood sugar is less than 70 mg/dL, you will need to treat for low blood sugar: Do not take insulin . Treat a low blood sugar (less than 70 mg/dL) with  cup of clear juice (cranberry or apple), 4 glucose tablets, OR glucose gel. Recheck blood sugar in 15 minutes after treatment (to make sure it is greater than 70 mg/dL). If your blood sugar is not greater than 70 mg/dL on recheck, call [phone] for further instructions. Report your blood sugar to the short stay nurse when you get to Short Stay.  If you are admitted to the hospital after surgery:  Your blood sugar will be checked by the staff and you will probably be given insulin  after surgery (instead of oral diabetes medicines) to make sure you have good blood sugar levels. The goal for blood sugar control after surgery is 80-180 mg/dL.                      Do NOT Smoke (Tobacco/Vaping) for 24 hours prior to your  procedure.  If you use a CPAP at night, you may bring your mask/headgear for your overnight stay.   You will be asked to remove any contacts, glasses, piercing's, hearing aid's, dentures/partials prior to surgery. Please bring cases for these items if needed.    Patients discharged the day of surgery will not be allowed to drive home, and someone needs to stay with them for 24 hours.  SURGICAL WAITING ROOM VISITATION Patients may have no more than 2 support people in the waiting area - these visitors may rotate.   Pre-op nurse will coordinate an appropriate time for 1 ADULT support person, who may not rotate, to accompany patient in pre-op.  Children under the age of 53 must have an adult with them who is not the patient and must remain in the main waiting area with an adult.  If the patient needs to stay at the hospital during part of their recovery, the visitor guidelines for inpatient rooms apply.  Please refer to the [facility] website for the visitor guidelines for any additional information.   If you received a COVID test during your pre-op visit  it is requested that you wear a mask when out in public, stay away from anyone that may not be feeling well and notify your surgeon if you develop symptoms. If you have been in contact with anyone that has tested positive in the last 10 days please notify you surgeon.      Pre-operative CHG Bathing Instructions   You can play a key role in reducing the risk of infection after surgery. Your skin needs to be as free of germs as possible. You can reduce the number of germs on your skin by washing with CHG (chlorhexidine  gluconate) soap before surgery. CHG is an antiseptic soap that kills germs and continues to kill germs even after washing.   DO NOT use if you have an allergy to chlorhexidine /CHG or antibacterial soaps. If your skin becomes reddened or irritated, stop using the CHG and notify one of our RNs at [phone].               TAKE A SHOWER THE NIGHT BEFORE SURGERY AND THE DAY OF SURGERY    Please keep in mind the following:  DO NOT shave, including legs and underarms, 48 hours prior to surgery.   You may shave your face before/day of surgery.  Place clean sheets on your bed the night before surgery Use a clean washcloth (not used since being washed) for each shower. DO NOT sleep with pet's night before surgery.  CHG Shower Instructions:  Wash your face and private area with normal soap. If you choose to wash your hair, wash first with your normal shampoo.  After you use shampoo/soap, rinse your hair and body thoroughly to remove shampoo/soap residue.  Turn the water OFF and apply half the bottle of CHG soap to a CLEAN washcloth.  Apply CHG soap ONLY FROM YOUR NECK DOWN TO YOUR TOES (washing for 3-5 minutes)  DO NOT use CHG soap on face, private  areas, open wounds, or sores.  Pay special attention to the area where your surgery is being performed.  If you are having back surgery, having someone wash your back for you may be helpful. Wait 2 minutes after CHG soap is applied, then you may rinse off the CHG soap.  Pat dry with a clean towel  Put on clean pajamas    Additional instructions for the day of surgery: DO NOT APPLY any lotions, deodorants, cologne, or perfumes.   Do not wear jewelry or makeup Do not wear nail polish, gel polish, artificial nails, or any other type of covering on natural nails (fingers and toes) Do not bring valuables to the hospital. [facility] is not responsible for valuables/personal belongings. Put on clean/comfortable clothes.  Please brush your teeth.  Ask your nurse before applying any prescription medications to the skin.

## 2023-06-05 ENCOUNTER — Encounter (HOSPITAL_COMMUNITY)
Admission: RE | Admit: 2023-06-05 | Discharge: 2023-06-05 | Disposition: A | Discharge status: Home / Self Care | Source: Ambulatory Visit | Attending: Surgery | Admitting: Surgery

## 2023-06-05 ENCOUNTER — Encounter (HOSPITAL_COMMUNITY): Payer: Self-pay

## 2023-06-05 ENCOUNTER — Other Ambulatory Visit: Payer: Self-pay

## 2023-06-05 VITALS — BP 110/68 | HR 63 | Temp 98.2°F | Resp 19 | Ht 67.0 in | Wt 183.6 lb

## 2023-06-05 DIAGNOSIS — E119 Type 2 diabetes mellitus without complications: Secondary | ICD-10-CM | POA: Diagnosis not present

## 2023-06-05 DIAGNOSIS — C50512 Malignant neoplasm of lower-outer quadrant of left female breast: Secondary | ICD-10-CM | POA: Diagnosis not present

## 2023-06-05 DIAGNOSIS — K219 Gastro-esophageal reflux disease without esophagitis: Secondary | ICD-10-CM | POA: Diagnosis not present

## 2023-06-05 DIAGNOSIS — I10 Essential (primary) hypertension: Secondary | ICD-10-CM | POA: Diagnosis not present

## 2023-06-05 DIAGNOSIS — Z01812 Encounter for preprocedural laboratory examination: Secondary | ICD-10-CM | POA: Diagnosis not present

## 2023-06-05 DIAGNOSIS — Z170 Estrogen receptor positive status [ER+]: Secondary | ICD-10-CM | POA: Diagnosis not present

## 2023-06-05 DIAGNOSIS — Z9981 Dependence on supplemental oxygen: Secondary | ICD-10-CM | POA: Insufficient documentation

## 2023-06-05 DIAGNOSIS — Z87891 Personal history of nicotine dependence: Secondary | ICD-10-CM | POA: Insufficient documentation

## 2023-06-05 DIAGNOSIS — J4489 Other specified chronic obstructive pulmonary disease: Secondary | ICD-10-CM | POA: Insufficient documentation

## 2023-06-05 DIAGNOSIS — Z01818 Encounter for other preprocedural examination: Secondary | ICD-10-CM | POA: Diagnosis present

## 2023-06-05 DIAGNOSIS — Z79899 Other long term (current) drug therapy: Secondary | ICD-10-CM | POA: Diagnosis not present

## 2023-06-05 DIAGNOSIS — G4733 Obstructive sleep apnea (adult) (pediatric): Secondary | ICD-10-CM | POA: Insufficient documentation

## 2023-06-05 HISTORY — DX: Sleep apnea, unspecified: G47.30

## 2023-06-05 HISTORY — DX: Atrial septal defect, unspecified: Q21.10

## 2023-06-05 HISTORY — DX: Unspecified osteoarthritis, unspecified site: M19.90

## 2023-06-05 HISTORY — DX: Gastro-esophageal reflux disease without esophagitis: K21.9

## 2023-06-05 HISTORY — DX: Dependence on supplemental oxygen: Z99.81

## 2023-06-05 LAB — GLUCOSE, CAPILLARY: Glucose-Capillary: 110 mg/dL — ABNORMAL HIGH (ref 70–99)

## 2023-06-05 LAB — CBC
HCT: 41.7 % (ref 36.0–46.0)
Hemoglobin: 13.7 g/dL (ref 12.0–15.0)
MCH: 29.4 pg (ref 26.0–34.0)
MCHC: 32.9 g/dL (ref 30.0–36.0)
MCV: 89.5 fL (ref 80.0–100.0)
Platelets: 194 10*3/uL (ref 150–400)
RBC: 4.66 MIL/uL (ref 3.87–5.11)
RDW: 13.8 % (ref 11.5–15.5)
WBC: 5.1 10*3/uL (ref 4.0–10.5)
nRBC: 0 % (ref 0.0–0.2)

## 2023-06-05 LAB — BASIC METABOLIC PANEL WITH GFR
Anion gap: 10 (ref 5–15)
BUN: 9 mg/dL (ref 8–23)
CO2: 22 mmol/L (ref 22–32)
Calcium: 10.6 mg/dL — ABNORMAL HIGH (ref 8.9–10.3)
Chloride: 103 mmol/L (ref 98–111)
Creatinine, Ser: 0.63 mg/dL (ref 0.44–1.00)
GFR, Estimated: >60 mL/min (ref >60)
Glucose, Bld: 98 mg/dL (ref 70–99)
Potassium: 3.5 mmol/L (ref 3.5–5.1)
Sodium: 135 mmol/L (ref 135–145)

## 2023-06-05 LAB — HEMOGLOBIN A1C
Hgb A1c MFr Bld: 6.2 % — ABNORMAL HIGH (ref 4.8–5.6)
Mean Plasma Glucose: 131.24 mg/dL

## 2023-06-05 NOTE — Progress Notes (Signed)
 PCP - Dr. Concetta Dee Cardiologist - denies Pulmonologist- Dr. Celene Coins  PPM/ICD - denies   Chest x-ray - 04/01/23 EKG - 04/01/23 Stress Test - 07/05/14 ECHO - 08/24/14 Cardiac Cath - denies  Sleep Study - OSA+ CPAP - denies, pt states she never got a CPAP machine when she moved here from [place] (that was around 7 years ago)  Fasting Blood Sugar - 90-100 Checks Blood Sugar once a day  Last dose of GLP1 agonist-  pt states she has not taken Semaglutide  since February when she had her biopsy. She will not start back until after surgery   Blood Thinner Instructions: n/a Aspirin  Instructions: hold 7 days. Last dose 5/19  ERAS Protcol - clears until 0815   COVID TEST- n/a   Anesthesia review: yes, pulmonary hx. Pt's surgery was previously cancelled due to low O2 sats at surgeon's office  Patient denies shortness of breath, fever, cough and chest pain at PAT appointment   All instructions explained to the patient, with a verbal understanding of the material. Patient agrees to go over the instructions while at home for a better understanding. The opportunity to ask questions was provided.

## 2023-06-06 ENCOUNTER — Encounter (HOSPITAL_COMMUNITY): Payer: Self-pay

## 2023-06-06 NOTE — Anesthesia Preprocedure Evaluation (Signed)
 Anesthesia Evaluation  Patient identified by MRN, date of birth, ID band Patient awake    Reviewed: Allergy & Precautions, NPO status , Patient's Chart, lab work & pertinent test results  Airway Mallampati: I  TM Distance: >3 FB Neck ROM: Full    Dental  (+) Edentulous Upper, Edentulous Lower   Pulmonary asthma , sleep apnea , COPD, former smoker    + decreased breath sounds      Cardiovascular hypertension, Pt. on medications and Pt. on home beta blockers  Rhythm:Regular Rate:Normal     Neuro/Psych  PSYCHIATRIC DISORDERS Anxiety Depression    negative neurological ROS     GI/Hepatic Neg liver ROS,GERD  Medicated,,  Endo/Other  diabetes, Type 2, Oral Hypoglycemic Agents    Renal/GU negative Renal ROS     Musculoskeletal  (+) Arthritis ,    Abdominal   Peds  Hematology   Anesthesia Other Findings   Reproductive/Obstetrics                             Anesthesia Physical Anesthesia Plan  ASA: 3  Anesthesia Plan: General   Post-op Pain Management: Tylenol  PO (pre-op)* and Toradol IV (intra-op)*   Induction: Intravenous  PONV Risk Score and Plan: 4 or greater and Ondansetron , Dexamethasone , Scopolamine patch - Pre-op and Midazolam   Airway Management Planned: LMA  Additional Equipment: None  Intra-op Plan:   Post-operative Plan: Extubation in OR  Informed Consent: I have reviewed the patients History and Physical, chart, labs and discussed the procedure including the risks, benefits and alternatives for the proposed anesthesia with the patient or authorized representative who has indicated his/her understanding and acceptance.       Plan Discussed with: CRNA  Anesthesia Plan Comments: (PAT note written 06/06/2023 by Cheryle Dark, PA-C.  Duoneb administered in preop.  )       Anesthesia Quick Evaluation

## 2023-06-06 NOTE — Progress Notes (Signed)
 Anesthesia Chart Review:  Case: [id number] Date/Time: 06/11/23 1100   Procedure: BREAST LUMPECTOMY WITH RADIOACTIVE SEED LOCALIZATION (Left: Breast) - LEFT BREAST RADIOACTIVE SEED LOCALIZED LUMPECTOMY   Anesthesia type: General   Diagnosis: Malignant neoplasm of lower-outer quadrant of left breast of female, estrogen receptor positive (HCC) [C50.512, Z17.0]   Pre-op diagnosis: INVASIVE DUCTAL CARCINOMA LEFT BREAST   [facility]: [facility] ROOM 08 / [facility]   Surgeons: Dareen Ebbing, MD       DISCUSSION: Patient is a 68 year old female scheduled for the above procedure. She is on home O2 at 3L/[place], so case moved from [facility] to [facility] Main OR. Surgery also delayed from March 2025 due to hospitalization for COPD exacerbation. Preoperative pulmomonology follow-up and risk assessment summarized below.  History includes former smoker (quit 2020), COPD, dyspnea, home O2 (3L/[place] at night and as needed), OSA (does not use CPAP). 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   Preoperative pulmonology risk assessment done on 05/01/23 at visit with Girard Lam, NP.  Patient felt recovered for COPD exacerbation. She had reported that she was not using home O2 because at some point O2 was discontinued after a walk test at her PCP.  Walk test repeated with RA O2 sats while walking at 88%, so advised resuming supplemental oxygen  2 lpm continuous or 3 lpm on POC as needed with activity to maintain oxygen  levels >88-90% and at night  Continue Trelegy for COPD and as needed albuterol , Repeat chest CT in March 2026 to follow-up 4mm LUL lung nodule. Provider follow-up in 3-4 months. In regards to surgery: "Preoperative respiratory examination Moderate to high risk. Factors that increase the risk for postoperative pulmonary complications are chronic respiratory  failure, moderate COPD with recent exacerbation, age.   Respiratory complications generally occur in 1% of ASA Class I patients, 5% of ASA Class II and 10% of ASA Class III-IV patients These complications rarely result in mortality and include postoperative pneumonia, atelectasis, pulmonary embolism, ARDS and increased time requiring postoperative mechanical ventilation.   Overall, I recommend proceeding with the surgery if the risk for respiratory complications are outweighed by the potential benefits. This will need to be discussed between the patient and surgeon...." [See Progress Note for full summary of risk assessment.]   A1c 6.2%. She is on metformin . She has not taken semaglutide  since 02/2023.   She reported last ASA 06/03/23.  Anesthesia team to evaluate on the day of surgery. RSL scheduled for 06/07/23 at 11:30 AM.    VS: BP 110/68   Pulse 63   Temp 36.8 C   Resp 19   Ht 5\' 7"  (1.702 m)   Wt 83.3 kg   SpO2 98%   BMI 28.76 kg/m   PROVIDERS: Lawrance Presume, MD is PCP  Celene Coins, MD is pulmonologist Murleen Arms, MD is Kae Oram, MD is RAD-ONC - She is not routinely followed by cardiology, but had an evaluation by Knox Perl, MD in 2016 for dyspnea and chest pain.  See testing below.    LABS: Labs reviewed: Acceptable for surgery. Ca 10.6, previously 10.8-12.2 since 11/15/22 (followed by PCP. TSH, VitD, Parathyroid  hormone levels normal 02/23/23).  (all labs ordered are listed, but only abnormal results are displayed)  Labs Reviewed  GLUCOSE, CAPILLARY - Abnormal; Notable for the following components:      Result Value  Glucose-Capillary 110 (*)    All other components within normal limits  HEMOGLOBIN A1C - Abnormal; Notable for the following components:   Hgb A1c MFr Bld 6.2 (*)    All other components within normal limits  BASIC METABOLIC PANEL WITH GFR - Abnormal; Notable for the following components:   Calcium  10.6 (*)    All other components  within normal limits  CBC    OTHER: Pulmonary studies as outlined in [place] [facility] notes: - 05/01/2023 Walk Test: O2 sat at rest 97%. O2 sat on RA while ambulating 88%. O2 sat on 3L/[place] while ambulating 94%. Patient needs 3L pulsed O2 while ambulating to keep SaO2 at or above 88%.   - 04/01/2023 CTA chest: cardiomegaly and trace pericardial effusion. Atherosclerosis. Emphysema. Nonspecific prominent hilar node. 4 mm LUL nodule. Atelectasis b/l. Adrenal adenoma   - 01/23/2021 Overnight pulse oximetry: ONO on 2 L did not show any significant desaturations. Only 6.9 minutes less than 88%. Continue on 2 L of oxygen .  - 01/03/2021 HST: mild OSA, AHI 6/hr, lowest saturation 72%  - 11/2019 Spirometry: FEV1 1,63/72%, FVC 98%, ratio 58   - 07/2014 PFT: no airway obstruction, ratio 73, FEV1 83%, FVC 85%, DLCO 19.5/68%, TLC 117%     IMAGES: CTA Chest 04/01/23: IMPRESSION: 1. No evidence of pulmonary embolism or other acute process. 2. Emphysema. 3. 4 mm left upper lobe pulmonary nodule. No follow-up needed if patient is low-risk.This recommendation follows the consensus statement: Guidelines for Management of Incidental Pulmonary Nodules Detected on CT Images: From the Fleischner Society 2017; Radiology 2017; 284:228-243. 4. Left adrenal adenoma. 5. Coronary artery calcifications and aortic atherosclerosis.   EKG: 04/01/23:  Sinus rhythm Prolonged PR interval Incomplete left bundle branch block LVH with secondary repolarization abnormality Borderline prolonged QT interval similar to prior EKGs Confirmed by Hershel Los [phone]) on 04/01/2023 9:24:08 PM   CV: TEE 08/24/14: Study Conclusions  - Left ventricle: The cavity size was normal. Wall thickness was    normal. Systolic function was normal. The estimated ejection    fraction was in the range of 55% to 60%.  - Left atrium: No evidence of thrombus in the atrial cavity or    appendage.  - Atrial septum: Very small insignificant  fenestrated atrial septal    defect (2 defects) with very mildly positive double contrast    study for right to left shunting.   As outlined in 08/19/14 H&P by Dr. Knox Perl: Echocardiogram 07/21/2014: Left ventricle cavity is normal in size. Severe concentric hypertrophy of the left ventricle. The LV septum and posterior wall measure 2cm. Normal global wall motion. Doppler evidence of grade II (pseudonormal) diastolic dysfunction. Diastolic dysfunction findings suggests elevated LA/LV end diastolic pressure. Calculated EF 56%. Left atrial cavity is moderately dilated. 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   Lexiscan myoview stress test 07/05/2014: 1. The resting electrocardiogram demonstrated normal sinus rhythm. LVH. Stress EKG is non-diagnostic for ischemia as it a pharmacologic stress using Lexiscan. tress symptoms included dyspnea. 2. The perfusion imaging study demonstrates the left ventricle to be mildly dilated both in rest and stress images with left ventricular end diastolic volume of 133 mL. There was uniform uptake of radioisotope, no evidence of ischemia. LVEF was preserved at 63%. This is a low risk study.    Past Medical History:  Diagnosis Date   Anxiety    Arthritis    Asthma    Breast cancer (HCC)    left  Chest pain    COPD (chronic obstructive pulmonary disease) (HCC)    Depression    DM type 2 (diabetes mellitus, type 2) (HCC)    GERD (gastroesophageal reflux disease)    Hypertension    Pneumonia    Shortness of breath dyspnea    Sleep apnea    does not use CPAP    Past Surgical History:  Procedure Laterality Date   BREAST BIOPSY Left 03/11/2023   US  LT BREAST BX W LOC DEV 1ST LESION IMG BX SPEC US  GUIDE 03/11/2023 [facility] MAMMOGRAPHY   TEE WITHOUT CARDIOVERSION N/A 08/24/2014   Procedure: TRANSESOPHAGEAL ECHOCARDIOGRAM (TEE);  Surgeon: Knox Perl, MD;  Location: [facility] ENDOSCOPY;  Service: Cardiovascular;  Laterality: N/A;    TOTAL ABDOMINAL HYSTERECTOMY  01/15/2005    MEDICATIONS:  Accu-Chek Softclix Lancets lancets   albuterol  (PROAIR  HFA) 108 (90 Base) MCG/ACT inhaler   albuterol  (PROVENTIL ) (2.5 MG/3ML) 0.083% nebulizer solution   amLODipine  (NORVASC ) 10 MG tablet   aspirin  81 MG tablet   atorvastatin  (LIPITOR) 80 MG tablet   Blood Glucose Monitoring Suppl (ACCU-CHEK GUIDE) w/Device KIT   carvedilol  (COREG ) 12.5 MG tablet   Continuous Glucose Receiver (FREESTYLE LIBRE 2 READER) DEVI   Continuous Glucose Sensor (FREESTYLE LIBRE 2 SENSOR) MISC   esomeprazole  (NEXIUM ) 40 MG capsule   FLUoxetine  (PROZAC ) 40 MG capsule   Fluticasone -Umeclidin-Vilant (TRELEGY ELLIPTA ) 100-62.5-25 MCG/ACT AEPB   glucose blood (ACCU-CHEK GUIDE TEST) test strip   linaclotide  (LINZESS ) 145 MCG CAPS capsule   lisinopril -hydrochlorothiazide  (ZESTORETIC ) 20-12.5 MG tablet   metFORMIN  (GLUCOPHAGE -XR) 500 MG 24 hr tablet   montelukast  (SINGULAIR ) 10 MG tablet   nitroGLYCERIN  (NITROSTAT ) 0.3 MG SL tablet   [Paused] Semaglutide , 1 MG/DOSE, 4 MG/3ML SOPN   tiZANidine  (ZANAFLEX ) 2 MG tablet   traZODone  (DESYREL ) 50 MG tablet   No current facility-administered medications for this encounter.    Chlorhexidine  Gluconate Cloth 2 % PADS 6 each   And   Chlorhexidine  Gluconate Cloth 2 % PADS 6 each    Rosemae Mcquown, PA-C Surgical Short Stay/Anesthesiology [facility] Phone [phone] [facility] Phone [phone] 06/06/2023 10:19 AM

## 2023-06-07 ENCOUNTER — Ambulatory Visit
Admission: RE | Admit: 2023-06-07 | Discharge: 2023-06-07 | Disposition: A | Discharge status: Home / Self Care | Source: Ambulatory Visit | Attending: Surgery | Admitting: Surgery

## 2023-06-07 ENCOUNTER — Other Ambulatory Visit: Payer: Self-pay | Admitting: Surgery

## 2023-06-07 ENCOUNTER — Encounter

## 2023-06-07 DIAGNOSIS — C50912 Malignant neoplasm of unspecified site of left female breast: Secondary | ICD-10-CM

## 2023-06-07 DIAGNOSIS — D0512 Intraductal carcinoma in situ of left breast: Secondary | ICD-10-CM | POA: Diagnosis not present

## 2023-06-07 DIAGNOSIS — R92322 Mammographic fibroglandular density, left breast: Secondary | ICD-10-CM | POA: Diagnosis not present

## 2023-06-07 HISTORY — PX: BREAST BIOPSY: SHX20

## 2023-06-11 ENCOUNTER — Encounter (HOSPITAL_COMMUNITY): Payer: Self-pay | Admitting: Surgery

## 2023-06-11 ENCOUNTER — Ambulatory Visit
Admission: RE | Admit: 2023-06-11 | Discharge: 2023-06-11 | Disposition: A | Discharge status: Home / Self Care | Source: Ambulatory Visit | Attending: Surgery | Admitting: Surgery

## 2023-06-11 ENCOUNTER — Other Ambulatory Visit: Payer: Self-pay

## 2023-06-11 ENCOUNTER — Ambulatory Visit (HOSPITAL_BASED_OUTPATIENT_CLINIC_OR_DEPARTMENT_OTHER)

## 2023-06-11 ENCOUNTER — Encounter

## 2023-06-11 ENCOUNTER — Ambulatory Visit (HOSPITAL_COMMUNITY)
Admission: RE | Admit: 2023-06-11 | Discharge: 2023-06-11 | Disposition: A | Discharge status: Home / Self Care | Attending: Surgery | Admitting: Surgery

## 2023-06-11 ENCOUNTER — Encounter (HOSPITAL_COMMUNITY)
Admission: RE | Disposition: A | Discharge status: Home / Self Care | Payer: Self-pay | Source: Home / Self Care | Attending: Surgery

## 2023-06-11 ENCOUNTER — Ambulatory Visit (HOSPITAL_COMMUNITY): Admitting: Vascular Surgery

## 2023-06-11 DIAGNOSIS — G4730 Sleep apnea, unspecified: Secondary | ICD-10-CM | POA: Insufficient documentation

## 2023-06-11 DIAGNOSIS — I10 Essential (primary) hypertension: Secondary | ICD-10-CM

## 2023-06-11 DIAGNOSIS — Z87891 Personal history of nicotine dependence: Secondary | ICD-10-CM | POA: Diagnosis not present

## 2023-06-11 DIAGNOSIS — J449 Chronic obstructive pulmonary disease, unspecified: Secondary | ICD-10-CM

## 2023-06-11 DIAGNOSIS — F32A Depression, unspecified: Secondary | ICD-10-CM | POA: Insufficient documentation

## 2023-06-11 DIAGNOSIS — Z170 Estrogen receptor positive status [ER+]: Secondary | ICD-10-CM | POA: Insufficient documentation

## 2023-06-11 DIAGNOSIS — F419 Anxiety disorder, unspecified: Secondary | ICD-10-CM | POA: Diagnosis not present

## 2023-06-11 DIAGNOSIS — Z7985 Long-term (current) use of injectable non-insulin antidiabetic drugs: Secondary | ICD-10-CM | POA: Insufficient documentation

## 2023-06-11 DIAGNOSIS — E119 Type 2 diabetes mellitus without complications: Secondary | ICD-10-CM | POA: Insufficient documentation

## 2023-06-11 DIAGNOSIS — Z1732 Human epidermal growth factor receptor 2 negative status: Secondary | ICD-10-CM | POA: Diagnosis not present

## 2023-06-11 DIAGNOSIS — J4489 Other specified chronic obstructive pulmonary disease: Secondary | ICD-10-CM | POA: Insufficient documentation

## 2023-06-11 DIAGNOSIS — C50912 Malignant neoplasm of unspecified site of left female breast: Secondary | ICD-10-CM | POA: Diagnosis not present

## 2023-06-11 DIAGNOSIS — C50512 Malignant neoplasm of lower-outer quadrant of left female breast: Secondary | ICD-10-CM | POA: Insufficient documentation

## 2023-06-11 DIAGNOSIS — K219 Gastro-esophageal reflux disease without esophagitis: Secondary | ICD-10-CM | POA: Insufficient documentation

## 2023-06-11 DIAGNOSIS — Z1721 Progesterone receptor positive status: Secondary | ICD-10-CM | POA: Diagnosis not present

## 2023-06-11 DIAGNOSIS — Z79899 Other long term (current) drug therapy: Secondary | ICD-10-CM | POA: Insufficient documentation

## 2023-06-11 DIAGNOSIS — Z7984 Long term (current) use of oral hypoglycemic drugs: Secondary | ICD-10-CM | POA: Insufficient documentation

## 2023-06-11 DIAGNOSIS — D0512 Intraductal carcinoma in situ of left breast: Secondary | ICD-10-CM | POA: Diagnosis not present

## 2023-06-11 HISTORY — PX: BREAST LUMPECTOMY WITH RADIOACTIVE SEED LOCALIZATION: SHX6424

## 2023-06-11 LAB — GLUCOSE, CAPILLARY
Glucose-Capillary: 132 mg/dL — ABNORMAL HIGH (ref 70–99)
Glucose-Capillary: 134 mg/dL — ABNORMAL HIGH (ref 70–99)

## 2023-06-11 SURGERY — BREAST LUMPECTOMY WITH RADIOACTIVE SEED LOCALIZATION
Anesthesia: General | Site: Breast | Laterality: Left

## 2023-06-11 MED ORDER — CEFAZOLIN SODIUM-DEXTROSE 2-4 GM/100ML-% IV SOLN
2.0000 g | INTRAVENOUS | Status: AC
  Administered 2023-06-11: 2 g via INTRAVENOUS

## 2023-06-11 MED ORDER — LIDOCAINE 2% (20 MG/ML) 5 ML SYRINGE
INTRAMUSCULAR | Status: AC
  Filled 2023-06-11: qty 5

## 2023-06-11 MED ORDER — BUPIVACAINE-EPINEPHRINE (PF) 0.25% -1:200000 IJ SOLN
INTRAMUSCULAR | Status: AC
  Filled 2023-06-11: qty 30

## 2023-06-11 MED ORDER — CEFAZOLIN SODIUM-DEXTROSE 2-4 GM/100ML-% IV SOLN
INTRAVENOUS | Status: AC
Start: 2023-06-11 — End: 2023-06-11
  Filled 2023-06-11: qty 100

## 2023-06-11 MED ORDER — LIDOCAINE 2% (20 MG/ML) 5 ML SYRINGE
INTRAMUSCULAR | Status: DC | PRN
  Administered 2023-06-11: 40 mg via INTRAVENOUS

## 2023-06-11 MED ORDER — PROPOFOL 10 MG/ML IV BOLUS
INTRAVENOUS | Status: AC
  Filled 2023-06-11: qty 20

## 2023-06-11 MED ORDER — IPRATROPIUM-ALBUTEROL 0.5-2.5 (3) MG/3ML IN SOLN
RESPIRATORY_TRACT | Status: DC
Start: 2023-06-11 — End: 2023-06-11
  Filled 2023-06-11: qty 3

## 2023-06-11 MED ORDER — MIDAZOLAM HCL 2 MG/2ML IJ SOLN
INTRAMUSCULAR | Status: DC | PRN
  Administered 2023-06-11: 1 mg via INTRAVENOUS

## 2023-06-11 MED ORDER — ONDANSETRON HCL 4 MG/2ML IJ SOLN
INTRAMUSCULAR | Status: DC | PRN
  Administered 2023-06-11: 4 mg via INTRAVENOUS

## 2023-06-11 MED ORDER — FENTANYL CITRATE (PF) 250 MCG/5ML IJ SOLN
INTRAMUSCULAR | Status: AC
Start: 2023-06-11 — End: ?
  Filled 2023-06-11: qty 5

## 2023-06-11 MED ORDER — FENTANYL CITRATE (PF) 100 MCG/2ML IJ SOLN: 25.0000 ug | INTRAMUSCULAR | Status: DC | PRN

## 2023-06-11 MED ORDER — CHLORHEXIDINE GLUCONATE 0.12 % MT SOLN: 15.0000 mL | Freq: Once | OROMUCOSAL | Status: AC

## 2023-06-11 MED ORDER — PROPOFOL 10 MG/ML IV BOLUS
INTRAVENOUS | Status: DC | PRN
  Administered 2023-06-11: 120 mg via INTRAVENOUS

## 2023-06-11 MED ORDER — OXYCODONE HCL 5 MG PO TABS: 5.0000 mg | ORAL_TABLET | Freq: Once | ORAL | Status: DC | PRN

## 2023-06-11 MED ORDER — 0.9 % SODIUM CHLORIDE (POUR BTL) OPTIME
TOPICAL | Status: DC | PRN
  Administered 2023-06-11: 1000 mL

## 2023-06-11 MED ORDER — ONDANSETRON HCL 4 MG/2ML IJ SOLN
INTRAMUSCULAR | Status: AC
  Filled 2023-06-11: qty 2

## 2023-06-11 MED ORDER — DEXAMETHASONE SODIUM PHOSPHATE 10 MG/ML IJ SOLN
INTRAMUSCULAR | Status: AC
  Filled 2023-06-11: qty 1

## 2023-06-11 MED ORDER — IPRATROPIUM-ALBUTEROL 0.5-2.5 (3) MG/3ML IN SOLN
3.0000 mL | Freq: Once | RESPIRATORY_TRACT | Status: AC
  Administered 2023-06-11: 3 mL via RESPIRATORY_TRACT

## 2023-06-11 MED ORDER — FENTANYL CITRATE (PF) 250 MCG/5ML IJ SOLN
INTRAMUSCULAR | Status: DC | PRN
  Administered 2023-06-11: 50 ug via INTRAVENOUS

## 2023-06-11 MED ORDER — ACETAMINOPHEN 500 MG PO TABS: 1000.0000 mg | ORAL_TABLET | ORAL | Status: AC

## 2023-06-11 MED ORDER — ACETAMINOPHEN 500 MG PO TABS
ORAL_TABLET | ORAL | Status: AC
  Administered 2023-06-11: 1000 mg via ORAL
  Filled 2023-06-11: qty 2

## 2023-06-11 MED ORDER — DEXAMETHASONE SODIUM PHOSPHATE 10 MG/ML IJ SOLN
INTRAMUSCULAR | Status: DC | PRN
  Administered 2023-06-11: 5 mg via INTRAVENOUS

## 2023-06-11 MED ORDER — LACTATED RINGERS IV SOLN: INTRAVENOUS | Status: DC

## 2023-06-11 MED ORDER — OXYCODONE HCL 5 MG/5ML PO SOLN: 5.0000 mg | Freq: Once | ORAL | Status: DC | PRN

## 2023-06-11 MED ORDER — INSULIN ASPART 100 UNIT/ML IJ SOLN: 0.0000 [IU] | INTRAMUSCULAR | Status: DC | PRN

## 2023-06-11 MED ORDER — ORAL CARE MOUTH RINSE: 15.0000 mL | Freq: Once | OROMUCOSAL | Status: AC

## 2023-06-11 MED ORDER — DROPERIDOL 2.5 MG/ML IJ SOLN: 0.6250 mg | Freq: Once | INTRAMUSCULAR | Status: DC | PRN

## 2023-06-11 MED ORDER — BUPIVACAINE-EPINEPHRINE 0.25% -1:200000 IJ SOLN
INTRAMUSCULAR | Status: DC | PRN
  Administered 2023-06-11: 10 mL

## 2023-06-11 MED ORDER — MIDAZOLAM HCL 2 MG/2ML IJ SOLN
INTRAMUSCULAR | Status: AC
  Filled 2023-06-11: qty 2

## 2023-06-11 MED ORDER — ACETAMINOPHEN 10 MG/ML IV SOLN: 1000.0000 mg | Freq: Once | INTRAVENOUS | Status: DC | PRN

## 2023-06-11 MED ORDER — CHLORHEXIDINE GLUCONATE 0.12 % MT SOLN
OROMUCOSAL | Status: AC
  Administered 2023-06-11: 15 mL via OROMUCOSAL
  Filled 2023-06-11: qty 15

## 2023-06-11 SURGICAL SUPPLY — 32 items
BENZOIN TINCTURE PRP APPL 2/3 (GAUZE/BANDAGES/DRESSINGS) ×2 IMPLANT
CANISTER SUCTION 3000ML PPV (SUCTIONS) ×2 IMPLANT
CHLORAPREP W/TINT 26 (MISCELLANEOUS) ×2 IMPLANT
CLIP APPLIE 9.375 MED OPEN (MISCELLANEOUS) IMPLANT
CLSR STERI-STRIP ANTIMIC 1/2X4 (GAUZE/BANDAGES/DRESSINGS) IMPLANT
COVER PROBE W GEL 5X96 (DRAPES) ×2 IMPLANT
COVER SURGICAL LIGHT HANDLE (MISCELLANEOUS) ×2 IMPLANT
DEVICE DUBIN SPECIMEN MAMMOGRA (MISCELLANEOUS) ×2 IMPLANT
DRAPE CHEST BREAST 15X10 FENES (DRAPES) ×2 IMPLANT
DRSG TEGADERM 4X4.75 (GAUZE/BANDAGES/DRESSINGS) ×2 IMPLANT
ELECT CAUTERY BLADE 6.4 (BLADE) ×2 IMPLANT
ELECTRODE REM PT RTRN 9FT ADLT (ELECTROSURGICAL) ×2 IMPLANT
GAUZE SPONGE 2X2 8PLY STRL LF (GAUZE/BANDAGES/DRESSINGS) ×2 IMPLANT
GLOVE BIO SURGEON STRL SZ7 (GLOVE) ×2 IMPLANT
GLOVE BIOGEL PI IND STRL 7.5 (GLOVE) ×2 IMPLANT
GOWN STRL REUS W/ TWL LRG LVL3 (GOWN DISPOSABLE) ×4 IMPLANT
ILLUMINATOR WAVEGUIDE N/F (MISCELLANEOUS) IMPLANT
KIT BASIN OR (CUSTOM PROCEDURE TRAY) ×2 IMPLANT
KIT MARKER MARGIN INK (KITS) ×2 IMPLANT
LIGHT WAVEGUIDE WIDE FLAT (MISCELLANEOUS) IMPLANT
NDL HYPO 25GX1X1/2 BEV (NEEDLE) ×2 IMPLANT
NEEDLE HYPO 25GX1X1/2 BEV (NEEDLE) ×1 IMPLANT
NS IRRIG 1000ML POUR BTL (IV SOLUTION) ×2 IMPLANT
PACK GENERAL/GYN (CUSTOM PROCEDURE TRAY) ×2 IMPLANT
SPONGE T-LAP 4X18 ~~LOC~~+RFID (SPONGE) ×2 IMPLANT
STRIP CLOSURE SKIN 1/2X4 (GAUZE/BANDAGES/DRESSINGS) ×2 IMPLANT
SUT MNCRL AB 4-0 PS2 18 (SUTURE) ×2 IMPLANT
SUT SILK 2 0 SH (SUTURE) IMPLANT
SUT VIC AB 3-0 SH 27X BRD (SUTURE) ×2 IMPLANT
SYR CONTROL 10ML LL (SYRINGE) ×2 IMPLANT
TOWEL GREEN STERILE (TOWEL DISPOSABLE) ×2 IMPLANT
TOWEL GREEN STERILE FF (TOWEL DISPOSABLE) ×2 IMPLANT

## 2023-06-11 NOTE — Op Note (Signed)
 Pre-op Diagnosis:  Invasive ductal carcinoma left breast Post-op Diagnosis: same Procedure:  Left breast radioactive seed localized lumpectomy Surgeon:  Davi Rotan K. Anesthesia:  GEN - LMA Indications:  This is a 68 year old female with DM2, COPD, HTN, asthma who presented after a recent screening mammogram revealed a 7 x 8 x 6 mm mass at 6:00 in the left breast 3 cmfn. Axillary ultrasound revealed a mildly thickened lymph node. 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 Biopsy of the axillary lymph node was negative. No family history of breast cancer. No previous breast problems or breast surgery.   Scheduling of her surgery has been delayed due to medical issues with the patient.  Description of procedure: The patient is brought to the operating room placed in supine position on the operating room table. After an adequate level of general anesthesia was obtained, her left breast was prepped with ChloraPrep and draped in sterile fashion. A timeout was taken to ensure the proper patient and proper procedure. We interrogated the breast with the neoprobe. We made a circumareolar incision around the lower side of the nipple after infiltrating with 0.25% Marcaine . Dissection was carried down in the breast tissue with cautery. We used the neoprobe to guide us  towards the radioactive seed. We excised an area of tissue around the radioactive seed 2 cm in diameter. The specimen was removed and was oriented with a paint kit. Specimen mammogram showed the radioactive seed as well as the biopsy clip within the specimen. This was sent for pathologic examination. There is no residual radioactivity within the biopsy cavity. We inspected carefully for hemostasis. The wound was thoroughly irrigated. The wound was closed with a deep layer of 3-0 Vicryl and a subcuticular layer of 4-0 Monocryl. Benzoin Steri-Strips were applied. The patient was then extubated and brought  to the recovery room in stable condition. All sponge, instrument, and needle counts are correct.  Kari Otto. Eli Grizzle, MD, [facility] Surgery  General/ Trauma Surgery  06/11/2023 12:10 PM

## 2023-06-11 NOTE — Anesthesia Postprocedure Evaluation (Signed)
 Anesthesia Post Note  Patient: [name]  Procedure(s) Performed: BREAST LUMPECTOMY WITH RADIOACTIVE SEED LOCALIZATION (Left: Breast)     Patient location during evaluation: PACU Anesthesia Type: General Level of consciousness: awake and alert Pain management: pain level controlled Vital Signs Assessment: post-procedure vital signs reviewed and stable Respiratory status: spontaneous breathing, nonlabored ventilation, respiratory function stable and patient connected to nasal cannula oxygen  Cardiovascular status: blood pressure returned to baseline and stable Postop Assessment: no apparent nausea or vomiting Anesthetic complications: no  No notable events documented.  Last Vitals:  Vitals:   06/11/23 1327 06/11/23 1328  BP: 132/70   Pulse: 65 62  Resp: 16 (!) 22  Temp: 36.7 C   SpO2: 99% 96%    Last Pain:  Vitals:   06/11/23 1327  PainSc: 0-No pain                 Willian Harrow

## 2023-06-11 NOTE — Transfer of Care (Signed)
 Immediate Anesthesia Transfer of Care Note  Patient: [name]  Procedure(s) Performed: BREAST LUMPECTOMY WITH RADIOACTIVE SEED LOCALIZATION (Left: Breast)  Patient Location: PACU  Anesthesia Type:General  Level of Consciousness: awake  Airway & Oxygen  Therapy: Patient Spontanous Breathing and Patient connected to nasal cannula oxygen   Post-op Assessment: Report given to RN and Post -op Vital signs reviewed and stable  Post vital signs: Reviewed and stable  Last Vitals:  Vitals Value Taken Time  BP 147/40 06/11/23 1216  Temp    Pulse 69 06/11/23 1219  Resp 20 06/11/23 1219  SpO2 93 % 06/11/23 1219  Vitals shown include unfiled device data.  Last Pain:  Vitals:   06/11/23 0924  PainSc: 0-No pain         Complications: No notable events documented.

## 2023-06-11 NOTE — Discharge Instructions (Signed)

## 2023-06-11 NOTE — Anesthesia Procedure Notes (Signed)
 Procedure Name: LMA Insertion Date/Time: 06/11/2023 11:26 AM  Performed by: Rochelle Chu, CRNAPre-anesthesia Checklist: Patient identified, Emergency Drugs available, Suction available, Patient being monitored and Timeout performed Patient Re-evaluated:Patient Re-evaluated prior to induction Oxygen  Delivery Method: Circle system utilized Preoxygenation: Pre-oxygenation with 100% oxygen  Induction Type: IV induction LMA: LMA inserted LMA Size: 4.0 Number of attempts: 1 Comments: LMA insertion by Darlina Einstein, SRNA

## 2023-06-11 NOTE — H&P (Signed)
 Chief Complaint: Breast Cancer       History of Present Illness: [name] is a 68 y.o. female who is seen today as an office consultation at the request of Dr. Arno Bibles for evaluation of Breast Cancer .       This is a 68 year old female with DM2, COPD, HTN, asthma who presented after a recent screening mammogram revealed a 7 x 8 x 6 mm mass at 6:00 in the left breast 3 cmfn.  Axillary ultrasound revealed a mildly thickened lymph node.  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  Biopsy of the axillary lymph node was negative.  No family history of breast cancer.  No previous breast problems or breast surgery.   Medical History:     Past Medical History:  Diagnosis Date   Anxiety     Asthma, unspecified asthma severity, unspecified whether complicated, unspecified whether persistent (HHS-HCC)     COPD (chronic obstructive pulmonary disease) (CMS/HHS-HCC)     Diabetes mellitus without complication (CMS/HHS-HCC)     GERD (gastroesophageal reflux disease)     History of cancer     Hyperlipidemia     Hypertension           Patient Active Problem List  Diagnosis   DM type 2 (diabetes mellitus, type 2) (CMS/HHS-HCC)   Invasive ductal carcinoma of breast, left (CMS/HHS-HCC)   Malignant neoplasm of lower-outer quadrant of left breast of female, estrogen receptor positive (CMS/HHS-HCC)   Mixed hyperlipidemia           Past Surgical History:  Procedure Laterality Date   Tee without cardioversion N/A 08/24/2014   .Left Breast Biopsy Left 03/11/2023   HYSTERECTOMY        Total abdominal hysterectomy 2007           Allergies  Allergen Reactions   Flonase [Fluticasone ] Other (See Comments)      Nose bleed           Prior to Admission medications   Medication Sig Taking? Last Dose  amLODIPine  (NORVASC ) 10 MG tablet Take 1 tablet by mouth once daily Yes    atorvastatin  (LIPITOR) 80 MG tablet Take 80 mg by mouth once daily Yes     carvediloL  (COREG ) 12.5 MG tablet Take 12.5 mg by mouth 2 (two) times daily with meals Yes    esomeprazole  (NEXIUM ) 40 MG DR capsule Take 40 mg by mouth once daily Yes    FLUoxetine  (PROZAC ) 40 MG capsule Take 40 mg by mouth once daily Yes    linaCLOtide  (LINZESS ) 145 mcg capsule Take 145 mcg by mouth every morning before breakfast Yes    lisinopriL -hydroCHLOROthiazide  (ZESTORETIC ) 20-12.5 mg tablet Take 1 tablet by mouth once daily Yes    metFORMIN  (GLUCOPHAGE -XR) 500 MG XR tablet Take 1 tablet by mouth 2 (two) times daily with meals Yes    montelukast  (SINGULAIR ) 10 mg tablet Take 10 mg by mouth at bedtime Yes    nitroGLYcerin  (NITROSTAT ) 0.3 MG SL tablet Place 0.3 mg under the tongue every 5 (five) minutes as needed for Chest pain DISSOLVE 1 TABLET UNDER THE TONGUE EVERY 5 MINUTES AS NEEDED FOR CHEST PAIN. MAX OF 3 TABLETS IN 15 MINUTES. CALL 911 IF PAIN PERSISTS. Yes    semaglutide  (OZEMPIC ) 1 mg/dose (4 mg/3 mL) pen injector Inject 1 mg as directed once a week Yes    traZODone  (DESYREL ) 50 MG tablet Take 50 mg by mouth at bedtime  as needed for Sleep Yes    TRELEGY ELLIPTA  100-62.5-25 mcg inhaler Take 1 Puff by mouth once daily Yes    aspirin  81 MG EC tablet Take 81 mg by mouth once daily                   Family History  Problem Relation Age of Onset   Diabetes Mother     Deep vein thrombosis (DVT or abnormal blood clot formation) Sister     Stroke Daughter     High blood pressure (Hypertension) Daughter     Hyperlipidemia (Elevated cholesterol) Daughter     Diabetes Daughter     Deep vein thrombosis (DVT or abnormal blood clot formation) Daughter        Social History        Tobacco Use  Smoking Status Former   Types: Cigarettes  Smokeless Tobacco Never  Tobacco Comments    Quit cigarettes 2010      Social History         Socioeconomic History   Marital status: Single  Tobacco Use   Smoking status: Former      Types: Cigarettes   Smokeless tobacco: Never    Tobacco comments:      Quit cigarettes 2010  Vaping Use   Vaping status: Never Used  Substance and Sexual Activity   Alcohol use: Not Currently   Drug use: Never    Social Drivers of Acupuncturist Strain: Low Risk  (12/31/2022)    Received from [facility]    Overall Financial Resource Strain (CARDIA)     Difficulty of Paying Living Expenses: Not very hard  Food Insecurity: No Food Insecurity (12/31/2022)    Received from [facility]    Hunger Vital Sign     Worried About Running Out of Food in the Last Year: Never true     Ran Out of Food in the Last Year: Never true  Transportation Needs: No Transportation Needs (12/31/2022)    Received from [facility] - Transportation     Lack of Transportation (Medical): No     Lack of Transportation (Non-Medical): No  Physical Activity: Inactive (12/31/2022)    Received from [facility]    Exercise Vital Sign     Days of Exercise per Week: 0 days     Minutes of Exercise per Session: 0 min  Stress: No Stress Concern Present (12/31/2022)    Received from [facility] - Occupational Stress Questionnaire     Feeling of Stress : Not at all  Social Connections: Socially Isolated (12/31/2022)    Received from [facility]    Social Connection and Isolation Panel [NHANES]     Frequency of Communication with Friends and Family: Once a week     Frequency of Social Gatherings with Friends and Family: Never     Attends Religious Services: Never     Database administrator or Organizations: No     Attends Banker Meetings: Never     Marital Status: Divorced  Housing Stability: Low Risk  (12/31/2022)    Received from [facility]    Housing Stability Vital Sign     Unable to Pay for Housing in the Last Year: No     Number of Times Moved in the Last Year: 0     Homeless in the Last Year: No  Objective:      Physical Exam    Constitutional:  WDWN in  NAD, conversant, no obvious deformities; seated in wheelchair Eyes:  Pupils equal, round; sclera anicteric; moist conjunctiva; no lid lag HENT:  Oral mucosa moist; good dentition  Neck:  No masses palpated, trachea midline; no thyromegaly Lungs:  CTA bilaterally; normal respiratory effort Breasts:  symmetric, no nipple changes; no palpable masses or lymphadenopathy on either side; slight bruising in central/ upper left breast after biopsy CV:  Regular rate and rhythm; no murmurs; extremities well-perfused with no edema Abd:  +bowel sounds, soft, non-tender, no palpable organomegaly; no palpable hernias Musc:  Unable to assess gait; no apparent clubbing or cyanosis in extremities Lymphatic:  No palpable cervical or axillary lymphadenopathy Skin:  Warm, dry; no sign of jaundice Psychiatric - alert and oriented x 4; calm mood and affect     Labs, Imaging and Diagnostic Testing:   FINAL DIAGNOSIS       1. Breast, left, needle core biopsy, left breast mass 6:00, 3cmfn (ribbon clip) :      INVASIVE DUCTAL CARCINOMA, SEE NOTE      DUCTAL CARCINOMA IN SITU, CRIBRIFORM, LOW NUCLEAR GRADE, WITHOUT NECROSIS      TUBULE FORMATION: SCORE 2      NUCLEAR PLEOMORPHISM: SCORE 2      MITOTIC COUNT: SCORE 1      TOTAL SCORE: 5      OVERALL GRADE: 1      LYMPHOVASCULAR INVASION: NOT IDENTIFIED      CANCER LENGTH: 1 CM      CALCIFICATIONS: NOT IDENTIFIED      OTHER FINDINGS: NONE      SEE NOTE       2. Breast, left, needle core biopsy, left axillary lymph node (hydromark clip) :      REACTIVE LYMPH NODE NEGATIVE FOR METASTATIC CARCINOMA       Diagnosis Note : Dr.Lu reviewed the case and concurs with the interpretation.A      breast prognostic profile (ER, PR, Ki-67 and HER2) is pending and will be      reported in an addendum.[facility] was notified on 03/12/2023.      ELECTRONIC SIGNATURE : Earleen Glazier, John, Pathologist, Electronic Signature   ADDENDUM Breast, left, needle  core biopsy, left breast mass 6:00, 3cmfn (ribbon clip) PROGNOSTIC INDICATORS  Results: IMMUNOHISTOCHEMICAL AND MORPHOMETRIC ANALYSIS PERFORMED MANUALLY The tumor cells are NEGATIVE for Her2 (0-1+). Estrogen Receptor:  100%, POSITIVE, STRONG STAINING INTENSITY Progesterone Receptor:  95%, POSITIVE, STRONG STAINING INTENSITY Proliferation Marker Ki67:  2% REFERENCE RANGE ESTROGEN RECEPTOR NEGATIVE     0% POSITIVE       =>1% REFERENCE RANGE PROGESTERONE RECEPTOR NEGATIVE     0% POSITIVE        =>1% All controls stained appropriately [place]lesimer Md, Fred , Sports administrator, Electronic Signature ( Signed [phone] 26 2025)    CLINICAL DATA:  Patient was recalled from screening mammogram for possible distortion.   EXAM: DIGITAL DIAGNOSTIC UNILATERAL LEFT MAMMOGRAM WITH TOMOSYNTHESIS AND CAD; ULTRASOUND LEFT BREAST LIMITED   TECHNIQUE: Left digital diagnostic mammography and breast tomosynthesis was performed. The images were evaluated with computer-aided detection. ; Targeted ultrasound examination of the left breast was performed.   COMPARISON:  Previous exam(s).   ACR Breast Density Category b: There are scattered areas of fibroglandular density.   FINDINGS: Additional imaging of the left breast was performed. There is persistence of a spiculated mass in the 6 o'clock region of the  breast. No additional abnormality is seen in the left breast.   Targeted ultrasound is performed, showing an irregular hypoechoic mass with angular margins in the 6 o'clock region of the left breast 3 cm from the nipple measuring 7 x 8 x 6 mm. Sonographic evaluation of the left axilla shows a lymph node with cortical thickening measuring 3 mm.   IMPRESSION: Suspicious 7 mm mass in the 6 o'clock region of the left breast 3 cm from the nipple.   Indeterminate left axillary lymph node with focal cortical thickening.   RECOMMENDATION: Ultrasound-guided core biopsy of the mass in the 6 o'clock region  of the left breast as well as the indeterminate axillary lymph node is recommended.   I have discussed the findings and recommendations with the patient. If applicable, a reminder letter will be sent to the patient regarding the next appointment.   BI-RADS CATEGORY  5: Highly suggestive of malignancy.     Electronically Signed   By: Dina  Arceo M.D.   On: 03/01/2023 16:37   Assessment and Plan:  Diagnoses and all orders for this visit:   Malignant neoplasm of lower-outer quadrant of left breast of female, estrogen receptor positive (CMS/HHS-HCC)     After a thorough multidisciplinary discussion, and after a lengthy discussion with the patient, we presented her treatment options.  We discussed mastectomy vs. Breast conserving therapy.  She has multiple medical comorbidities and limited mobility.  After discussion, she is opting for breast conserving therapy.  We will proceed with left breast radioactive seed localized lumpectomy.  Based on current studies, there is no indication for sentinel lymph node biopsy.  Surgery will be followed by possible radiation and anti-estrogens.   Kari Otto. Eli Grizzle, MD, [facility]  General Surgery   06/11/2023 9:48 AM

## 2023-06-12 ENCOUNTER — Encounter (HOSPITAL_COMMUNITY): Payer: Self-pay | Admitting: Surgery

## 2023-06-13 ENCOUNTER — Ambulatory Visit: Payer: Self-pay | Admitting: Surgery

## 2023-06-13 DIAGNOSIS — C50912 Malignant neoplasm of unspecified site of left female breast: Secondary | ICD-10-CM

## 2023-06-13 LAB — SURGICAL PATHOLOGY

## 2023-06-14 ENCOUNTER — Telehealth: Payer: Self-pay | Admitting: Radiation Oncology

## 2023-06-14 ENCOUNTER — Other Ambulatory Visit: Payer: Self-pay | Admitting: *Deleted

## 2023-06-14 DIAGNOSIS — Z170 Estrogen receptor positive status [ER+]: Secondary | ICD-10-CM

## 2023-06-14 NOTE — Telephone Encounter (Signed)
 Sent [facility] Breath hold document via mychart.

## 2023-06-16 DIAGNOSIS — J449 Chronic obstructive pulmonary disease, unspecified: Secondary | ICD-10-CM | POA: Diagnosis not present

## 2023-06-16 DIAGNOSIS — J440 Chronic obstructive pulmonary disease with acute lower respiratory infection: Secondary | ICD-10-CM | POA: Diagnosis not present

## 2023-06-16 DIAGNOSIS — J418 Mixed simple and mucopurulent chronic bronchitis: Secondary | ICD-10-CM | POA: Diagnosis not present

## 2023-06-17 ENCOUNTER — Encounter: Payer: Self-pay | Admitting: *Deleted

## 2023-06-26 ENCOUNTER — Other Ambulatory Visit: Payer: Self-pay | Admitting: Internal Medicine

## 2023-06-26 DIAGNOSIS — E1165 Type 2 diabetes mellitus with hyperglycemia: Secondary | ICD-10-CM

## 2023-07-02 NOTE — Progress Notes (Incomplete)
 Location of Breast Cancer: Left Breast  Histology per Pathology Report: 06/11/2023 FINAL MICROSCOPIC DIAGNOSIS:   A. 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   Past/Anticipated interventions by surgeon, if any: Dr. Dareen Ebbing: Left BREAST LUMPECTOMY WITH RADIOACTIVE SEED LOCALIZATION 06/11/2023  Past/Anticipated interventions by medical oncology, if any: Chemotherapy- No  Lymphedema issues, if any:  ***  Pain issues, if any:  ***  Skin issues if any ***  SAFETY ISSUES: Prior radiation? No Pacemaker/ICD? No Possible current pregnancy? No. Pt has had a Hysterectomy. Is the patient on methotrexate? No  Current Complaints / other details: ***  Vitals: ***    This concludes the interaction.  Avery Bodo, LPN

## 2023-07-04 NOTE — Progress Notes (Incomplete)
 Radiation Oncology         [phone] ________________________________  Name: [name]        MRN: [id number]  Date of Service: 07/09/2023 DOB: [birth year]  WU:[id number], Rexine Cater, MD  Murleen Arms, MD     REFERRING PHYSICIAN: Murleen Arms, MD   DIAGNOSIS: The encounter diagnosis was Malignant neoplasm of lower-outer quadrant of left breast of female, estrogen receptor positive (HCC).    HISTORY OF PRESENT ILLNESS: [name] is a 68 y.o. female with a diagnosis of  left breast cancer. She presented with an asymmetry detected on screening mammogram. She proceeded with diagnostic mammogram and ultrasound on 03/01/2023 that showed a suspicious 0.7 cm mass in the 6:00 position 3 cmfn. An indeterminate left axillary lymph node was also noted. 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 A biopsy of the abnormal left axillary lymph node showed no evidence for metastatic carcinoma and was concordant.  Since her last visit, he patient underwent a left breast lumpectomy on 06/11/2023 which showed a grade 1 invasive ductal carcinoma measuring 1.5 cm with associated intermediate grade DCIS, her margins were negative, the closest being 2 mm to the inferior margin for invasive disease and 5 mm to the inferior margin for in situ disease.  No lymph nodes were submitted.  She is not having any additional Oncotype studies based on her comorbidities since Dr. Arno Bibles does not recommend chemotherapy.  She is seen today to discuss radiotherapy.   PREVIOUS RADIATION THERAPY: No   PAST MEDICAL HISTORY:  Past Medical History:  Diagnosis Date   Anxiety    Arthritis    ASD (atrial septal defect)    TEE 08/19/14: Very small insignificant fenestrated atrial septal defect (2 defects) with very mildly positive double contrast study for right to left shunting   Asthma    Breast cancer (HCC)    left    Chest pain 2016   Non-ischemic stress test 07/05/14   COPD (chronic obstructive pulmonary disease) (HCC)    Depression    DM type 2 (diabetes mellitus, type 2) (HCC)    GERD (gastroesophageal reflux disease)    History of home oxygen  therapy    Hypertension    Pneumonia    Shortness of breath dyspnea    Sleep apnea    does not use CPAP       PAST SURGICAL HISTORY: Past Surgical History:  Procedure Laterality Date   BREAST BIOPSY Left 03/11/2023   US  LT BREAST BX W LOC DEV 1ST LESION IMG BX SPEC US  GUIDE 03/11/2023 [facility]   BREAST BIOPSY Left 06/07/2023   US  LT RADIOACTIVE SEED LOC 06/07/2023 [facility]   BREAST LUMPECTOMY WITH RADIOACTIVE SEED LOCALIZATION Left 06/11/2023   Procedure: BREAST LUMPECTOMY WITH RADIOACTIVE SEED LOCALIZATION;  Surgeon: Dareen Ebbing, MD;  Location: [facility] OR;  Service: General;  Laterality: Left;  LEFT BREAST RADIOACTIVE SEED LOCALIZED LUMPECTOMY   TEE WITHOUT CARDIOVERSION N/A 08/24/2014   Procedure: TRANSESOPHAGEAL ECHOCARDIOGRAM (TEE);  Surgeon: Knox Perl, MD;  Location: [facility];  Service: Cardiovascular;  Laterality: N/A;   TOTAL ABDOMINAL HYSTERECTOMY  01/15/2005     FAMILY HISTORY:  Family History  Problem Relation Age of Onset   Allergies Mother    Allergies Daughter    Asthma Son        had as a child   BRCA 1/2 Neg Hx  Breast cancer Neg Hx      SOCIAL HISTORY:  reports that she quit smoking about 15 years ago. Her smoking use included cigarettes. She started smoking about 53 years ago. She has a 38 pack-year smoking history. She has never used smokeless tobacco. She reports that she does not drink alcohol and does not use drugs.  The patient is single and lives in [place].***   ALLERGIES: Flonase [fluticasone  propionate]   MEDICATIONS:  Current Outpatient Medications  Medication Sig Dispense Refill   Accu-Chek Softclix Lancets lancets Use as instructed to check blood sugars.  E11.69 100 each 12    albuterol  (PROAIR  HFA) 108 (90 Base) MCG/ACT inhaler Inhale 1-2 puffs into the lungs every 6 (six) hours as needed for wheezing or shortness of breath. 18 g 2   albuterol  (PROVENTIL ) (2.5 MG/3ML) 0.083% nebulizer solution Take 3 mLs (2.5 mg total) by nebulization every 6 (six) hours as needed for wheezing or shortness of breath. 150 mL 1   amLODipine  (NORVASC ) 10 MG tablet Take 1 tablet (10 mg total) by mouth daily. 100 tablet 1   aspirin  81 MG tablet Take 81 mg by mouth daily.     atorvastatin  (LIPITOR) 80 MG tablet Take 1 tablet (80 mg total) by mouth daily. 90 tablet 3   Blood Glucose Monitoring Suppl (ACCU-CHEK GUIDE) w/Device KIT UAD to check blood sugars daily. E11.69 1 kit 0   carvedilol  (COREG ) 12.5 MG tablet Take 1 tablet (12.5 mg total) by mouth 2 (two) times daily with a meal. 180 tablet 3   Continuous Glucose Receiver (FREESTYLE LIBRE 2 READER) DEVI Use to check glucose continuously throughout the day. E11.69 1 each 0   Continuous Glucose Sensor (FREESTYLE LIBRE 2 SENSOR) MISC Place 1 sensor on the skin every 14 days. Use to check glucose continuously. E11.69 2 each 6   esomeprazole  (NEXIUM ) 40 MG capsule Take 1 capsule (40 mg total) by mouth daily. 90 capsule 3   FLUoxetine  (PROZAC ) 40 MG capsule TAKE 1 CAPSULE BY MOUTH DAILY 100 capsule 1   Fluticasone -Umeclidin-Vilant (TRELEGY ELLIPTA ) 100-62.5-25 MCG/ACT AEPB Take 1 puff by mouth daily. 180 each 1   glucose blood (ACCU-CHEK GUIDE TEST) test strip Use as instructed to check blood sugars twice a day E11.69 100 each 12   linaclotide  (LINZESS ) 145 MCG CAPS capsule Take 1 capsule (145 mcg total) by mouth daily before breakfast. 100 capsule 1   lisinopril -hydrochlorothiazide  (ZESTORETIC ) 20-12.5 MG tablet Take 0.5 tablets by mouth daily. (Patient taking differently: Take 1 tablet by mouth daily.) 50 tablet 2   metFORMIN  (GLUCOPHAGE -XR) 500 MG 24 hr tablet TAKE 1 TABLET BY MOUTH TWICE  DAILY WITH MEALS 180 tablet 3   montelukast  (SINGULAIR )  10 MG tablet Take 1 tablet (10 mg total) by mouth at bedtime. 90 tablet 3   nitroGLYCERIN  (NITROSTAT ) 0.3 MG SL tablet DISSOLVE 1 TABLET UNDER THE  TONGUE EVERY 5 MINUTES AS NEEDED FOR CHEST PAIN. MAX OF 3 TABLETS IN 15 MINUTES. CALL 911 IF PAIN  PERSISTS. 100 tablet 3   [Paused] Semaglutide , 1 MG/DOSE, 4 MG/3ML SOPN Inject 1 mg as directed once a week. (Patient not taking: Reported on 06/04/2023) 9 mL 1   tiZANidine  (ZANAFLEX ) 2 MG tablet Take 2 mg by mouth every 6 (six) hours as needed for muscle spasms.     traZODone  (DESYREL ) 50 MG tablet Take 1 tablet (50 mg total) by mouth at bedtime as needed. (Patient taking differently: Take 50 mg by mouth at bedtime as needed for  sleep.) 90 tablet 3   No current facility-administered medications for this encounter.   Facility-Administered Medications Ordered in Other Encounters  Medication Dose Route Frequency Provider Last Rate Last Admin   Chlorhexidine  Gluconate Cloth 2 % PADS 6 each  6 each Topical Once Tsuei, Matthew, MD       And   Chlorhexidine  Gluconate Cloth 2 % PADS 6 each  6 each Topical Once Tsuei, Matthew, MD         REVIEW OF SYSTEMS: On review of systems, the patient reports that she is doing ***     PHYSICAL EXAM:  Wt Readings from Last 3 Encounters:  06/11/23 183 lb (83 kg)  06/05/23 183 lb 9.6 oz (83.3 kg)  05/01/23 187 lb 6.4 oz (85 kg)   Temp Readings from Last 3 Encounters:  06/11/23 98 F (36.7 C)  06/05/23 98.2 F (36.8 C)  05/01/23 97.8 F (36.6 C) (Oral)   BP Readings from Last 3 Encounters:  06/11/23 132/70  06/05/23 110/68  05/01/23 106/68   Pulse Readings from Last 3 Encounters:  06/11/23 62  06/05/23 63  05/01/23 70    /10  In general this is a well appearing female in no acute distress. She's alert and oriented x4 and appropriate throughout the examination. Cardiopulmonary assessment is negative for acute distress and she exhibits normal effort. Her left breast reveals a well-healed surgical  incision site without erythema, separation or drainage.***    ECOG = 0  0 - Asymptomatic (Fully active, able to carry on all predisease activities without restriction)  1 - Symptomatic but completely ambulatory (Restricted in physically strenuous activity but ambulatory and able to carry out work of a light or sedentary nature. For example, light housework, office work)  2 - Symptomatic, <50% in bed during the day (Ambulatory and capable of all self care but unable to carry out any work activities. Up and about more than 50% of waking hours)  3 - Symptomatic, >50% in bed, but not bedbound (Capable of only limited self-care, confined to bed or chair 50% or more of waking hours)  4 - Bedbound (Completely disabled. Cannot carry on any self-care. Totally confined to bed or chair)  5 - Death   Aurea Blossom MM, Creech RH, Tormey DC, et al. [phone]). Toxicity and response criteria of the [facility]. Am. Hillard Lowes. Oncol. 5 (6): 649-55    LABORATORY DATA:  Lab Results  Component Value Date   WBC 5.1 06/05/2023   HGB 13.7 06/05/2023   HCT 41.7 06/05/2023   MCV 89.5 06/05/2023   PLT 194 06/05/2023   Lab Results  Component Value Date   NA 135 06/05/2023   K 3.5 06/05/2023   CL 103 06/05/2023   CO2 22 06/05/2023   Lab Results  Component Value Date   ALT 25 04/02/2023   AST 23 04/02/2023   ALKPHOS 38 04/02/2023   BILITOT 0.6 04/02/2023      RADIOGRAPHY: MM Breast Surgical Specimen Result Date: 06/11/2023 CLINICAL DATA:  Post lumpectomy specimen radiograph EXAM: SPECIMEN RADIOGRAPH OF THE LEFT BREAST COMPARISON:  Previous exam(s). FINDINGS: Status post excision of the left breast. The radioactive seed and biopsy marker clip are present and completely intact. IMPRESSION: Specimen radiograph of the left breast. Electronically Signed   By: Allena Ito M.D.   On: 06/11/2023 12:02   MM CLIP PLACEMENT LEFT Result Date: 06/07/2023 CLINICAL DATA:  Status post  radioactive seed localization of the left breast carcinoma. Assess seat position.  EXAM: DIAGNOSTIC LEFT MAMMOGRAM POST ULTRASOUND-GUIDED RADIOACTIVE SEED PLACEMENT COMPARISON:  Previous exam(s). ACR Breast Density Category b: There are scattered areas of fibroglandular density. FINDINGS: Mammographic images were obtained following ultrasound-guided radioactive seed placement. These demonstrate reactive seed to lie within the mass directly adjacent to the ribbon shaped post biopsy clip breast. IMPRESSION: Appropriate location of the radioactive seed. Final Assessment: Post Procedure Mammograms for Seed Placement BI-RADS CATEGORY  18M: Post-Procedure Mammogram for Marker Placement Electronically Signed   By: Amanda Jungling M.D.   On: 06/07/2023 10:44   US  LT RADIOACTIVE SEED LOC Result Date: 06/07/2023 CLINICAL DATA:  Patient presents for radioactive seed localization of a left breast carcinoma prior to surgical excision. EXAM: ULTRASOUND GUIDED RADIOACTIVE SEED LOCALIZATION OF THE LEFT BREAST COMPARISON:  Previous exam(s). FINDINGS: Patient presents for radioactive seed localization prior to surgical excision. I met with the patient and we discussed the procedure of seed localization including benefits and alternatives. We discussed the high likelihood of a successful procedure. We discussed the risks of the procedure including infection, bleeding, tissue injury and further surgery. We discussed the low dose of radioactivity involved in the procedure. Informed, written consent was given. The usual time-out protocol was performed immediately prior to the procedure. Using ultrasound guidance, sterile technique, 1% lidocaine  and an I-125 radioactive seed, the mass and associated post biopsy clip in the left breast at 6 o'clock were localized using an inferior approach. The follow-up mammogram images confirm the seed in the expected location and were marked for Dr. Eli Grizzle. Follow-up survey of the patient confirms  presence of the radioactive seed. Order number of I-125 seed:  [id number]. Total activity:  0.239 millicuries.  Reference Date: 05/01/2023. The patient tolerated the procedure well and [facility]. She was given instructions regarding seed removal. IMPRESSION: Radioactive seed localization of the left breast. No apparent complications. Electronically Signed   By: Amanda Jungling M.D.   On: 06/07/2023 10:33      IMPRESSION/PLAN: 1.  Stage IA T1,cN18M0, grade 1, ER/PR positive invasive ductal carcinoma of the left breast. Dr. Jeryl Moris has reviewed the final pathology findings and today I reviewed the nature of early stage breast disease.  She has done well since surgery, and based on her performance status, Dr. Loyce Ruffini does not recommend chemotherapy or Oncotype testing.  Dr. Jeryl Moris recommends external beam radiotherapy to the breast to reduce risks of local recurrence. Dr. Arno Bibles also plans antiestrogen therapy.  We discussed the risks, benefits, short, and long term effects of radiotherapy, as well as the curative intent, and the patient is interested in proceeding.  I reviewed the delivery and logistics of radiotherapy and Dr. Jeryl Moris recommends 4 weeks of radiotherapy to the left breast with deep inspiration breath hold technique. Written consent is obtained and placed in the chart, a copy was provided to the patient. She will simulate today ***      In a visit lasting *** minutes, greater than 50% of the time was spent face to face reviewing her case, as well as in preparation of, discussing, and coordinating the patient's care.     Shelvia Dick, [facility]     **Disclaimer: This note was dictated with voice recognition software. Similar sounding words can inadvertently be transcribed and this note may contain transcription errors which may not have been corrected upon publication of note.**

## 2023-07-09 ENCOUNTER — Ambulatory Visit: Admission: RE | Admit: 2023-07-09 | Source: Ambulatory Visit

## 2023-07-09 ENCOUNTER — Encounter

## 2023-07-09 ENCOUNTER — Other Ambulatory Visit: Payer: Self-pay | Admitting: Physician Assistant

## 2023-07-09 ENCOUNTER — Ambulatory Visit: Attending: Radiation Oncology | Admitting: Radiation Oncology

## 2023-07-09 ENCOUNTER — Ambulatory Visit
Admission: RE | Admit: 2023-07-09 | Discharge: 2023-07-09 | Disposition: A | Discharge status: Home / Self Care | Source: Ambulatory Visit | Attending: Radiation Oncology | Admitting: Radiation Oncology

## 2023-07-09 DIAGNOSIS — K5900 Constipation, unspecified: Secondary | ICD-10-CM

## 2023-07-09 DIAGNOSIS — C50512 Malignant neoplasm of lower-outer quadrant of left female breast: Secondary | ICD-10-CM

## 2023-07-09 DIAGNOSIS — Z170 Estrogen receptor positive status [ER+]: Secondary | ICD-10-CM

## 2023-07-09 NOTE — Progress Notes (Signed)
 Location of Breast Cancer: Left Breast  Histology per Pathology Report: 06/11/2023 FINAL MICROSCOPIC DIAGNOSIS:   A. 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   Past/Anticipated interventions by surgeon, if any: Dr. Donnice Lima: Left BREAST LUMPECTOMY WITH RADIOACTIVE SEED LOCALIZATION 06/11/2023  Past/Anticipated interventions by medical oncology, if any: Chemotherapy- No  Lymphedema issues, if any:  Mild  Pain issues, if any: Tenderness 5/10 LT breast  Skin issues if any Dryness  SAFETY ISSUES: Prior radiation? No Pacemaker/ICD? No Possible current pregnancy? No. Pt has had a Hysterectomy. Is the patient on methotrexate? No  Current Complaints / other details: ROM mildly limited bilaterally (uses cane)  Vitals: BP 127/76 (BP Location: Left Arm, Patient Position: Sitting, Cuff Size: Large)   Pulse 70   Temp (!) 97.4 F (36.3 C) (Temporal)   Resp (!) 24   Ht 5' 7 (1.702 m)   Wt 181 lb 9.6 oz (82.4 kg)   SpO2 95%   BMI 28.44 kg/m     This concludes the interaction.  Rosaline Minerva, LPN

## 2023-07-10 ENCOUNTER — Ambulatory Visit
Admission: RE | Admit: 2023-07-10 | Discharge: 2023-07-10 | Disposition: A | Discharge status: Home / Self Care | Source: Ambulatory Visit | Attending: Radiation Oncology | Admitting: Radiation Oncology

## 2023-07-10 ENCOUNTER — Encounter: Payer: Self-pay | Admitting: Radiation Oncology

## 2023-07-10 VITALS — BP 127/76 | HR 70 | Temp 97.4°F | Resp 24 | Ht 67.0 in | Wt 181.6 lb

## 2023-07-10 DIAGNOSIS — C50512 Malignant neoplasm of lower-outer quadrant of left female breast: Secondary | ICD-10-CM | POA: Diagnosis not present

## 2023-07-10 DIAGNOSIS — G4730 Sleep apnea, unspecified: Secondary | ICD-10-CM | POA: Diagnosis not present

## 2023-07-10 DIAGNOSIS — Z79899 Other long term (current) drug therapy: Secondary | ICD-10-CM | POA: Insufficient documentation

## 2023-07-10 DIAGNOSIS — Z7985 Long-term (current) use of injectable non-insulin antidiabetic drugs: Secondary | ICD-10-CM | POA: Insufficient documentation

## 2023-07-10 DIAGNOSIS — E119 Type 2 diabetes mellitus without complications: Secondary | ICD-10-CM | POA: Insufficient documentation

## 2023-07-10 DIAGNOSIS — J449 Chronic obstructive pulmonary disease, unspecified: Secondary | ICD-10-CM | POA: Insufficient documentation

## 2023-07-10 DIAGNOSIS — Z170 Estrogen receptor positive status [ER+]: Secondary | ICD-10-CM

## 2023-07-10 DIAGNOSIS — Z7984 Long term (current) use of oral hypoglycemic drugs: Secondary | ICD-10-CM | POA: Insufficient documentation

## 2023-07-10 DIAGNOSIS — Z7982 Long term (current) use of aspirin: Secondary | ICD-10-CM | POA: Insufficient documentation

## 2023-07-10 DIAGNOSIS — M129 Arthropathy, unspecified: Secondary | ICD-10-CM | POA: Diagnosis not present

## 2023-07-10 DIAGNOSIS — Z87891 Personal history of nicotine dependence: Secondary | ICD-10-CM | POA: Insufficient documentation

## 2023-07-10 DIAGNOSIS — K219 Gastro-esophageal reflux disease without esophagitis: Secondary | ICD-10-CM | POA: Insufficient documentation

## 2023-07-10 DIAGNOSIS — I10 Essential (primary) hypertension: Secondary | ICD-10-CM | POA: Insufficient documentation

## 2023-07-10 NOTE — Progress Notes (Signed)
 Radiation Oncology         [phone] ________________________________  Name: [name]        MRN: [id number]  Date of Service: 07/10/2023 DOB: 03-Sep-1955  RR:Gnywdnw, Barnie NOVAK, MD  Loretha Ash, MD     REFERRING PHYSICIAN: Loretha Ash, MD   DIAGNOSIS: The encounter diagnosis was Malignant neoplasm of lower-outer quadrant of left breast of female, estrogen receptor positive (HCC).    HISTORY OF PRESENT ILLNESS: [name] is a 68 y.o. female with a diagnosis of  left breast cancer. She presented with an asymmetry detected on screening mammogram. She proceeded with diagnostic mammogram and ultrasound on 03/01/2023 that showed a suspicious 0.7 cm mass in the 6:00 position 3 cmfn. An indeterminate left axillary lymph node was also noted. 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 A biopsy of the abnormal left axillary lymph node showed no evidence for metastatic carcinoma and was concordant.   Since her last visit, he patient underwent a left breast lumpectomy on 06/11/2023 which showed a grade 1 invasive ductal carcinoma measuring 1.5 cm with associated intermediate grade DCIS, her margins were negative, the closest being 2 mm to the inferior margin for invasive disease and 5 mm to the inferior margin for in situ disease.  No lymph nodes were submitted.  She is not having any additional Oncotype studies based on her comorbidities since Dr. Loretha does not recommend chemotherapy.  She is seen today to discuss radiotherapy.    PREVIOUS RADIATION THERAPY: No   PAST MEDICAL HISTORY:  Past Medical History:  Diagnosis Date   Anxiety    Arthritis    ASD (atrial septal defect)    TEE 08/19/14: Very small insignificant fenestrated atrial septal defect (2 defects) with very mildly positive double contrast study for right to left shunting   Asthma    Breast cancer (HCC)     left   Chest pain 2016   Non-ischemic stress test 07/05/14   COPD (chronic obstructive pulmonary disease) (HCC)    Depression    DM type 2 (diabetes mellitus, type 2) (HCC)    GERD (gastroesophageal reflux disease)    History of home oxygen  therapy    Hypertension    Pneumonia    Shortness of breath dyspnea    Sleep apnea    does not use CPAP       PAST SURGICAL HISTORY: Past Surgical History:  Procedure Laterality Date   BREAST BIOPSY Left 03/11/2023   US  LT BREAST BX W LOC DEV 1ST LESION IMG BX SPEC US  GUIDE 03/11/2023 [facility] MAMMOGRAPHY   BREAST BIOPSY Left 06/07/2023   US  LT RADIOACTIVE SEED LOC 06/07/2023 [facility]AMMOGR[facility]   BREAST LUMPECTOMY WITH RADIOACTIVE SEED LOCALIZATION Left 06/11/2023   Procedure: BREAST LUMPECTOMY WITH RADIOACTIVE SEED LOCALIZATION;  Surgeon: Belinda Cough, MD;  Location: [facility] OR;  Service: General;  Laterality: Left;  LEFT BREAST RADIOACTIVE SEED LOCALIZED LUMPECTOMY   TEE WITHOUT CARDIOVERSION N/A 08/24/2014   Procedure: TRANSESOPHAGEAL ECHOCARDIOGRAM (TEE);  Surgeon: Gordy Bergamo, MD;  Location: [facility] ENDOSCOPY;  Service: Cardiovascular;  Laterality: N/A;   TOTAL ABDOMINAL HYSTERECTOMY  01/15/2005     FAMILY HISTORY:  Family History  Problem Relation Age of Onset   Allergies Mother    Allergies Daughter    Asthma Son        had as a child   BRCA 1/2  Neg Hx    Breast cancer Neg Hx      SOCIAL HISTORY:  reports that she quit smoking about 15 years ago. Her smoking use included cigarettes. She started smoking about 53 years ago. She has a 38 pack-year smoking history. She has never used smokeless tobacco. She reports that she does not drink alcohol and does not use drugs. The patient is single and lives in [place].    ALLERGIES: Flonase [fluticasone  propionate]   MEDICATIONS:  Current Outpatient Medications  Medication Sig Dispense Refill   Accu-Chek Softclix Lancets lancets Use as instructed to check blood sugars.  E11.69 100 each 12    albuterol  (PROAIR  HFA) 108 (90 Base) MCG/ACT inhaler Inhale 1-2 puffs into the lungs every 6 (six) hours as needed for wheezing or shortness of breath. 18 g 2   albuterol  (PROVENTIL ) (2.5 MG/3ML) 0.083% nebulizer solution Take 3 mLs (2.5 mg total) by nebulization every 6 (six) hours as needed for wheezing or shortness of breath. 150 mL 1   amLODipine  (NORVASC ) 10 MG tablet Take 1 tablet (10 mg total) by mouth daily. 100 tablet 1   aspirin  81 MG tablet Take 81 mg by mouth daily.     atorvastatin  (LIPITOR) 80 MG tablet Take 1 tablet (80 mg total) by mouth daily. 90 tablet 3   Blood Glucose Monitoring Suppl (ACCU-CHEK GUIDE) w/Device KIT UAD to check blood sugars daily. E11.69 1 kit 0   carvedilol  (COREG ) 12.5 MG tablet Take 1 tablet (12.5 mg total) by mouth 2 (two) times daily with a meal. 180 tablet 3   Continuous Glucose Receiver (FREESTYLE LIBRE 2 READER) DEVI Use to check glucose continuously throughout the day. E11.69 1 each 0   Continuous Glucose Sensor (FREESTYLE LIBRE 2 SENSOR) MISC Place 1 sensor on the skin every 14 days. Use to check glucose continuously. E11.69 2 each 6   esomeprazole  (NEXIUM ) 40 MG capsule Take 1 capsule (40 mg total) by mouth daily. 90 capsule 3   FLUoxetine  (PROZAC ) 40 MG capsule TAKE 1 CAPSULE BY MOUTH DAILY 100 capsule 1   Fluticasone -Umeclidin-Vilant (TRELEGY ELLIPTA ) 100-62.5-25 MCG/ACT AEPB Take 1 puff by mouth daily. 180 each 1   glucose blood (ACCU-CHEK GUIDE TEST) test strip Use as instructed to check blood sugars twice a day E11.69 100 each 12   linaclotide  (LINZESS ) 145 MCG CAPS capsule TAKE 1 CAPSULE BY MOUTH DAILY  BEFORE BREAKFAST 90 capsule 0   lisinopril -hydrochlorothiazide  (ZESTORETIC ) 20-12.5 MG tablet Take 0.5 tablets by mouth daily. (Patient taking differently: Take 1 tablet by mouth daily.) 50 tablet 2   metFORMIN  (GLUCOPHAGE -XR) 500 MG 24 hr tablet TAKE 1 TABLET BY MOUTH TWICE  DAILY WITH MEALS 180 tablet 3   montelukast  (SINGULAIR ) 10 MG tablet Take  1 tablet (10 mg total) by mouth at bedtime. 90 tablet 3   nitroGLYCERIN  (NITROSTAT ) 0.3 MG SL tablet DISSOLVE 1 TABLET UNDER THE  TONGUE EVERY 5 MINUTES AS NEEDED FOR CHEST PAIN. MAX OF 3 TABLETS IN 15 MINUTES. CALL 911 IF PAIN  PERSISTS. 100 tablet 3   [Paused] Semaglutide , 1 MG/DOSE, 4 MG/3ML SOPN Inject 1 mg as directed once a week. (Patient not taking: Reported on 06/04/2023) 9 mL 1   tiZANidine  (ZANAFLEX ) 2 MG tablet Take 2 mg by mouth every 6 (six) hours as needed for muscle spasms.     traZODone  (DESYREL ) 50 MG tablet Take 1 tablet (50 mg total) by mouth at bedtime as needed. (Patient taking differently: Take 50 mg by mouth at bedtime  as needed for sleep.) 90 tablet 3   No current facility-administered medications for this encounter.   Facility-Administered Medications Ordered in Other Encounters  Medication Dose Route Frequency Provider Last Rate Last Admin   Chlorhexidine  Gluconate Cloth 2 % PADS 6 each  6 each Topical Once Tsuei, Matthew, MD       And   Chlorhexidine  Gluconate Cloth 2 % PADS 6 each  6 each Topical Once Tsuei, Matthew, MD         REVIEW OF SYSTEMS: On review of systems, the patient is doing well since surgery. She acknowledges some discomfort in her breast since surgery and tightness in her left shoulder that limits her range of motion. No other complaints are verbalized.      PHYSICAL EXAM:  Wt Readings from Last 3 Encounters:  07/10/23 181 lb 9.6 oz (82.4 kg)  06/11/23 183 lb (83 kg)  06/05/23 183 lb 9.6 oz (83.3 kg)   Temp Readings from Last 3 Encounters:  07/10/23 (!) 97.4 F (36.3 C) (Temporal)  06/11/23 98 F (36.7 C)  06/05/23 98.2 F (36.8 C)   BP Readings from Last 3 Encounters:  07/10/23 127/76  06/11/23 132/70  06/05/23 110/68   Pulse Readings from Last 3 Encounters:  07/10/23 70  06/11/23 62  06/05/23 63   Pain Assessment Pain Score: 5  Pain Loc: Breast (LT)/10  In general this is a well appearing female in no acute distress. She's  alert and oriented x4 and appropriate throughout the examination. Cardiopulmonary assessment is negative for acute distress and she exhibits normal effort. The left breast reveals a well healed surgical incision site without erythema, separation, or drainage.    ECOG = 0  0 - Asymptomatic (Fully active, able to carry on all predisease activities without restriction)  1 - Symptomatic but completely ambulatory (Restricted in physically strenuous activity but ambulatory and able to carry out work of a light or sedentary nature. For example, light housework, office work)  2 - Symptomatic, <50% in bed during the day (Ambulatory and capable of all self care but unable to carry out any work activities. Up and about more than 50% of waking hours)  3 - Symptomatic, >50% in bed, but not bedbound (Capable of only limited self-care, confined to bed or chair 50% or more of waking hours)  4 - Bedbound (Completely disabled. Cannot carry on any self-care. Totally confined to bed or chair)  5 - Death   Raylene MM, Creech RH, Tormey DC, et al. [phone]). Toxicity and response criteria of the [facility]. Am. DOROTHA Bridges. Oncol. 5 (6): 649-55    LABORATORY DATA:  Lab Results  Component Value Date   WBC 5.1 06/05/2023   HGB 13.7 06/05/2023   HCT 41.7 06/05/2023   MCV 89.5 06/05/2023   PLT 194 06/05/2023   Lab Results  Component Value Date   NA 135 06/05/2023   K 3.5 06/05/2023   CL 103 06/05/2023   CO2 22 06/05/2023   Lab Results  Component Value Date   ALT 25 04/02/2023   AST 23 04/02/2023   ALKPHOS 38 04/02/2023   BILITOT 0.6 04/02/2023      RADIOGRAPHY: MM Breast Surgical Specimen Result Date: 06/11/2023 CLINICAL DATA:  Post lumpectomy specimen radiograph EXAM: SPECIMEN RADIOGRAPH OF THE LEFT BREAST COMPARISON:  Previous exam(s). FINDINGS: Status post excision of the left breast. The radioactive seed and biopsy marker clip are present and completely intact. IMPRESSION:  Specimen radiograph of the left breast.  Electronically Signed   By: Inocente Ast M.D.   On: 06/11/2023 12:02      IMPRESSION/PLAN: 1. Stage IA, pT1c, cN0M0, grade 1, ER/PR positive invasive ductal carcinoma of the left breast. Dr. Dewey has reviewed the final pathology findings and today I reviewed the nature of early stage breast disease.  She has done well since surgery, and based on her performance status, Dr. Loretha does not recommend chemotherapy or Oncotype testing.  Dr. Dewey recommends external beam radiotherapy to the breast to reduce risks of local recurrence. Dr. Loretha also plans antiestrogen therapy.  We discussed the risks, benefits, short, and long term effects of radiotherapy, as well as the curative intent, and the patient is interested in proceeding.  I reviewed the delivery and logistics of radiotherapy and Dr. Dewey recommends 4 weeks of radiotherapy to the left breast with deep inspiration breath hold technique. Written consent is obtained and placed in the chart, a copy was provided to the patient. The patient will be contacted to coordinate treatment planning by our simulation department.  2. Difficulties with transportation. We will reach out to the transportation department in our center to help coordinate rides for her for her planning and treatment appointments.   In a visit lasting 45 minutes, greater than 50% of the time was spent face to face discussing the patient's condition, in preparation for the discussion, and coordinating the patient's care.      Donald KYM Husband, [facility]   **Disclaimer: This note was dictated with voice recognition software. Similar sounding words can inadvertently be transcribed and this note may contain transcription errors which may not have been corrected upon publication of note.**

## 2023-07-11 ENCOUNTER — Telehealth: Payer: Self-pay | Admitting: Radiation Oncology

## 2023-07-11 DIAGNOSIS — Z170 Estrogen receptor positive status [ER+]: Secondary | ICD-10-CM | POA: Diagnosis not present

## 2023-07-11 DIAGNOSIS — C50512 Malignant neoplasm of lower-outer quadrant of left female breast: Secondary | ICD-10-CM | POA: Diagnosis not present

## 2023-07-11 NOTE — Telephone Encounter (Signed)
 Spoke to pt to r/s missed CT SIM. Pt agreed to 7/8 @ 10am but advised several times that she will need transportation assistance to this appt. I agreed to send message to transportation coordinator to contact pt and assist with this. Pt was agreeable to this and upcoming appt.

## 2023-07-12 ENCOUNTER — Ambulatory Visit: Payer: 59 | Admitting: Internal Medicine

## 2023-07-12 ENCOUNTER — Encounter

## 2023-07-15 DIAGNOSIS — C50912 Malignant neoplasm of unspecified site of left female breast: Secondary | ICD-10-CM | POA: Diagnosis not present

## 2023-07-16 ENCOUNTER — Encounter: Payer: Self-pay | Admitting: *Deleted

## 2023-07-23 ENCOUNTER — Ambulatory Visit
Admission: RE | Admit: 2023-07-23 | Discharge: 2023-07-23 | Disposition: A | Discharge status: Home / Self Care | Source: Ambulatory Visit | Attending: Radiation Oncology | Admitting: Radiation Oncology

## 2023-07-23 ENCOUNTER — Inpatient Hospital Stay: Attending: Radiation Oncology

## 2023-07-23 DIAGNOSIS — Z170 Estrogen receptor positive status [ER+]: Secondary | ICD-10-CM | POA: Insufficient documentation

## 2023-07-23 DIAGNOSIS — Z510 Encounter for antineoplastic radiation therapy: Secondary | ICD-10-CM | POA: Diagnosis not present

## 2023-07-23 DIAGNOSIS — C50512 Malignant neoplasm of lower-outer quadrant of left female breast: Secondary | ICD-10-CM | POA: Insufficient documentation

## 2023-07-29 ENCOUNTER — Encounter: Payer: Self-pay | Admitting: *Deleted

## 2023-07-29 DIAGNOSIS — Z170 Estrogen receptor positive status [ER+]: Secondary | ICD-10-CM

## 2023-07-31 ENCOUNTER — Inpatient Hospital Stay (HOSPITAL_COMMUNITY)
Admission: EM | Admit: 2023-07-31 | Discharge: 2023-08-05 | DRG: 565 | Disposition: A | Discharge status: Home / Self Care | Attending: Internal Medicine | Admitting: Internal Medicine

## 2023-07-31 ENCOUNTER — Other Ambulatory Visit: Payer: Self-pay

## 2023-07-31 ENCOUNTER — Emergency Department (HOSPITAL_COMMUNITY)

## 2023-07-31 DIAGNOSIS — R410 Disorientation, unspecified: Secondary | ICD-10-CM | POA: Diagnosis not present

## 2023-07-31 DIAGNOSIS — M62838 Other muscle spasm: Secondary | ICD-10-CM | POA: Diagnosis present

## 2023-07-31 DIAGNOSIS — J449 Chronic obstructive pulmonary disease, unspecified: Secondary | ICD-10-CM | POA: Diagnosis present

## 2023-07-31 DIAGNOSIS — M6282 Rhabdomyolysis: Secondary | ICD-10-CM | POA: Diagnosis present

## 2023-07-31 DIAGNOSIS — C50912 Malignant neoplasm of unspecified site of left female breast: Secondary | ICD-10-CM | POA: Diagnosis present

## 2023-07-31 DIAGNOSIS — Z043 Encounter for examination and observation following other accident: Secondary | ICD-10-CM | POA: Diagnosis not present

## 2023-07-31 DIAGNOSIS — J439 Emphysema, unspecified: Secondary | ICD-10-CM | POA: Diagnosis not present

## 2023-07-31 DIAGNOSIS — T796XXA Traumatic ischemia of muscle, initial encounter: Secondary | ICD-10-CM | POA: Diagnosis not present

## 2023-07-31 DIAGNOSIS — F32A Depression, unspecified: Secondary | ICD-10-CM | POA: Diagnosis present

## 2023-07-31 DIAGNOSIS — K219 Gastro-esophageal reflux disease without esophagitis: Secondary | ICD-10-CM | POA: Diagnosis present

## 2023-07-31 DIAGNOSIS — Z79899 Other long term (current) drug therapy: Secondary | ICD-10-CM | POA: Diagnosis not present

## 2023-07-31 DIAGNOSIS — F411 Generalized anxiety disorder: Secondary | ICD-10-CM | POA: Diagnosis present

## 2023-07-31 DIAGNOSIS — Z87891 Personal history of nicotine dependence: Secondary | ICD-10-CM | POA: Diagnosis not present

## 2023-07-31 DIAGNOSIS — E876 Hypokalemia: Secondary | ICD-10-CM | POA: Diagnosis present

## 2023-07-31 DIAGNOSIS — Z7984 Long term (current) use of oral hypoglycemic drugs: Secondary | ICD-10-CM

## 2023-07-31 DIAGNOSIS — R296 Repeated falls: Secondary | ICD-10-CM | POA: Diagnosis not present

## 2023-07-31 DIAGNOSIS — E119 Type 2 diabetes mellitus without complications: Secondary | ICD-10-CM | POA: Diagnosis not present

## 2023-07-31 DIAGNOSIS — I10 Essential (primary) hypertension: Secondary | ICD-10-CM | POA: Diagnosis not present

## 2023-07-31 DIAGNOSIS — I2510 Atherosclerotic heart disease of native coronary artery without angina pectoris: Secondary | ICD-10-CM | POA: Diagnosis not present

## 2023-07-31 DIAGNOSIS — E785 Hyperlipidemia, unspecified: Secondary | ICD-10-CM | POA: Diagnosis present

## 2023-07-31 DIAGNOSIS — M542 Cervicalgia: Secondary | ICD-10-CM | POA: Diagnosis not present

## 2023-07-31 DIAGNOSIS — E860 Dehydration: Secondary | ICD-10-CM | POA: Diagnosis not present

## 2023-07-31 DIAGNOSIS — Z825 Family history of asthma and other chronic lower respiratory diseases: Secondary | ICD-10-CM | POA: Diagnosis not present

## 2023-07-31 DIAGNOSIS — J9611 Chronic respiratory failure with hypoxia: Secondary | ICD-10-CM | POA: Diagnosis not present

## 2023-07-31 DIAGNOSIS — R7401 Elevation of levels of liver transaminase levels: Secondary | ICD-10-CM

## 2023-07-31 DIAGNOSIS — Z7982 Long term (current) use of aspirin: Secondary | ICD-10-CM | POA: Diagnosis not present

## 2023-07-31 DIAGNOSIS — G4730 Sleep apnea, unspecified: Secondary | ICD-10-CM | POA: Diagnosis present

## 2023-07-31 DIAGNOSIS — G44309 Post-traumatic headache, unspecified, not intractable: Secondary | ICD-10-CM | POA: Diagnosis not present

## 2023-07-31 DIAGNOSIS — W19XXXA Unspecified fall, initial encounter: Secondary | ICD-10-CM | POA: Diagnosis not present

## 2023-07-31 DIAGNOSIS — Y92009 Unspecified place in unspecified non-institutional (private) residence as the place of occurrence of the external cause: Principal | ICD-10-CM

## 2023-07-31 DIAGNOSIS — Z888 Allergy status to other drugs, medicaments and biological substances status: Secondary | ICD-10-CM

## 2023-07-31 DIAGNOSIS — R109 Unspecified abdominal pain: Secondary | ICD-10-CM | POA: Diagnosis not present

## 2023-07-31 DIAGNOSIS — G4733 Obstructive sleep apnea (adult) (pediatric): Secondary | ICD-10-CM | POA: Diagnosis not present

## 2023-07-31 DIAGNOSIS — N200 Calculus of kidney: Secondary | ICD-10-CM | POA: Diagnosis not present

## 2023-07-31 DIAGNOSIS — R220 Localized swelling, mass and lump, head: Secondary | ICD-10-CM | POA: Diagnosis not present

## 2023-07-31 DIAGNOSIS — R079 Chest pain, unspecified: Secondary | ICD-10-CM | POA: Diagnosis not present

## 2023-07-31 DIAGNOSIS — Z90710 Acquired absence of both cervix and uterus: Secondary | ICD-10-CM

## 2023-07-31 DIAGNOSIS — R519 Headache, unspecified: Secondary | ICD-10-CM | POA: Diagnosis not present

## 2023-07-31 DIAGNOSIS — I509 Heart failure, unspecified: Secondary | ICD-10-CM | POA: Diagnosis not present

## 2023-07-31 DIAGNOSIS — J4489 Other specified chronic obstructive pulmonary disease: Secondary | ICD-10-CM | POA: Diagnosis not present

## 2023-07-31 DIAGNOSIS — M19012 Primary osteoarthritis, left shoulder: Secondary | ICD-10-CM | POA: Diagnosis not present

## 2023-07-31 DIAGNOSIS — I6782 Cerebral ischemia: Secondary | ICD-10-CM | POA: Diagnosis not present

## 2023-07-31 DIAGNOSIS — R404 Transient alteration of awareness: Secondary | ICD-10-CM | POA: Diagnosis not present

## 2023-07-31 DIAGNOSIS — M47812 Spondylosis without myelopathy or radiculopathy, cervical region: Secondary | ICD-10-CM | POA: Diagnosis not present

## 2023-07-31 DIAGNOSIS — R6889 Other general symptoms and signs: Secondary | ICD-10-CM | POA: Diagnosis not present

## 2023-07-31 LAB — CBC WITH DIFFERENTIAL/PLATELET
Abs Immature Granulocytes: 0.03 K/uL (ref 0.00–0.07)
Basophils Absolute: 0 K/uL (ref 0.0–0.1)
Basophils Relative: 0 %
Eosinophils Absolute: 0 K/uL (ref 0.0–0.5)
Eosinophils Relative: 0 %
HCT: 44.4 % (ref 36.0–46.0)
Hemoglobin: 15.1 g/dL — ABNORMAL HIGH (ref 12.0–15.0)
Immature Granulocytes: 0 %
Lymphocytes Relative: 12 %
Lymphs Abs: 1 K/uL (ref 0.7–4.0)
MCH: 29.8 pg (ref 26.0–34.0)
MCHC: 34 g/dL (ref 30.0–36.0)
MCV: 87.7 fL (ref 80.0–100.0)
Monocytes Absolute: 0.4 K/uL (ref 0.1–1.0)
Monocytes Relative: 5 %
Neutro Abs: 6.5 K/uL (ref 1.7–7.7)
Neutrophils Relative %: 83 %
Platelets: 215 K/uL (ref 150–400)
RBC: 5.06 MIL/uL (ref 3.87–5.11)
RDW: 12.7 % (ref 11.5–15.5)
WBC: 7.9 K/uL (ref 4.0–10.5)
nRBC: 0 % (ref 0.0–0.2)

## 2023-07-31 NOTE — ED Provider Notes (Signed)
 [place] EMERGENCY DEPARTMENT AT [facility] Provider Note   CSN: [id number] Arrival date & time: 07/31/23  2234     Patient presents with: No chief complaint on file.   [name] is a 68 y.o. female.  {Add pertinent medical, surgical, social history, OB history to YEP:67052} The history is provided by the patient and a relative.  She has history of hypertension, diabetes, COPD and comes in by ambulance after falling at home.  She fell yesterday and was unable to get up.  Family states that she falls frequently and is not able to get up without assistance.  She did suffer some skin tears, is complaining of pain in her back.  Family did notice that her face was swollen.  Last tetanus is unknown.    Prior to Admission medications   Medication Sig Start Date End Date Taking? Authorizing Provider  Accu-Chek Softclix Lancets lancets Use as instructed to check blood sugars.  E11.69 04/04/23   Vicci Barnie NOVAK, MD  albuterol  (PROAIR  HFA) 108 (90 Base) MCG/ACT inhaler Inhale 1-2 puffs into the lungs every 6 (six) hours as needed for wheezing or shortness of breath. 12/17/22   Vicci Barnie NOVAK, MD  albuterol  (PROVENTIL ) (2.5 MG/3ML) 0.083% nebulizer solution Take 3 mLs (2.5 mg total) by nebulization every 6 (six) hours as needed for wheezing or shortness of breath. 04/25/23   Vicci Barnie NOVAK, MD  amLODipine  (NORVASC ) 10 MG tablet Take 1 tablet (10 mg total) by mouth daily. 12/31/22   Vicci Barnie NOVAK, MD  aspirin  81 MG tablet Take 81 mg by mouth daily.    [provider]  atorvastatin  (LIPITOR) 80 MG tablet Take 1 tablet (80 mg total) by mouth daily. 05/07/23   Vicci Barnie NOVAK, MD  Blood Glucose Monitoring Suppl (ACCU-CHEK GUIDE) w/Device KIT UAD to check blood sugars daily. E11.69 04/04/23   Vicci Barnie NOVAK, MD  carvedilol  (COREG ) 12.5 MG tablet Take 1 tablet (12.5 mg total) by mouth 2 (two) times daily with a meal. 12/17/22   Vicci Barnie NOVAK, MD  Continuous  Glucose Receiver (FREESTYLE LIBRE 2 READER) DEVI Use to check glucose continuously throughout the day. E11.69 12/17/22   Vicci Barnie NOVAK, MD  Continuous Glucose Sensor (FREESTYLE LIBRE 2 SENSOR) MISC Place 1 sensor on the skin every 14 days. Use to check glucose continuously. E11.69 12/17/22   Vicci Barnie NOVAK, MD  esomeprazole  (NEXIUM ) 40 MG capsule Take 1 capsule (40 mg total) by mouth daily. 12/17/22   Vicci Barnie NOVAK, MD  FLUoxetine  (PROZAC ) 40 MG capsule TAKE 1 CAPSULE BY MOUTH DAILY 04/25/23   Vicci Barnie NOVAK, MD  Fluticasone -Umeclidin-Vilant (TRELEGY ELLIPTA ) 100-62.5-25 MCG/ACT AEPB Take 1 puff by mouth daily. 02/20/23   Vicci Barnie NOVAK, MD  glucose blood (ACCU-CHEK GUIDE TEST) test strip Use as instructed to check blood sugars twice a day E11.69 04/04/23   Vicci Barnie NOVAK, MD  linaclotide  (LINZESS ) 145 MCG CAPS capsule TAKE 1 CAPSULE BY MOUTH DAILY  BEFORE BREAKFAST 07/10/23   Vicci Barnie NOVAK, MD  lisinopril -hydrochlorothiazide  (ZESTORETIC ) 20-12.5 MG tablet Take 0.5 tablets by mouth daily. Patient taking differently: Take 1 tablet by mouth daily. 04/16/23   Vicci Barnie NOVAK, MD  metFORMIN  (GLUCOPHAGE -XR) 500 MG 24 hr tablet TAKE 1 TABLET BY MOUTH TWICE  DAILY WITH MEALS 06/27/23   Vicci Barnie NOVAK, MD  montelukast  (SINGULAIR ) 10 MG tablet Take 1 tablet (10 mg total) by mouth at bedtime. 12/17/22   Vicci Barnie NOVAK, MD  nitroGLYCERIN  (  NITROSTAT ) 0.3 MG SL tablet DISSOLVE 1 TABLET UNDER THE  TONGUE EVERY 5 MINUTES AS NEEDED FOR CHEST PAIN. MAX OF 3 TABLETS IN 15 MINUTES. CALL 911 IF PAIN  PERSISTS. 04/02/22   Ngetich, Dinah C, NP  Semaglutide , 1 MG/DOSE, 4 MG/3ML SOPN Inject 1 mg as directed once a week. Patient not taking: Reported on 06/04/2023 02/08/23   Vicci Barnie NOVAK, MD  tiZANidine  (ZANAFLEX ) 2 MG tablet Take 2 mg by mouth every 6 (six) hours as needed for muscle spasms. 12/01/19   [provider]  traZODone  (DESYREL ) 50 MG tablet Take 1 tablet (50 mg total) by  mouth at bedtime as needed. Patient taking differently: Take 50 mg by mouth at bedtime as needed for sleep. 12/17/22   Vicci Barnie NOVAK, MD    Allergies: Flonase [fluticasone  propionate]    Review of Systems  All other systems reviewed and are negative.   Updated Vital Signs BP (!) 165/102 (BP Location: Right Arm)   Pulse 83   Temp 98.6 F (37 C) (Oral)   Resp 20   Ht 5' 7 (1.702 m)   Wt 84.8 kg   SpO2 96%   BMI 29.29 kg/m   Physical Exam Vitals and nursing note reviewed.   68 year old female, resting comfortably and in no acute distress. Vital signs are significant for elevated blood pressure. Oxygen  saturation is 96%, which is normal. Head is normocephalic.  There is soft tissue swelling of the left malar area but no tenderness. PERRLA, EOMI.  Neck is mildly tender in the midline without point tenderness.  There is no adenopathy. Back is nontender. Lungs are clear without rales, wheezes, or rhonchi. Chest is nontender. Heart has regular rate and rhythm without murmur. Abdomen is soft, flat, nontender. Extremities: There is a skin tear over the dorsal aspect of the left wrist but no swelling or deformity.  There is tenderness to palpation of the left shoulder and pain with passive range of motion but no deformity noted.  All other joints have full range of motion without significant pain. Skin is warm and dry without rash. Neurologic: Awake and alert, oriented to person and place and time.  Cranial nerves are intact.  She does move all extremities equally.  Family states that she is at her baseline mental status.  (all labs ordered are listed, but only abnormal results are displayed) Labs Reviewed - No data to display  EKG: None  Radiology: No results found.  {Document cardiac monitor, telemetry assessment procedure when appropriate:32947} Procedures   Medications Ordered in the ED - No data to display    {Click here for ABCD2, HEART and other calculators  REFRESH Note before signing:1}                              Medical Decision Making  Fall at home with inability to get up.  With prolonged time on the floor, I am concerned about possible dehydration as well as possible rhabdomyolysis.  Per family, this is not unusual for her.  I have ordered trauma scans.  I reviewed her past records, and note Tdap on 03/22/2021, no need for booster today.  She does have recent diagnosis of left-sided breast cancer with lumpectomy on 06/11/2023 and is scheduled to begin radiation therapy but is not getting any chemotherapy.  {Document critical care time when appropriate  Document review of labs and clinical decision tools ie CHADS2VASC2, etc  Document your  independent review of radiology images and any outside records  Document your discussion with family members, caretakers and with consultants  Document social determinants of health affecting pt's care  Document your decision making why or why not admission, treatments were needed:32947:::1}   Final diagnoses:  None    ED Discharge Orders     None

## 2023-07-31 NOTE — ED Triage Notes (Signed)
 PT BIB EMS from home. EMS reports pt was unable to contact pt through out today, pt found in bathroom floor unknown down time, fall estimated to be sometime last night per pt. Reports multiple recent falls. Skin tear left shoulder and wrist. L eye swollen from fall yesterday. Reports necks soreness. No thinners.   Hx dm, htn, copd  EMS VS 88% RA, 93% 2L, 182/118, HR 93, R 20

## 2023-08-01 ENCOUNTER — Observation Stay (HOSPITAL_COMMUNITY)

## 2023-08-01 ENCOUNTER — Emergency Department (HOSPITAL_COMMUNITY)

## 2023-08-01 DIAGNOSIS — R519 Headache, unspecified: Secondary | ICD-10-CM | POA: Diagnosis not present

## 2023-08-01 DIAGNOSIS — J449 Chronic obstructive pulmonary disease, unspecified: Secondary | ICD-10-CM | POA: Diagnosis not present

## 2023-08-01 DIAGNOSIS — J9611 Chronic respiratory failure with hypoxia: Secondary | ICD-10-CM

## 2023-08-01 DIAGNOSIS — G9389 Other specified disorders of brain: Secondary | ICD-10-CM | POA: Diagnosis not present

## 2023-08-01 DIAGNOSIS — N200 Calculus of kidney: Secondary | ICD-10-CM | POA: Diagnosis not present

## 2023-08-01 DIAGNOSIS — I771 Stricture of artery: Secondary | ICD-10-CM | POA: Diagnosis not present

## 2023-08-01 DIAGNOSIS — R220 Localized swelling, mass and lump, head: Secondary | ICD-10-CM | POA: Diagnosis not present

## 2023-08-01 DIAGNOSIS — M47812 Spondylosis without myelopathy or radiculopathy, cervical region: Secondary | ICD-10-CM | POA: Diagnosis not present

## 2023-08-01 DIAGNOSIS — J439 Emphysema, unspecified: Secondary | ICD-10-CM | POA: Diagnosis not present

## 2023-08-01 DIAGNOSIS — R109 Unspecified abdominal pain: Secondary | ICD-10-CM | POA: Diagnosis not present

## 2023-08-01 DIAGNOSIS — R079 Chest pain, unspecified: Secondary | ICD-10-CM | POA: Diagnosis not present

## 2023-08-01 DIAGNOSIS — I509 Heart failure, unspecified: Secondary | ICD-10-CM

## 2023-08-01 DIAGNOSIS — R296 Repeated falls: Secondary | ICD-10-CM

## 2023-08-01 DIAGNOSIS — J440 Chronic obstructive pulmonary disease with acute lower respiratory infection: Secondary | ICD-10-CM | POA: Diagnosis not present

## 2023-08-01 DIAGNOSIS — M6282 Rhabdomyolysis: Secondary | ICD-10-CM | POA: Diagnosis not present

## 2023-08-01 DIAGNOSIS — M542 Cervicalgia: Secondary | ICD-10-CM | POA: Diagnosis not present

## 2023-08-01 DIAGNOSIS — I6782 Cerebral ischemia: Secondary | ICD-10-CM | POA: Diagnosis not present

## 2023-08-01 DIAGNOSIS — R9089 Other abnormal findings on diagnostic imaging of central nervous system: Secondary | ICD-10-CM | POA: Diagnosis not present

## 2023-08-01 DIAGNOSIS — G44309 Post-traumatic headache, unspecified, not intractable: Secondary | ICD-10-CM | POA: Diagnosis not present

## 2023-08-01 DIAGNOSIS — Z043 Encounter for examination and observation following other accident: Secondary | ICD-10-CM | POA: Diagnosis not present

## 2023-08-01 LAB — URINALYSIS, ROUTINE W REFLEX MICROSCOPIC
Bilirubin Urine: NEGATIVE
Glucose, UA: NEGATIVE mg/dL
Hgb urine dipstick: NEGATIVE
Ketones, ur: 5 mg/dL — AB
Leukocytes,Ua: NEGATIVE
Nitrite: NEGATIVE
Protein, ur: NEGATIVE mg/dL
Specific Gravity, Urine: 1.013 (ref 1.005–1.030)
pH: 6 (ref 5.0–8.0)

## 2023-08-01 LAB — ECHOCARDIOGRAM COMPLETE
Area-P 1/2: 3.93 cm2
Calc EF: 66.3 %
Height: 67 in
S' Lateral: 2.8 cm
Single Plane A2C EF: 65.9 %
Single Plane A4C EF: 65.6 %
Weight: 2992 [oz_av]

## 2023-08-01 LAB — COMPREHENSIVE METABOLIC PANEL WITH GFR
ALT: 35 U/L (ref 0–44)
AST: 85 U/L — ABNORMAL HIGH (ref 15–41)
Albumin: 3.6 g/dL (ref 3.5–5.0)
Alkaline Phosphatase: 48 U/L (ref 38–126)
Anion gap: 15 (ref 5–15)
BUN: 10 mg/dL (ref 8–23)
CO2: 22 mmol/L (ref 22–32)
Calcium: 11.3 mg/dL — ABNORMAL HIGH (ref 8.9–10.3)
Chloride: 101 mmol/L (ref 98–111)
Creatinine, Ser: 0.66 mg/dL (ref 0.44–1.00)
GFR, Estimated: >60 mL/min (ref >60)
Glucose, Bld: 130 mg/dL — ABNORMAL HIGH (ref 70–99)
Potassium: 3.2 mmol/L — ABNORMAL LOW (ref 3.5–5.1)
Sodium: 138 mmol/L (ref 135–145)
Total Bilirubin: 1.1 mg/dL (ref 0.0–1.2)
Total Protein: 7.4 g/dL (ref 6.5–8.1)

## 2023-08-01 LAB — BASIC METABOLIC PANEL WITH GFR
Anion gap: 11 (ref 5–15)
BUN: 8 mg/dL (ref 8–23)
CO2: 22 mmol/L (ref 22–32)
Calcium: 10.4 mg/dL — ABNORMAL HIGH (ref 8.9–10.3)
Chloride: 106 mmol/L (ref 98–111)
Creatinine, Ser: 0.39 mg/dL — ABNORMAL LOW (ref 0.44–1.00)
GFR, Estimated: >60 mL/min (ref >60)
Glucose, Bld: 104 mg/dL — ABNORMAL HIGH (ref 70–99)
Potassium: 3.2 mmol/L — ABNORMAL LOW (ref 3.5–5.1)
Sodium: 139 mmol/L (ref 135–145)

## 2023-08-01 LAB — URINALYSIS, W/ REFLEX TO CULTURE (INFECTION SUSPECTED)
Bilirubin Urine: NEGATIVE
Glucose, UA: NEGATIVE mg/dL
Hgb urine dipstick: NEGATIVE
Ketones, ur: 20 mg/dL — AB
Leukocytes,Ua: NEGATIVE
Nitrite: NEGATIVE
Protein, ur: NEGATIVE mg/dL
Specific Gravity, Urine: 1.008 (ref 1.005–1.030)
pH: 6 (ref 5.0–8.0)

## 2023-08-01 LAB — CBG MONITORING, ED
Glucose-Capillary: 103 mg/dL — ABNORMAL HIGH (ref 70–99)
Glucose-Capillary: 140 mg/dL — ABNORMAL HIGH (ref 70–99)
Glucose-Capillary: 121 mg/dL — ABNORMAL HIGH (ref 70–99)
Glucose-Capillary: 107 mg/dL — ABNORMAL HIGH (ref 70–99)

## 2023-08-01 LAB — CBC
HCT: 42.7 % (ref 36.0–46.0)
Hemoglobin: 14.3 g/dL (ref 12.0–15.0)
MCH: 29.9 pg (ref 26.0–34.0)
MCHC: 33.5 g/dL (ref 30.0–36.0)
MCV: 89.1 fL (ref 80.0–100.0)
Platelets: 210 K/uL (ref 150–400)
RBC: 4.79 MIL/uL (ref 3.87–5.11)
RDW: 12.8 % (ref 11.5–15.5)
WBC: 6.9 K/uL (ref 4.0–10.5)
nRBC: 0 % (ref 0.0–0.2)

## 2023-08-01 LAB — PHOSPHORUS: Phosphorus: 2.5 mg/dL (ref 2.5–4.6)

## 2023-08-01 LAB — CK
Total CK: 4395 U/L — ABNORMAL HIGH (ref 38–234)
Total CK: 3400 U/L — ABNORMAL HIGH (ref 38–234)

## 2023-08-01 LAB — LACTIC ACID, PLASMA: Lactic Acid, Venous: 1 mmol/L (ref 0.5–1.9)

## 2023-08-01 LAB — MAGNESIUM: Magnesium: 1.3 mg/dL — ABNORMAL LOW (ref 1.7–2.4)

## 2023-08-01 MED ORDER — PANTOPRAZOLE SODIUM 40 MG PO TBEC
40.0000 mg | DELAYED_RELEASE_TABLET | Freq: Every day | ORAL | Status: DC
  Administered 2023-08-01 – 2023-08-03 (×3): 40 mg via ORAL
  Filled 2023-08-01 (×3): qty 1

## 2023-08-01 MED ORDER — SODIUM CHLORIDE 0.9 % IV BOLUS
1000.0000 mL | Freq: Once | INTRAVENOUS | Status: AC
  Administered 2023-08-01: 1000 mL via INTRAVENOUS

## 2023-08-01 MED ORDER — SODIUM CHLORIDE 0.9 % IV SOLN: INTRAVENOUS | Status: AC

## 2023-08-01 MED ORDER — INSULIN ASPART 100 UNIT/ML IJ SOLN: 0.0000 [IU] | Freq: Every day | INTRAMUSCULAR | Status: DC

## 2023-08-01 MED ORDER — TRAMADOL HCL 50 MG PO TABS
50.0000 mg | ORAL_TABLET | Freq: Four times a day (QID) | ORAL | Status: DC | PRN
  Administered 2023-08-02 – 2023-08-03 (×2): 50 mg via ORAL
  Filled 2023-08-01 (×2): qty 1

## 2023-08-01 MED ORDER — METHOCARBAMOL 500 MG PO TABS
500.0000 mg | ORAL_TABLET | Freq: Three times a day (TID) | ORAL | Status: DC | PRN
  Filled 2023-08-01: qty 1

## 2023-08-01 MED ORDER — ONDANSETRON HCL 4 MG/2ML IJ SOLN: 4.0000 mg | Freq: Three times a day (TID) | INTRAMUSCULAR | Status: DC | PRN

## 2023-08-01 MED ORDER — LIDOCAINE 5 % EX PTCH
2.0000 | MEDICATED_PATCH | CUTANEOUS | Status: DC
  Administered 2023-08-01 – 2023-08-04 (×4): 2 via TRANSDERMAL
  Filled 2023-08-01 (×4): qty 2

## 2023-08-01 MED ORDER — IOHEXOL 350 MG/ML SOLN
75.0000 mL | Freq: Once | INTRAVENOUS | Status: AC | PRN
  Administered 2023-08-01: 75 mL via INTRAVENOUS

## 2023-08-01 MED ORDER — BACITRACIN ZINC 500 UNIT/GM EX OINT
TOPICAL_OINTMENT | Freq: Every day | CUTANEOUS | Status: DC
  Administered 2023-08-02 – 2023-08-04 (×3): 31.5 via TOPICAL
  Filled 2023-08-01: qty 28.4

## 2023-08-01 MED ORDER — ACETAMINOPHEN 325 MG PO TABS
650.0000 mg | ORAL_TABLET | Freq: Four times a day (QID) | ORAL | Status: DC | PRN
  Filled 2023-08-01: qty 2

## 2023-08-01 MED ORDER — NITROGLYCERIN 0.4 MG SL SUBL: 0.4000 mg | SUBLINGUAL_TABLET | SUBLINGUAL | Status: DC | PRN

## 2023-08-01 MED ORDER — INSULIN ASPART 100 UNIT/ML IJ SOLN
0.0000 [IU] | Freq: Three times a day (TID) | INTRAMUSCULAR | Status: DC
  Administered 2023-08-01 – 2023-08-02 (×3): 1 [IU] via SUBCUTANEOUS
  Administered 2023-08-02 – 2023-08-04 (×3): 2 [IU] via SUBCUTANEOUS

## 2023-08-01 MED ORDER — POTASSIUM CHLORIDE CRYS ER 20 MEQ PO TBCR
40.0000 meq | EXTENDED_RELEASE_TABLET | Freq: Once | ORAL | Status: AC
  Administered 2023-08-01: 40 meq via ORAL
  Filled 2023-08-01: qty 2

## 2023-08-01 MED ORDER — ALBUTEROL SULFATE (2.5 MG/3ML) 0.083% IN NEBU: 2.5000 mg | INHALATION_SOLUTION | Freq: Four times a day (QID) | RESPIRATORY_TRACT | Status: DC | PRN

## 2023-08-01 MED ORDER — LINACLOTIDE 145 MCG PO CAPS
290.0000 ug | ORAL_CAPSULE | Freq: Every day | ORAL | Status: DC
  Administered 2023-08-01 – 2023-08-04 (×4): 290 ug via ORAL
  Filled 2023-08-01 (×5): qty 2

## 2023-08-01 MED ORDER — KETOROLAC TROMETHAMINE 15 MG/ML IJ SOLN
15.0000 mg | Freq: Three times a day (TID) | INTRAMUSCULAR | Status: DC | PRN
  Administered 2023-08-02: 15 mg via INTRAVENOUS
  Filled 2023-08-01: qty 1

## 2023-08-01 MED ORDER — ASPIRIN 81 MG PO CHEW
81.0000 mg | CHEWABLE_TABLET | Freq: Every day | ORAL | Status: DC
  Administered 2023-08-01 – 2023-08-04 (×4): 81 mg via ORAL
  Filled 2023-08-01 (×4): qty 1

## 2023-08-01 MED ORDER — FLUOXETINE HCL 20 MG PO CAPS
40.0000 mg | ORAL_CAPSULE | Freq: Every day | ORAL | Status: DC
  Administered 2023-08-01 – 2023-08-04 (×4): 40 mg via ORAL
  Filled 2023-08-01 (×4): qty 2

## 2023-08-01 MED ORDER — MAGNESIUM SULFATE 4 GM/100ML IV SOLN
4.0000 g | Freq: Once | INTRAVENOUS | Status: AC
  Administered 2023-08-01: 4 g via INTRAVENOUS
  Filled 2023-08-01: qty 100

## 2023-08-01 MED ORDER — MELATONIN 3 MG PO TABS
3.0000 mg | ORAL_TABLET | Freq: Every evening | ORAL | Status: DC | PRN
  Administered 2023-08-04: 3 mg via ORAL
  Filled 2023-08-01: qty 1

## 2023-08-01 MED ORDER — POLYETHYLENE GLYCOL 3350 17 G PO PACK: 17.0000 g | PACK | Freq: Every day | ORAL | Status: DC | PRN

## 2023-08-01 MED ORDER — HYDRALAZINE HCL 20 MG/ML IJ SOLN: 10.0000 mg | Freq: Four times a day (QID) | INTRAMUSCULAR | Status: DC | PRN

## 2023-08-01 MED ORDER — CARVEDILOL 12.5 MG PO TABS
12.5000 mg | ORAL_TABLET | Freq: Two times a day (BID) | ORAL | Status: DC
  Administered 2023-08-01 – 2023-08-04 (×8): 12.5 mg via ORAL
  Filled 2023-08-01 (×8): qty 1

## 2023-08-01 NOTE — Evaluation (Incomplete)
 Physical Therapy Evaluation Patient Details Name: [name] MRN: [id number] DOB: [birth year] Today's Date: 08/01/2023  History of Present Illness  Pt is a 68 y/o female admitted 7/16 after fall at home with a prolonged downtime.  Work up includes rhabdo and continues.  PMHx:  ASD, Breast CA, COPD, DM2, HTN,  Clinical Impression  ***        If plan is discharge home, recommend the following:     Can travel by private vehicle        Equipment Recommendations    Recommendations for Other Services       Functional Status Assessment       Precautions / Restrictions Precautions Precautions: Fall Recall of Precautions/Restrictions: Intact      Mobility  Bed Mobility Overal bed mobility: Needs Assistance Bed Mobility: Supine to Sit, Sit to Supine     Supine to sit: Mod assist Sit to supine: Mod assist   General bed mobility comments: due to pain, pt needed light moderate truncal assist up and LE back in bed.    Transfers Overall transfer level: Needs assistance   Transfers: Sit to/from Stand, Bed to chair/wheelchair/BSC Sit to Stand: Min assist          Lateral/Scoot Transfers: Min assist General transfer comment: steps toward HOB, weak and limited .    Ambulation/Gait               General Gait Details: side steps only  Stairs            Wheelchair Mobility     Tilt Bed    Modified Rankin (Stroke Patients Only)       Balance                                             Pertinent Vitals/Pain Pain Assessment Pain Assessment: Faces Faces Pain Scale: Hurts even more Pain Location: L side > R side pain Pain Descriptors / Indicators: Burning, Discomfort, Moaning, Sore Pain Intervention(s): Monitored during session, Limited activity within patient's tolerance    Home Living Family/patient expects to be discharged to:: Private residence Living Arrangements: Alone Available Help at Discharge: Family (Mom asked pt  to come stay with her after d/c for a while) Type of Home: Apartment Home Access: Stairs to enter   [employer] of Steps: flight to 2nd floor apt./   Home Layout: One level Home Equipment: Cane - single point;Shower seat;BSC/3in1 Additional Comments: no friends in the complex, no family close, but MOM is not far.    Prior Function Prior Level of Function : Independent/Modified Independent (but struggled lately)             Mobility Comments: used cane, fell alot ADLs Comments: independent per pt.  bus to get groceries.     Extremity/Trunk Assessment   Upper Extremity Assessment Upper Extremity Assessment:  (mild R side weakness,  L UE> L LE weakness and pain.)    Lower Extremity Assessment Lower Extremity Assessment:  (bil general weakness and soreness L>R)    Cervical / Trunk Assessment Cervical / Trunk Assessment: Normal  Communication   Communication Communication: No apparent difficulties Factors Affecting Communication: Reduced clarity of speech    Cognition Arousal: Alert Behavior During Therapy: Flat affect   PT - Cognitive impairments: No apparent impairments  Following commands: Intact       Cueing Cueing Techniques: Verbal cues, Tactile cues     General Comments      Exercises     Assessment/Plan    PT Assessment    PT Problem List         PT Treatment Interventions      PT Goals (Current goals can be found in the Care Plan section)       Frequency       Co-evaluation               AM-PAC PT 6 Clicks Mobility  Outcome Measure                  End of Session              Time:  -      Charges:                 {enter signature dotphrase here}  [name] 08/01/2023, 7:18 PM

## 2023-08-01 NOTE — ED Notes (Signed)
 Pt placed on bedpan at this time.

## 2023-08-01 NOTE — ED Notes (Signed)
 Called CCMD but heart still blue

## 2023-08-01 NOTE — Progress Notes (Signed)
 Transition of Care [facility]) - CAGE-AID Screening   Patient Details  Name: [name] MRN: [id number] Date of Birth: May 10, 1955   MARINDA LIONEL Sora, RN Phone Number: 08/01/2023, 5:57 AM   Clinical Narrative:  Pt denies etoh/tobacco/elicit substance usage, no resources needed.   CAGE-AID Screening:    Have You Ever Felt You Ought to Cut Down on Your Drinking or Drug Use?: No Have People Annoyed You By Critizing Your Drinking Or Drug Use?: No Have You Felt Bad Or Guilty About Your Drinking Or Drug Use?: No Have You Ever Had a Drink or Used Drugs First Thing In The Morning to Steady Your Nerves or to Get Rid of a Hangover?: No CAGE-AID Score: 0  Substance Abuse Education Offered: No

## 2023-08-01 NOTE — ED Notes (Signed)
 Pt transported to MRI

## 2023-08-01 NOTE — ED Notes (Signed)
 NS calling IT for broken scanner in pts room

## 2023-08-01 NOTE — ED Notes (Signed)
 Urine occurrence, complete bed change, pt repositioned.

## 2023-08-01 NOTE — ED Notes (Signed)
 Pt bladder scan x3 with greatest volume of 134 mL

## 2023-08-01 NOTE — ED Notes (Signed)
 Pt had bm NT cleaned pt and washed pt up

## 2023-08-01 NOTE — ED Notes (Signed)
 NT assisted pt with using the bedpan, pt could not urinate at the time will call out when feels the need to go again.

## 2023-08-01 NOTE — Care Management Obs Status (Signed)
 MEDICARE OBSERVATION STATUS NOTIFICATION   Patient Details  Name: [name] MRN: [id number] Date of Birth: 22-Jan-1955   Medicare Observation Status Notification Given:  Yes    Vermell Madrid, RN 08/01/2023, 5:54 PM

## 2023-08-01 NOTE — H&P (Signed)
 History and Physical    [name] FMW:[id number] DOB: [birth year] DOA: 07/31/2023  DOS: the patient was seen and examined on 07/31/2023  PCP: Vicci Barnie NOVAK, MD   Patient coming from: Home   I have personally briefly reviewed patient's old medical records in [facility] and CareEverywhere  HPI:   [name] is a 68 y.o. year old female with past medical history of essential hypertension, CAD, DM type II, COPD, hyperlipidemia, generalized anxiety disorder, Sleep apnea, sarcoidosis in remission, constipation, and chronic muscle spasms. 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 She presents to [facility] ED after fall at home with a prolonged down time in which she was unable to get up, family had not made contact with her in a little over a day. Per family she has frequent falls and is often unable to get up without assistance.  [name] reports she has had intermittent falls since 2022 and attributes them to weakness. She does state that sometimes she blacks out with these falls, though she denies blacking prior to this most recent fall.  Denies dizziness, palpitations, chest pain, dyspnea, or change in sensorium preceding the falls.  ED Triage Note: PT BIB EMS from home. EMS reports pt was unable to contact pt through out today, pt found in bathroom floor unknown down time, fall estimated to be sometime last night per pt. Reports multiple recent falls. Skin tear left shoulder and wrist. L eye swollen from fall yesterday. Reports necks soreness. No thinners.   Hx dm, htn, copd   EMS VS 88% RA, 93% 2L, 182/118, HR 93, R 20  ED Course: On arrival to [facility] ED patient was noted to be afebrile temp 37 C, BP 165.2, HR 83, RR 20, SpO2 96% on 2 L via nasal cannula.  Labs notable for CK 4395,  potassium 3.2, calcium  11.3, AST 85, mag 1.3, UA with many bacteria, negative leukocyte, negative nitrite, 0-5 WBC without report of dysuria.  CT head and C-spine obtained and both negative for acute abnormalities.  CT maxillofacial obtained and shows periorbital soft tissue swelling.  CT chest brain shows emphysematous changes, slightly increased opacity in the left upper lobe no focal infiltrates, and a stable left adrenal lesion consistent with adenoma with no recommended follow-up. (R) nonobstructive renal stone seen.  She was given 2 L normal saline bolus and started on continuous IVF as well as 40 mEq of potassium.  TRH contacted for admission.   Review of Systems: As mentioned in the history of present illness. All other systems reviewed and are negative.    Past Medical History:  Diagnosis Date   Anxiety    Arthritis    ASD (atrial septal defect)    TEE 08/19/14: Very small insignificant fenestrated atrial septal defect (2 defects) with very mildly positive double contrast study for right to left shunting   Asthma    Breast cancer (HCC)    left   Chest pain 2016   Non-ischemic stress test 07/05/14   COPD (chronic obstructive pulmonary disease) (HCC)    Depression    DM type 2 (diabetes mellitus, type 2) (HCC)    GERD (gastroesophageal reflux disease)    History of home oxygen  therapy    Hypertension    Pneumonia    Shortness of  breath dyspnea    Sleep apnea    does not use CPAP    Past Surgical History:  Procedure Laterality Date   BREAST BIOPSY Left 03/11/2023   US  LT BREAST BX W LOC DEV 1ST LESION IMG BX SPEC US  GUIDE 03/11/2023 [facility]   BREAST BIOPSY Left 06/07/2023   US  LT RADIOACTIVE SEED LOC 06/07/2023 [facility]   BREAST LUMPECTOMY WITH RADIOACTIVE SEED LOCALIZATION Left 06/11/2023   Procedure: BREAST LUMPECTOMY WITH RADIOACTIVE SEED LOCALIZATION;  Surgeon: Belinda Cough, MD;  Location: [facility] OR;  Service: General;  Laterality: Left;  LEFT BREAST  RADIOACTIVE SEED LOCALIZED LUMPECTOMY   TEE WITHOUT CARDIOVERSION N/A 08/24/2014   Procedure: TRANSESOPHAGEAL ECHOCARDIOGRAM (TEE);  Surgeon: Gordy Bergamo, MD;  Location: [facility] ENDOSCOPY;  Service: Cardiovascular;  Laterality: N/A;   TOTAL ABDOMINAL HYSTERECTOMY  01/15/2005     reports that she quit smoking about 15 years ago. Her smoking use included cigarettes. She started smoking about 53 years ago. She has a 38 pack-year smoking history. She has never used smokeless tobacco. She reports that she does not drink alcohol and does not use drugs.  Allergies  Allergen Reactions   Flonase [Fluticasone  Propionate] Other (See Comments)    Nose bleeds    Family History  Problem Relation Age of Onset   Allergies Mother    Allergies Daughter    Asthma Son        had as a child   BRCA 1/2 Neg Hx    Breast cancer Neg Hx     Prior to Admission medications   Medication Sig Start Date End Date Taking? Authorizing Provider  albuterol  (PROAIR  HFA) 108 (90 Base) MCG/ACT inhaler Inhale 1-2 puffs into the lungs every 6 (six) hours as needed for wheezing or shortness of breath. 12/17/22  Yes Vicci Barnie NOVAK, MD  amLODipine  (NORVASC ) 10 MG tablet Take 1 tablet (10 mg total) by mouth daily. 12/31/22  Yes Vicci Barnie NOVAK, MD  aspirin  81 MG tablet Take 81 mg by mouth daily.   Yes [provider]  atorvastatin  (LIPITOR) 80 MG tablet Take 1 tablet (80 mg total) by mouth daily. 05/07/23  Yes Vicci Barnie NOVAK, MD  carvedilol  (COREG ) 12.5 MG tablet Take 1 tablet (12.5 mg total) by mouth 2 (two) times daily with a meal. 12/17/22  Yes Vicci Barnie NOVAK, MD  esomeprazole  (NEXIUM ) 40 MG capsule Take 1 capsule (40 mg total) by mouth daily. 12/17/22  Yes Vicci Barnie NOVAK, MD  FLUoxetine  (PROZAC ) 40 MG capsule TAKE 1 CAPSULE BY MOUTH DAILY 04/25/23  Yes Vicci Barnie NOVAK, MD  Fluticasone -Umeclidin-Vilant (TRELEGY ELLIPTA ) 100-62.5-25 MCG/ACT AEPB Take 1 puff by mouth daily. 02/20/23  Yes Vicci Barnie NOVAK, MD   linaclotide  (LINZESS ) 145 MCG CAPS capsule TAKE 1 CAPSULE BY MOUTH DAILY  BEFORE BREAKFAST 07/10/23  Yes Vicci Barnie NOVAK, MD  lisinopril -hydrochlorothiazide  (ZESTORETIC ) 20-12.5 MG tablet Take 0.5 tablets by mouth daily. Patient taking differently: Take 1 tablet by mouth daily. 04/16/23  Yes Vicci Barnie NOVAK, MD  metFORMIN  (GLUCOPHAGE -XR) 500 MG 24 hr tablet TAKE 1 TABLET BY MOUTH TWICE  DAILY WITH MEALS 06/27/23  Yes Vicci Barnie NOVAK, MD  montelukast  (SINGULAIR ) 10 MG tablet Take 1 tablet (10 mg total) by mouth at bedtime. 12/17/22  Yes Vicci Barnie NOVAK, MD  nitroGLYCERIN  (NITROSTAT ) 0.3 MG SL tablet DISSOLVE 1 TABLET UNDER THE  TONGUE EVERY 5 MINUTES AS NEEDED FOR CHEST PAIN. MAX OF 3 TABLETS IN 15 MINUTES. CALL 911 IF PAIN  PERSISTS. 04/02/22  Yes Ngetich, Dinah C, NP  tiZANidine  (ZANAFLEX ) 2 MG tablet Take 2 mg by mouth every 8 (eight) hours as needed for muscle spasms. 12/01/19  Yes [provider]  traZODone  (DESYREL ) 50 MG tablet Take 1 tablet (50 mg total) by mouth at bedtime as needed. Patient taking differently: Take 50 mg by mouth at bedtime as needed for sleep. 12/17/22  Yes Vicci Barnie NOVAK, MD  Accu-Chek Softclix Lancets lancets Use as instructed to check blood sugars.  E11.69 04/04/23   Vicci Barnie NOVAK, MD  albuterol  (PROVENTIL ) (2.5 MG/3ML) 0.083% nebulizer solution Take 3 mLs (2.5 mg total) by nebulization every 6 (six) hours as needed for wheezing or shortness of breath. Patient not taking: Reported on 08/01/2023 04/25/23   Vicci Barnie NOVAK, MD  Blood Glucose Monitoring Suppl (ACCU-CHEK GUIDE) w/Device KIT UAD to check blood sugars daily. E11.69 04/04/23   Vicci Barnie NOVAK, MD  Continuous Glucose Receiver (FREESTYLE LIBRE 2 READER) DEVI Use to check glucose continuously throughout the day. E11.69 12/17/22   Vicci Barnie NOVAK, MD  Continuous Glucose Sensor (FREESTYLE LIBRE 2 SENSOR) MISC Place 1 sensor on the skin every 14 days. Use to check glucose continuously.  E11.69 Patient not taking: Reported on 08/01/2023 12/17/22   Vicci Barnie NOVAK, MD  glucose blood (ACCU-CHEK GUIDE TEST) test strip Use as instructed to check blood sugars twice a day E11.69 04/04/23   Vicci Barnie NOVAK, MD  Semaglutide , 1 MG/DOSE, 4 MG/3ML SOPN Inject 1 mg as directed once a week. Patient not taking: Reported on 06/04/2023 02/08/23   Vicci Barnie NOVAK, MD    Physical Exam: Vitals:   08/01/23 0500 08/01/23 0515 08/01/23 0615 08/01/23 0638  BP: (!) 160/91 (!) 170/97 (!) 162/83   Pulse: 79 78 75   Resp: (!) 24 16 20    Temp:    98.1 F (36.7 C)  TempSrc:    Oral  SpO2: 95% 100% 95%   Weight:      Height:         Constitutional: Elderly African-American lady in NAD Eyes: PERRL, (L) ptosis noted Neck: normal, supple, no masses, no thyromegaly Respiratory: clear to auscultation bilaterally, no wheezing, no crackles. Normal respiratory effort. No accessory muscle use.  Cardiovascular: Regular rate and rhythm, no murmurs / rubs / gallops. No extremity edema. 2+ radial and pedal pulses.  Abdomen: Soft, nontender.  Bowel sounds positive x4 quadrants.  Musculoskeletal: no clubbing / cyanosis. No joint deformity upper and lower extremities. Good ROM, no contractures.  Decreased muscle tone.  Skin: Warm, dry.  No rashes, lesions, ulcers.  Neurologic:  Alert and oriented x 3. (L) ptosis. (L) facial weakness noted when asked to puff cheeks.    Labs on Admission: I have personally reviewed following labs and imaging studies  CBC: Recent Labs  Lab 07/31/23 2340 08/01/23 0558  WBC 7.9 6.9  NEUTROABS 6.5  --   HGB 15.1* 14.3  HCT 44.4 42.7  MCV 87.7 89.1  PLT 215 210   Basic Metabolic Panel: Recent Labs  Lab 07/31/23 2340 08/01/23 0558  NA 138 139  K 3.2* 3.2*  CL 101 106  CO2 22 22  GLUCOSE 130* 104*  BUN 10 8  CREATININE 0.66 0.39*  CALCIUM  11.3* 10.4*  MG  --  1.3*  PHOS  --  2.5   GFR: Estimated Creatinine Clearance: 75.3 mL/min (A) (by C-G formula  based on SCr of 0.39 mg/dL (L)). Liver Function Tests: Recent Labs  Lab 07/31/23 2340  AST 85*  ALT 35  ALKPHOS 48  BILITOT 1.1  PROT 7.4  ALBUMIN 3.6   No results for input(s): LIPASE, AMYLASE in the last 168 hours. No results for input(s): AMMONIA in the last 168 hours. Coagulation Profile: No results for input(s): INR, PROTIME in the last 168 hours. Cardiac Enzymes: Recent Labs  Lab 07/31/23 2340 08/01/23 0558  CKTOTAL 4,395* 3,400*   BNP (last 3 results) No results for input(s): BNP in the last 8760 hours. HbA1C: No results for input(s): HGBA1C in the last 72 hours. CBG: No results for input(s): GLUCAP in the last 168 hours. Lipid Profile: No results for input(s): CHOL, HDL, LDLCALC, TRIG, CHOLHDL, LDLDIRECT in the last 72 hours. Thyroid  Function Tests: No results for input(s): TSH, T4TOTAL, FREET4, T3FREE, THYROIDAB in the last 72 hours. Anemia Panel: No results for input(s): VITAMINB12, FOLATE, FERRITIN, TIBC, IRON, RETICCTPCT in the last 72 hours. Urine analysis:    Component Value Date/Time   COLORURINE YELLOW 08/01/2023 0115   APPEARANCEUR HAZY (A) 08/01/2023 0115   LABSPEC 1.008 08/01/2023 0115   PHURINE 6.0 08/01/2023 0115   GLUCOSEU NEGATIVE 08/01/2023 0115   HGBUR NEGATIVE 08/01/2023 0115   BILIRUBINUR NEGATIVE 08/01/2023 0115   KETONESUR 20 (A) 08/01/2023 0115   PROTEINUR NEGATIVE 08/01/2023 0115   UROBILINOGEN 1.0 08/01/2008 1550   NITRITE NEGATIVE 08/01/2023 0115   LEUKOCYTESUR NEGATIVE 08/01/2023 0115    Radiological Exams on Admission: I have personally reviewed images CT CHEST ABDOMEN PELVIS W CONTRAST Result Date: 08/01/2023 CLINICAL DATA:  Recent fall with chest and abdominal pain, initial encounter EXAM: CT CHEST, ABDOMEN, AND PELVIS WITH CONTRAST TECHNIQUE: Multidetector CT imaging of the chest, abdomen and pelvis was performed following the standard protocol during bolus administration of  intravenous contrast. RADIATION DOSE REDUCTION: This exam was performed according to the departmental dose-optimization program which includes automated exposure control, adjustment of the mA and/or kV according to patient size and/or use of iterative reconstruction technique. CONTRAST:  75mL OMNIPAQUE  IOHEXOL  350 MG/ML SOLN COMPARISON:  None Available. FINDINGS: CT CHEST FINDINGS Cardiovascular: Atherosclerotic calcifications of the thoracic aorta are noted. No aneurysmal dilatation or dissection is seen. Coronary calcifications are noted. No cardiac enlargement is noted. No large central pulmonary embolus is seen. Timing was not performed for embolus evaluation. Mediastinum/Nodes: Thoracic inlet is within normal limits. No hilar or mediastinal adenopathy is noted. The esophagus is within normal limits. Lungs/Pleura: Lungs are well aerated bilaterally. Diffuse emphysematous changes are seen. Calcified granuloma is noted in the right upper lobe. Slight increased density is noted in the lateral aspect of the left upper lobe diffusely which may represent some very early pneumonic infiltrate. No sizable effusion is seen. Musculoskeletal: No chest wall mass or suspicious bone lesions identified. CT ABDOMEN PELVIS FINDINGS Hepatobiliary: No focal liver abnormality is seen. No gallstones, gallbladder wall thickening, or biliary dilatation. Pancreas: Unremarkable. No pancreatic ductal dilatation or surrounding inflammatory changes. Spleen: Normal in size without focal abnormality. Adrenals/Urinary Tract: Right adrenal gland is within normal limits. Enhancing left adrenal lesion is noted measuring approximately 2.5 cm stable in appearance from prior exam. This is stable over multiple previous exams consistent adenoma. Kidneys show normal enhancement pattern bilaterally. Punctate nonobstructing stone is noted in the upper pole of the right kidney. Ureters are within normal limits. The bladder is well distended.  Stomach/Bowel: No obstructive or inflammatory changes of the colon are seen. Scattered fecal material is noted throughout the colon without obstructive change. Terminal ileum is within normal limits. The appendix is unremarkable. Small bowel  shows no obstructive change. The stomach is decompressed. Vascular/Lymphatic: Aortic atherosclerosis. No enlarged abdominal or pelvic lymph nodes. Reproductive: Status post hysterectomy. No adnexal masses. Other: No abdominal wall hernia or abnormality. No abdominopelvic ascites. Musculoskeletal: No acute or significant osseous findings. Chronic changes about the right hip joint noted. IMPRESSION: CT of the chest: Emphysematous changes as well as findings of prior granulomatous disease. Some slight increased opacity is noted in the left upper lobe peripherally which may represent some very early inflammatory change. No focal confluent infiltrate is noted. CT of the abdomen and pelvis: Stable left adrenal lesion consistent with adenoma. No further follow-up is recommended. Punctate nonobstructing right renal stone. No other focal abnormality is noted. Electronically Signed   By: Oneil Devonshire M.D.   On: 08/01/2023 02:21   CT Head Wo Contrast Result Date: 08/01/2023 CLINICAL DATA:  Recent fall with headaches, initial encounter EXAM: CT HEAD WITHOUT CONTRAST TECHNIQUE: Contiguous axial images were obtained from the base of the skull through the vertex without intravenous contrast. RADIATION DOSE REDUCTION: This exam was performed according to the departmental dose-optimization program which includes automated exposure control, adjustment of the mA and/or kV according to patient size and/or use of iterative reconstruction technique. COMPARISON:  None Available. FINDINGS: Brain: No evidence of acute infarction, hemorrhage, hydrocephalus, extra-axial collection or mass lesion/mass effect. Some septum pellucidum is noted which is a normal variant. Mild atrophic changes and chronic  white matter ischemic changes are seen. Vascular: No hyperdense vessel or unexpected calcification. Skull: Normal. Negative for fracture or focal lesion. Sinuses/Orbits: No acute finding. Other: None. IMPRESSION: Chronic atrophic and ischemic changes.  No acute abnormality noted. Electronically Signed   By: Oneil Devonshire M.D.   On: 08/01/2023 02:05   CT Maxillofacial Wo Contrast Result Date: 08/01/2023 CLINICAL DATA:  Recent fall with facial pain, initial encounter EXAM: CT MAXILLOFACIAL WITHOUT CONTRAST TECHNIQUE: Multidetector CT imaging of the maxillofacial structures was performed. Multiplanar CT image reconstructions were also generated. RADIATION DOSE REDUCTION: This exam was performed according to the departmental dose-optimization program which includes automated exposure control, adjustment of the mA and/or kV according to patient size and/or use of iterative reconstruction technique. COMPARISON:  None Available. FINDINGS: Osseous: No acute fracture is identified. Orbits: Orbits and their contents are within normal limits. Sinuses: Paranasal sinuses are unremarkable. Soft tissues: Soft tissue structures show very mild soft tissue swelling in the left supraorbital region consistent with the recent injury. No other focal abnormality is noted. Limited intracranial: Within normal limits. IMPRESSION: No acute fracture is noted. Mild left supraorbital soft tissue swelling. Electronically Signed   By: Oneil Devonshire M.D.   On: 08/01/2023 02:04   CT Cervical Spine Wo Contrast Result Date: 08/01/2023 CLINICAL DATA:  Recent fall with neck pain, initial encounter EXAM: CT CERVICAL SPINE WITHOUT CONTRAST TECHNIQUE: Multidetector CT imaging of the cervical spine was performed without intravenous contrast. Multiplanar CT image reconstructions were also generated. RADIATION DOSE REDUCTION: This exam was performed according to the departmental dose-optimization program which includes automated exposure control,  adjustment of the mA and/or kV according to patient size and/or use of iterative reconstruction technique. COMPARISON:  None Available. FINDINGS: Alignment: Mild loss of the normal cervical lordosis is noted. Skull base and vertebrae: 7 cervical segments are well visualized. Vertebral body height is well maintained. Mild osteophytic change and facet hypertrophic changes are noted. No acute fracture or acute facet abnormality is seen. Soft tissues and spinal canal: Surrounding soft tissue structures are within normal limits. Upper chest: Visualized  lung apices demonstrate emphysematous changes. Other: None IMPRESSION: Mild degenerative change of the cervical spine without acute abnormality. Electronically Signed   By: Oneil Devonshire M.D.   On: 08/01/2023 02:02   DG Shoulder Left Result Date: 07/31/2023 CLINICAL DATA:  Fall EXAM: LEFT SHOULDER - 2+ VIEW COMPARISON:  None Available. FINDINGS: Moderate AC joint degenerative change. Mild glenohumeral degenerative change. No fracture or malalignment IMPRESSION: Degenerative changes. Electronically Signed   By: Luke Bun M.D.   On: 07/31/2023 23:27    EKG: My personal interpretation of EKG shows: Normal sinus rhythm HR 84 with first-degree AV block and left bundle branch block with ST changes that are more pronounced from prior   Assessment/Plan Principal Problem:   Rhabdomyolysis   Active medical issues: ##Recurrent Falls - Orthostatic vital signs  - ECHO - Cardiac Monitoring - Check Troponin - CT Head-->Negative - MRI Brain - Neuro checks - Multimodal pain control (Tylenol , Toradol , tramadol , Robaxin , lidocaine  patch) - PT/OT eval and treat  ##Rhabdomyolysis - NS at 100 mL/hr - Check CK and Renal function with AM labs.  ##Hypokalemia ##Hypomagnesemia - 4g IV Mg - s/p 40 mEQ K in ED - Repeat electrolytes with AM labs - Replete as needed  Chronic medical issues: #Chronic hypoxic respiratory failure on 2-3 L [place] with exertion and at  night #COPD - Continue home Trelegy Ellipta  - Continue PRN nebulizers  #Hypertension BP elevated in setting of missing doses of home meds during prolonged down time after fall -Continue home Coreg    #Coronary Artery Disease #Hyperlipidemia Not on statin outpatient - Continue home baby aspirin   # Type 2 diabetes mellitus On metformin  outpatient - [facility]CHS SSI and CBG monitoring  #OSA Previously on CPAP nightly, has not used since moving to [place] from [place]  in 2022 - at bedtime CPAP  #Depression #Generalized anxiety disorder -Continue home Prozac   VTE prophylaxis:  SCDs  GI prophylaxis: Protonix  Diet: Heart Healthy Access: PIV Lines: None Code Status:  Full Code Telemetry: Yes  Admission status: Observation, Telemetry bed Patient is from: Home  Anticipated d/c is to: Home with HH/SNF  Anticipated d/c date is: 1-2 days Patient currently: Receiving IV fluid hydration, pending MRI and echo, pending continued improvement of CK  Family Communication: Son Ozell updated at [phone]  Consults called: N/A    Severity of Illness: The appropriate patient status for this patient is OBSERVATION. Observation status is judged to be reasonable and necessary in order to provide the required intensity of service to ensure the patient's safety. The patient's presenting symptoms, physical exam findings, and initial radiographic and laboratory data in the context of their medical condition is felt to place them at decreased risk for further clinical deterioration. Furthermore, it is anticipated that the patient will be medically stable for discharge from the hospital within 2 midnights of admission.   To reach the provider On-Call:   7AM- 7PM see care teams to locate the attending and reach out to them via [email or web address] Password: [email or web address] 7PM-7AM contact night-coverage If you still have difficulty reaching the appropriate provider, please page the [facility] (Director on Call) for [facility]  on amion for assistance  This document was prepared using Conservation officer, historic buildings and may include unintentional dictation errors.  Rockie Rams FNP-BC, PMHNP-BC Nurse Practitioner [facility]

## 2023-08-02 ENCOUNTER — Telehealth: Payer: Self-pay | Admitting: Internal Medicine

## 2023-08-02 DIAGNOSIS — E119 Type 2 diabetes mellitus without complications: Secondary | ICD-10-CM | POA: Diagnosis present

## 2023-08-02 DIAGNOSIS — M62838 Other muscle spasm: Secondary | ICD-10-CM | POA: Diagnosis present

## 2023-08-02 DIAGNOSIS — R296 Repeated falls: Secondary | ICD-10-CM | POA: Diagnosis present

## 2023-08-02 DIAGNOSIS — E876 Hypokalemia: Secondary | ICD-10-CM | POA: Diagnosis present

## 2023-08-02 DIAGNOSIS — W19XXXA Unspecified fall, initial encounter: Secondary | ICD-10-CM | POA: Diagnosis present

## 2023-08-02 DIAGNOSIS — G4733 Obstructive sleep apnea (adult) (pediatric): Secondary | ICD-10-CM | POA: Diagnosis present

## 2023-08-02 DIAGNOSIS — K219 Gastro-esophageal reflux disease without esophagitis: Secondary | ICD-10-CM | POA: Diagnosis present

## 2023-08-02 DIAGNOSIS — T796XXA Traumatic ischemia of muscle, initial encounter: Secondary | ICD-10-CM | POA: Diagnosis present

## 2023-08-02 DIAGNOSIS — J9611 Chronic respiratory failure with hypoxia: Secondary | ICD-10-CM | POA: Diagnosis present

## 2023-08-02 DIAGNOSIS — Z170 Estrogen receptor positive status [ER+]: Secondary | ICD-10-CM | POA: Diagnosis not present

## 2023-08-02 DIAGNOSIS — Z90710 Acquired absence of both cervix and uterus: Secondary | ICD-10-CM | POA: Diagnosis not present

## 2023-08-02 DIAGNOSIS — Z87891 Personal history of nicotine dependence: Secondary | ICD-10-CM | POA: Diagnosis not present

## 2023-08-02 DIAGNOSIS — Z510 Encounter for antineoplastic radiation therapy: Secondary | ICD-10-CM | POA: Diagnosis not present

## 2023-08-02 DIAGNOSIS — M6282 Rhabdomyolysis: Secondary | ICD-10-CM | POA: Diagnosis not present

## 2023-08-02 DIAGNOSIS — E860 Dehydration: Secondary | ICD-10-CM | POA: Diagnosis present

## 2023-08-02 DIAGNOSIS — F32A Depression, unspecified: Secondary | ICD-10-CM | POA: Diagnosis present

## 2023-08-02 DIAGNOSIS — Z7984 Long term (current) use of oral hypoglycemic drugs: Secondary | ICD-10-CM | POA: Diagnosis not present

## 2023-08-02 DIAGNOSIS — Y92009 Unspecified place in unspecified non-institutional (private) residence as the place of occurrence of the external cause: Secondary | ICD-10-CM | POA: Diagnosis not present

## 2023-08-02 DIAGNOSIS — Z825 Family history of asthma and other chronic lower respiratory diseases: Secondary | ICD-10-CM | POA: Diagnosis not present

## 2023-08-02 DIAGNOSIS — I2510 Atherosclerotic heart disease of native coronary artery without angina pectoris: Secondary | ICD-10-CM | POA: Diagnosis present

## 2023-08-02 DIAGNOSIS — C50512 Malignant neoplasm of lower-outer quadrant of left female breast: Secondary | ICD-10-CM | POA: Diagnosis not present

## 2023-08-02 DIAGNOSIS — J4489 Other specified chronic obstructive pulmonary disease: Secondary | ICD-10-CM | POA: Diagnosis present

## 2023-08-02 DIAGNOSIS — E785 Hyperlipidemia, unspecified: Secondary | ICD-10-CM | POA: Diagnosis present

## 2023-08-02 DIAGNOSIS — Z79899 Other long term (current) drug therapy: Secondary | ICD-10-CM | POA: Diagnosis not present

## 2023-08-02 DIAGNOSIS — Z7982 Long term (current) use of aspirin: Secondary | ICD-10-CM | POA: Diagnosis not present

## 2023-08-02 DIAGNOSIS — I10 Essential (primary) hypertension: Secondary | ICD-10-CM | POA: Diagnosis present

## 2023-08-02 DIAGNOSIS — F411 Generalized anxiety disorder: Secondary | ICD-10-CM | POA: Diagnosis present

## 2023-08-02 DIAGNOSIS — C50912 Malignant neoplasm of unspecified site of left female breast: Secondary | ICD-10-CM | POA: Diagnosis present

## 2023-08-02 LAB — CBC WITH DIFFERENTIAL/PLATELET
Abs Immature Granulocytes: 0.03 K/uL (ref 0.00–0.07)
Basophils Absolute: 0 K/uL (ref 0.0–0.1)
Basophils Relative: 1 %
Eosinophils Absolute: 0.1 K/uL (ref 0.0–0.5)
Eosinophils Relative: 2 %
HCT: 42.7 % (ref 36.0–46.0)
Hemoglobin: 13.8 g/dL (ref 12.0–15.0)
Immature Granulocytes: 0 %
Lymphocytes Relative: 22 %
Lymphs Abs: 1.6 K/uL (ref 0.7–4.0)
MCH: 29.5 pg (ref 26.0–34.0)
MCHC: 32.3 g/dL (ref 30.0–36.0)
MCV: 91.2 fL (ref 80.0–100.0)
Monocytes Absolute: 0.6 K/uL (ref 0.1–1.0)
Monocytes Relative: 8 %
Neutro Abs: 4.8 K/uL (ref 1.7–7.7)
Neutrophils Relative %: 67 %
Platelets: 165 K/uL (ref 150–400)
RBC: 4.68 MIL/uL (ref 3.87–5.11)
RDW: 13.2 % (ref 11.5–15.5)
WBC: 7.1 K/uL (ref 4.0–10.5)
nRBC: 0 % (ref 0.0–0.2)

## 2023-08-02 LAB — BASIC METABOLIC PANEL WITH GFR
Anion gap: 10 (ref 5–15)
BUN: 8 mg/dL (ref 8–23)
CO2: 21 mmol/L — ABNORMAL LOW (ref 22–32)
Calcium: 10 mg/dL (ref 8.9–10.3)
Chloride: 108 mmol/L (ref 98–111)
Creatinine, Ser: 0.6 mg/dL (ref 0.44–1.00)
GFR, Estimated: >60 mL/min (ref >60)
Glucose, Bld: 111 mg/dL — ABNORMAL HIGH (ref 70–99)
Potassium: 3.8 mmol/L (ref 3.5–5.1)
Sodium: 139 mmol/L (ref 135–145)

## 2023-08-02 LAB — LIPID PANEL
Cholesterol: 108 mg/dL (ref 0–200)
HDL: 41 mg/dL (ref >40)
LDL Cholesterol: 57 mg/dL (ref 0–99)
Total CHOL/HDL Ratio: 2.6 ratio
Triglycerides: 49 mg/dL (ref <150)
VLDL: 10 mg/dL (ref 0–40)

## 2023-08-02 LAB — BRAIN NATRIURETIC PEPTIDE: B Natriuretic Peptide: 162.4 pg/mL — ABNORMAL HIGH (ref 0.0–100.0)

## 2023-08-02 LAB — GLUCOSE, CAPILLARY
Glucose-Capillary: 107 mg/dL — ABNORMAL HIGH (ref 70–99)
Glucose-Capillary: 114 mg/dL — ABNORMAL HIGH (ref 70–99)
Glucose-Capillary: 123 mg/dL — ABNORMAL HIGH (ref 70–99)
Glucose-Capillary: 135 mg/dL — ABNORMAL HIGH (ref 70–99)
Glucose-Capillary: 174 mg/dL — ABNORMAL HIGH (ref 70–99)

## 2023-08-02 LAB — PROCALCITONIN: Procalcitonin: <0.1 ng/mL

## 2023-08-02 LAB — CK: Total CK: 1678 U/L — ABNORMAL HIGH (ref 38–234)

## 2023-08-02 LAB — MAGNESIUM: Magnesium: 1.4 mg/dL — ABNORMAL LOW (ref 1.7–2.4)

## 2023-08-02 MED ORDER — MAGNESIUM SULFATE 4 GM/100ML IV SOLN
4.0000 g | Freq: Once | INTRAVENOUS | Status: AC
  Administered 2023-08-02: 4 g via INTRAVENOUS
  Filled 2023-08-02: qty 100

## 2023-08-02 MED ORDER — SODIUM CHLORIDE 0.9 % IV SOLN: INTRAVENOUS | Status: AC

## 2023-08-02 MED ORDER — HYDRALAZINE HCL 50 MG PO TABS
50.0000 mg | ORAL_TABLET | Freq: Three times a day (TID) | ORAL | Status: DC
  Administered 2023-08-02 – 2023-08-05 (×9): 50 mg via ORAL
  Filled 2023-08-02 (×10): qty 1

## 2023-08-02 MED ORDER — HEPARIN SODIUM (PORCINE) 5000 UNIT/ML IJ SOLN
5000.0000 [IU] | Freq: Three times a day (TID) | INTRAMUSCULAR | Status: DC
  Administered 2023-08-02 – 2023-08-05 (×10): 5000 [IU] via SUBCUTANEOUS
  Filled 2023-08-02 (×10): qty 1

## 2023-08-02 NOTE — Evaluation (Signed)
 Physical Therapy Evaluation Patient Details Name: [name] MRN: [id number] DOB: June 25, 1955 Today's Date: 08/02/2023  History of Present Illness  Pt is a 69 y/o female admitted 7/16 after fall at home with a prolonged downtime.  Work up includes rhabdo and continues.  PMHx:  ASD, Breast CA, COPD, DM2, HTN,  Clinical Impression  Pt is presenting below baseline level of functioning. Currently pt is limited by fatigue and soreness from fall. Pt has scabbed scratched are on the lateral L lower leg from previous fall. Currently pt is Mod I for bed mobility, CGA for sit to stand and very short distance non-functional gait with RW. Pt can stay with her mother who can help with light tasks but not physically assist. Pt is about to start radiation therapy at [facility]. Due to pt current functional status, home set up and available assistance at home recommending skilled physical therapy services 3x/week in order to address strength, balance and functional mobility to decrease risk for falls, injury and re-hospitalization.         If plan is discharge home, recommend the following: Help with stairs or ramp for entrance;Assistance with cooking/housework;Assist for transportation     Equipment Recommendations None recommended by PT     Functional Status Assessment Patient has had a recent decline in their functional status and demonstrates the ability to make significant improvements in function in a reasonable and predictable amount of time.     Precautions / Restrictions Precautions Precautions: Fall Recall of Precautions/Restrictions: Intact Restrictions Weight Bearing Restrictions Per Provider Order: No      Mobility  Bed Mobility Overal bed mobility: Needs Assistance Bed Mobility: Supine to Sit, Sit to Supine   Sidelying to sit: Contact guard assist Supine to sit: Contact guard     General bed mobility comments: CGA use of bed rails.    Transfers Overall transfer level: Needs  assistance Equipment used: Rolling walker (2 wheels) Transfers: Sit to/from Stand Sit to Stand: Contact guard assist           General transfer comment: CGA for sit to stand with cues for safe hand placement    Ambulation/Gait Ambulation/Gait assistance: Contact guard assist Gait Distance (Feet): 4 Feet Assistive device: Rolling walker (2 wheels) Gait Pattern/deviations: Step-through pattern, Decreased stride length Gait velocity: decreased Gait velocity interpretation: <1.31 ft/sec, indicative of household ambulator   General Gait Details: 4 ft fwd/back and side steps at CGA. LImited due to fatigue today.    Balance Overall balance assessment: Needs assistance   Sitting balance-Leahy Scale: Fair       Standing balance-Leahy Scale: Fair Standing balance comment: reliant on RW no overt LOB         Pertinent Vitals/Pain Pain Assessment Pain Assessment: Faces Faces Pain Scale: Hurts little more Pain Location: generalized Pain Descriptors / Indicators: Grimacing, Guarding Pain Intervention(s): Monitored during session, Limited activity within patient's tolerance    Home Living Family/patient expects to be discharged to:: Private residence Living Arrangements: Alone Available Help at Discharge: Family (can stay with her 9 year old mom) Type of Home: Apartment Home Access: Stairs to enter   Entrance Stairs-Number of Steps: flight to 2nd floor apt./   Home Layout: One level Home Equipment: Cane - single point;Shower seat;BSC/3in1 Additional Comments: report staff in apartment complex got pt up out of the parking lot prior to her fall in her bathroom. pt states she can go stay with her mother who lives at [employer]    Prior Function  Prior Level of Function : Independent/Modified Independent             Mobility Comments: used cane, fell alot ADLs Comments: independent per pt.  bus to get groceries.     Extremity/Trunk Assessment   Upper Extremity  Assessment Upper Extremity Assessment: Defer to OT evaluation    Lower Extremity Assessment Lower Extremity Assessment: Generalized weakness       Communication   Communication Communication: No apparent difficulties    Cognition Arousal: Alert Behavior During Therapy: Flat affect   PT - Cognitive impairments: No apparent impairments       Following commands: Intact       Cueing Cueing Techniques: Verbal cues, Tactile cues            Assessment/Plan    PT Assessment Patient needs continued PT services  PT Problem List Decreased strength;Decreased activity tolerance;Decreased balance;Decreased mobility       PT Treatment Interventions DME instruction;Balance training;Gait training;Stair training;Functional mobility training;Therapeutic activities;Therapeutic exercise;Patient/family education    PT Goals (Current goals can be found in the Care Plan section)  Acute Rehab PT Goals Patient Stated Goal: to return home or go stay with mom PT Goal Formulation: With patient Time For Goal Achievement: 08/16/23 Potential to Achieve Goals: Good    Frequency Min 2X/week        AM-PAC PT 6 Clicks Mobility  Outcome Measure Help needed turning from your back to your side while in a flat bed without using bedrails?: A Little Help needed moving from lying on your back to sitting on the side of a flat bed without using bedrails?: A Little Help needed moving to and from a bed to a chair (including a wheelchair)?: A Little Help needed standing up from a chair using your arms (e.g., wheelchair or bedside chair)?: A Little Help needed to walk in hospital room?: A Little Help needed climbing 3-5 steps with a railing? : A Lot 6 Click Score: 17    End of Session Equipment Utilized During Treatment: Gait belt Activity Tolerance: Patient tolerated treatment well Patient left: in bed;with call bell/phone within reach;with bed alarm set Nurse Communication: Mobility status PT  Visit Diagnosis: Unsteadiness on feet (R26.81);Muscle weakness (generalized) (M62.81)    Time: 8467-8451 PT Time Calculation (min) (ACUTE ONLY): 16 min   Charges:   PT Evaluation $PT Eval Low Complexity: 1 Low   PT General Charges $$ ACUTE PT VISIT: 1 Visit         Dorothyann Maier, DPT, CLT  Acute Rehabilitation Services Office: [phone] (Secure chat preferred)   Dorothyann VEAR Maier 08/02/2023, 3:53 PM

## 2023-08-02 NOTE — Care Management (Signed)
 Transition of Care [facility]) - Inpatient Brief Assessment   Patient Details  Name: [name] MRN: [id number] Date of Birth: February 16, 1955  Transition [facility]) CM/SW Contact:    Corean JAYSON Canary, RN Phone Number: 08/02/2023, 6:12 PM   Clinical Narrative:  68 year old lives alone uses cane  but has frequent falls. Mother is going to come stay with her. PT recommended HH, OT SNF. Patient wishes to go home at present. Does not have any HH in [place]. Patient cites transportation issues, placed [employer] transportation information on AVS . The patient  has a appointment with her PCP on July 22, on AVS  TOC will follow for needs  Transition of Care Asessment: Insurance and Status: Insurance coverage has been reviewed Patient has primary care physician: Yes Home environment has been reviewed: Lives slone in apartment has frequent falls Prior level of function:: Uses cane, has weakness Prior/Current Home Services: No current home services Social Drivers of Health Review: SDOH reviewed interventions complete Readmission risk has been reviewed: Yes Transition of care needs: transition of care needs identified, TOC will continue to follow

## 2023-08-02 NOTE — Progress Notes (Addendum)
 PROGRESS NOTE                                                                                                                                                                                                             Patient Demographics:    [name], is a 67 y.o. female, DOB - 1955-04-30, FMW:[id number]  Outpatient Primary MD for the patient is Vicci Barnie NOVAK, MD    LOS - 0  Admit date - 07/31/2023    No chief complaint on file.      Brief Narrative (HPI from H&P)   68 y.o. year old female with past medical history of essential hypertension, CAD, DM type II, COPD, hyperlipidemia, generalized anxiety disorder, Sleep apnea, sarcoidosis in remission, constipation, and chronic muscle spasms. 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 She presents to [facility] ED after fall at home with a prolonged down time in which she was unable to get up, family had not made contact with her in a little over a day. Per family she has frequent falls and is often unable to get up without assistance.  [name] reports she has had intermittent falls since 2022 and attributes them to weakness. She does state that sometimes she blacks out with these falls, though she denies blacking prior to this most recent fall.  Denies dizziness, palpitations, chest pain, dyspnea, or change in sensorium preceding the falls.    Subjective:    [name] today has, No headache, No chest pain, No abdominal pain - No Nausea, No new weakness tingling or numbness, no SOB   Assessment  & Plan :    Dehydration, generalized weakness, recurrent falls with rhabdomyolysis, hypokalemia and hypomagnesemia, in a patient with recent diagnosis of breast cancer. - Presentation is most  consistent with dehydration causing generalized weakness and possible orthostatic hypotension, electrolytes will be replaced, since she has recent diagnosis of breast cancer MRI brain was done which did not show any acute changes or metastatic disease, does show some chronic microvascular disease for which she is already on aspirin  and statin at home which will be continued, no headache or focal deficits.  CT head and  C-spine negative.  Echocardiogram stable.  Continue hydration with IV fluids, continue replacement of electrolytes, await orthostatic blood pressure check, await PT OT, patient is elderly and lives by herself.  Once symptoms are better will be discharged safely hopefully to home versus SNF.  Monitor CK levels which are improving.    #Chronic hypoxic respiratory failure on 2-3 L [place] with exertion and at night with underlying history of COPD  - No signs of exacerbation, no shortness of breath, no oxygen  need, no wheezing, continue home Trelegy Ellipta  and breathing treatments.   #Hypertension -Continue home Coreg , add hydralazine  oral scheduled along with as needed IV for better control.   #Coronary Artery Disease - no chest pain, EKG with LVH phenomenon, echocardiogram with preserved EF and no wall motion abnormality, continue aspirin , beta-blocker and statin.  #Hyperlipidemia - unclear if she takes statin at home, LDL below 70 recheck home medication list   #OSA Previously on CPAP nightly, has not used since moving to  from [place]  in 2022, no acute issues outpatient follow-up with PCP and pulmonary as needed.   #Depression, Generalized anxiety disorder -Continue home Prozac    # Type 2 diabetes mellitus - On metformin  outpatient, - ACHS SSI and CBG monitoring while she is inpatient, holding metformin .  Lab Results  Component Value Date   HGBA1C 6.2 (H) 06/05/2023    CBG (last 3)  Recent Labs    08/01/23 2248 08/02/23 0028 08/02/23 0748  GLUCAP 107* 107* 114*          Condition - Fair  Family Communication  :  None present  Code Status :   Full  Consults  :  None  PUD Prophylaxis : PPI   Procedures  :           Disposition Plan  :    Status is: Observation   DVT Prophylaxis  :    heparin injection 5,000 Units Start: 08/02/23 0845 Place and maintain sequential compression device Start: 08/01/23 0816   Lab Results  Component Value Date   PLT 165 08/02/2023    Diet :  Diet Order             Diet heart healthy/carb modified Room service appropriate? Yes; Fluid consistency: Thin  Diet effective now                    Inpatient Medications  Scheduled Meds:  aspirin   81 mg Oral Daily   bacitracin    Topical Daily   carvedilol   12.5 mg Oral BID WC   FLUoxetine   40 mg Oral Daily   heparin injection (subcutaneous)  5,000 Units Subcutaneous Q8H   hydrALAZINE   50 mg Oral Q8H   insulin  aspart  0-5 Units Subcutaneous QHS   insulin  aspart  0-9 Units Subcutaneous TID WC   lidocaine   2 patch Transdermal Q24H   linaclotide   290 mcg Oral QAC breakfast   pantoprazole   40 mg Oral Daily   Continuous Infusions:  sodium chloride      magnesium  sulfate bolus IVPB     PRN Meds:.acetaminophen , albuterol , hydrALAZINE , ketorolac , melatonin, methocarbamol , nitroGLYCERIN , ondansetron  (ZOFRAN ) IV, polyethylene glycol, traMADol   Antibiotics  :    Anti-infectives (From admission, onward)    None         Objective:   Vitals:   08/01/23 2230 08/01/23 2249 08/01/23 2250 08/02/23 0744  BP:   (!) 158/80 (!) 179/86  Pulse:   64   Resp:   (!) 24   Temp: 99.1  F (37.3 C) 98.1 F (36.7 C)  98.5 F (36.9 C)  TempSrc: Oral Oral  Oral  SpO2:   97%   Weight:      Height:        Wt Readings from Last 3 Encounters:  07/31/23 84.8 kg  07/10/23 82.4 kg  06/11/23 83 kg     Intake/Output Summary (Last 24 hours) at 08/02/2023 0755 Last data filed at 08/02/2023 0748 Gross per 24 hour  Intake 101.36 ml  Output 900 ml  Net -798.64  ml     Physical Exam  Awake Alert, No new F.N deficits, Normal affect [place].AT,PERRAL Supple Neck, No JVD,   Symmetrical Chest wall movement, Good air movement bilaterally, CTAB RRR,No Gallops,Rubs or new Murmurs,  +ve B.Sounds, Abd Soft, No tenderness,   No Cyanosis, Clubbing or edema      Data Review:    Recent Labs  Lab 07/31/23 2340 08/01/23 0558 08/02/23 0520  WBC 7.9 6.9 7.1  HGB 15.1* 14.3 13.8  HCT 44.4 42.7 42.7  PLT 215 210 165  MCV 87.7 89.1 91.2  MCH 29.8 29.9 29.5  MCHC 34.0 33.5 32.3  RDW 12.7 12.8 13.2  LYMPHSABS 1.0  --  1.6  MONOABS 0.4  --  0.6  EOSABS 0.0  --  0.1  BASOSABS 0.0  --  0.0    Recent Labs  Lab 07/31/23 2340 08/01/23 0558 08/02/23 0520  NA 138 139 139  K 3.2* 3.2* 3.8  CL 101 106 108  CO2 22 22 21*  ANIONGAP 15 11 10   GLUCOSE 130* 104* 111*  BUN 10 8 8   CREATININE 0.66 0.39* 0.60  AST 85*  --   --   ALT 35  --   --   ALKPHOS 48  --   --   BILITOT 1.1  --   --   ALBUMIN 3.6  --   --   LATICACIDVEN  --  1.0  --   BNP  --   --  162.4*  MG  --  1.3* 1.4*  PHOS  --  2.5  --   CALCIUM  11.3* 10.4* 10.0      Recent Labs  Lab 07/31/23 2340 08/01/23 0558 08/02/23 0520  LATICACIDVEN  --  1.0  --   BNP  --   --  162.4*  MG  --  1.3* 1.4*  CALCIUM  11.3* 10.4* 10.0    --------------------------------------------------------------------------------------------------------------- Lab Results  Component Value Date   CHOL 108 08/02/2023   HDL 41 08/02/2023   LDLCALC 57 08/02/2023   TRIG 49 08/02/2023   CHOLHDL 2.6 08/02/2023    Lab Results  Component Value Date   HGBA1C 6.2 (H) 06/05/2023     Micro Results No results found for this or any previous visit (from the past 240 hours).  Radiology Report ECHOCARDIOGRAM COMPLETE Result Date: 08/01/2023    ECHOCARDIOGRAM REPORT   Patient Name:   [name] Date of Exam: 08/01/2023 Medical Rec #:  [id number]     Height:       67.0 in Accession #:    [id number]    Weight:        187.0 lb Date of Birth:  December 27, 1955     BSA:          1.965 m Patient Age:    68 years      BP:           154/86 mmHg Patient Gender: F  HR:           67 bpm. Exam Location:  Inpatient Procedure: 2D Echo, Cardiac Doppler and Color Doppler (Both Spectral and Color            Flow Doppler were utilized during procedure). Indications:    CHF  History:        Patient has no prior history of Echocardiogram examinations.  Sonographer:    Therisa Crouch Referring Phys: JACQUELINE Sotirios Navarro K [facility] IMPRESSIONS  1. Left ventricular ejection fraction, by estimation, is 60 to 65%. Left ventricular ejection fraction by 2D MOD biplane is 66.3 %. The left ventricle has normal function. The left ventricle has no regional wall motion abnormalities. Left ventricular diastolic parameters were normal. The average left ventricular global longitudinal strain is -20.2 %. The global longitudinal strain is normal.  2. Right ventricular systolic function is normal. The right ventricular size is normal.  3. There is no evidence of cardiac tamponade.  4. The mitral valve is normal in structure. No evidence of mitral valve regurgitation. No evidence of mitral stenosis.  5. The aortic valve is normal in structure. Aortic valve regurgitation is not visualized. No aortic stenosis is present.  6. The inferior vena cava is normal in size with greater than 50% respiratory variability, suggesting right atrial pressure of 3 mmHg. FINDINGS  Left Ventricle: Left ventricular ejection fraction, by estimation, is 60 to 65%. Left ventricular ejection fraction by 2D MOD biplane is 66.3 %. The left ventricle has normal function. The left ventricle has no regional wall motion abnormalities. The average left ventricular global longitudinal strain is -20.2 %. Strain was performed and the global longitudinal strain is normal. The left ventricular internal cavity size was normal in size. There is no left ventricular hypertrophy. Left ventricular diastolic  parameters were normal. Right Ventricle: The right ventricular size is normal. No increase in right ventricular wall thickness. Right ventricular systolic function is normal. Left Atrium: Left atrial size was normal in size. Right Atrium: Right atrial size was normal in size. Pericardium: Trivial pericardial effusion is present. There is no evidence of cardiac tamponade. Mitral Valve: The mitral valve is normal in structure. No evidence of mitral valve regurgitation. No evidence of mitral valve stenosis. Tricuspid Valve: The tricuspid valve is normal in structure. Tricuspid valve regurgitation is not demonstrated. No evidence of tricuspid stenosis. Aortic Valve: The aortic valve is normal in structure. Aortic valve regurgitation is not visualized. No aortic stenosis is present. Pulmonic Valve: The pulmonic valve was normal in structure. Pulmonic valve regurgitation is not visualized. No evidence of pulmonic stenosis. Aorta: The aortic root is normal in size and structure. Venous: The inferior vena cava is normal in size with greater than 50% respiratory variability, suggesting right atrial pressure of 3 mmHg. IAS/Shunts: No atrial level shunt detected by color flow Doppler.  LEFT VENTRICLE PLAX 2D                        Biplane EF (MOD) LVIDd:         4.20 cm         LV Biplane EF:   Left LVIDs:         2.80 cm                          ventricular LV PW:         0.70 cm  ejection LV IVS:        0.80 cm                          fraction by LVOT diam:     2.00 cm                          2D MOD LVOT Area:     3.14 cm                         biplane is                                                 66.3 %.  LV Volumes (MOD)               Diastology LV vol d, MOD    49.8 ml       LV e' medial:    5.22 cm/s A2C:                           LV E/e' medial:  13.9 LV vol d, MOD    76.8 ml       LV e' lateral:   10.10 cm/s A4C:                           LV E/e' lateral: 7.2 LV vol s, MOD    17.0 ml  A2C:                           2D Longitudinal LV vol s, MOD    26.4 ml       Strain A4C:                           2D Strain GLS   -20.2 % LV SV MOD A2C:   32.8 ml       Avg: LV SV MOD A4C:   76.8 ml LV SV MOD BP:    42.4 ml RIGHT VENTRICLE             IVC RV Basal diam:  2.80 cm     IVC diam: 1.30 cm RV S prime:     11.00 cm/s TAPSE (M-mode): 2.4 cm LEFT ATRIUM             Index LA diam:        3.70 cm 1.88 cm/m LA Vol (A2C):   44.7 ml 22.74 ml/m LA Vol (A4C):   51.4 ml 26.15 ml/m LA Biplane Vol: 48.5 ml 24.68 ml/m   AORTA Ao Root diam: 2.70 cm MITRAL VALVE MV Area (PHT): 3.93 cm    SHUNTS MV Decel Time: 193 msec    Systemic Diam: 2.00 cm MV E velocity: 72.80 cm/s MV A velocity: 74.90 cm/s MV E/A ratio:  0.97 Morene Brownie Electronically signed by Morene Brownie Signature Date/Time: 08/01/2023/6:51:26 PM    Final    MR BRAIN WO CONTRAST Result Date: 08/01/2023 CLINICAL DATA:  68 year old female with recent fall. Neurologic deficit. EXAM: MRI HEAD WITHOUT CONTRAST TECHNIQUE: Multiplanar, multiecho pulse sequences of the brain and surrounding structures were obtained  without intravenous contrast. COMPARISON:  Head CT 0152 hours today. FINDINGS: Brain: No restricted diffusion or evidence of acute infarction. Numerous chronic microhemorrhages in the brain, although concentrated in the deep gray nuclei, the brainstem, deep cerebellum. Less pronounced scattered cerebral hemisphere involvement, superior convexities of both hemispheres spared. Superimposed numerous chronic lacunar infarcts in the bilateral deep gray matter nuclei. Chronic lacunar infarcts in the bilateral pons. Moderate T2 and FLAIR hyperintensity, and pronounced volume loss of the corpus callosum with severe chronic encephalomalacia in the central body of the corpus series 4, image 13. No superimposed cortical encephalomalacia identified. Cavum septum pellucidum, normal variant. No midline shift, mass effect, evidence of mass lesion,  ventriculomegaly, extra-axial collection or acute intracranial hemorrhage. Cervicomedullary junction and pituitary are within normal limits. Vascular: Major intracranial vascular flow voids are preserved. Generalized intracranial artery tortuosity. Skull and upper cervical spine: Negative for age visible cervical spine. Visualized bone marrow signal is within normal limits. Sinuses/Orbits: Mildly Disconjugate gaze. Otherwise negative orbits and sinuses. Other: Visible internal auditory structures appear normal. Negative visible scalp and face. IMPRESSION: 1. No acute intracranial abnormality. 2. Severe chronic changes in the brain, including atrophied corpus callosum, numerous chronic microhemorrhages, chronic lacunar infarcts throughout the deep gray nuclei and brainstem. Constellation of findings strongly favors advanced chronic small vessel disease over other considerations (such as chronic demyelinating disease). The appearance is not convincing for amyloid angiopathy, although that entity would be difficult to exclude. Electronically Signed   By: VEAR Hurst M.D.   On: 08/01/2023 12:37   CT CHEST ABDOMEN PELVIS W CONTRAST Result Date: 08/01/2023 CLINICAL DATA:  Recent fall with chest and abdominal pain, initial encounter EXAM: CT CHEST, ABDOMEN, AND PELVIS WITH CONTRAST TECHNIQUE: Multidetector CT imaging of the chest, abdomen and pelvis was performed following the standard protocol during bolus administration of intravenous contrast. RADIATION DOSE REDUCTION: This exam was performed according to the departmental dose-optimization program which includes automated exposure control, adjustment of the mA and/or kV according to patient size and/or use of iterative reconstruction technique. CONTRAST:  75mL OMNIPAQUE  IOHEXOL  350 MG/ML SOLN COMPARISON:  None Available. FINDINGS: CT CHEST FINDINGS Cardiovascular: Atherosclerotic calcifications of the thoracic aorta are noted. No aneurysmal dilatation or dissection is  seen. Coronary calcifications are noted. No cardiac enlargement is noted. No large central pulmonary embolus is seen. Timing was not performed for embolus evaluation. Mediastinum/Nodes: Thoracic inlet is within normal limits. No hilar or mediastinal adenopathy is noted. The esophagus is within normal limits. Lungs/Pleura: Lungs are well aerated bilaterally. Diffuse emphysematous changes are seen. Calcified granuloma is noted in the right upper lobe. Slight increased density is noted in the lateral aspect of the left upper lobe diffusely which may represent some very early pneumonic infiltrate. No sizable effusion is seen. Musculoskeletal: No chest wall mass or suspicious bone lesions identified. CT ABDOMEN PELVIS FINDINGS Hepatobiliary: No focal liver abnormality is seen. No gallstones, gallbladder wall thickening, or biliary dilatation. Pancreas: Unremarkable. No pancreatic ductal dilatation or surrounding inflammatory changes. Spleen: Normal in size without focal abnormality. Adrenals/Urinary Tract: Right adrenal gland is within normal limits. Enhancing left adrenal lesion is noted measuring approximately 2.5 cm stable in appearance from prior exam. This is stable over multiple previous exams consistent adenoma. Kidneys show normal enhancement pattern bilaterally. Punctate nonobstructing stone is noted in the upper pole of the right kidney. Ureters are within normal limits. The bladder is well distended. Stomach/Bowel: No obstructive or inflammatory changes of the colon are seen. Scattered fecal material is noted throughout the colon  without obstructive change. Terminal ileum is within normal limits. The appendix is unremarkable. Small bowel shows no obstructive change. The stomach is decompressed. Vascular/Lymphatic: Aortic atherosclerosis. No enlarged abdominal or pelvic lymph nodes. Reproductive: Status post hysterectomy. No adnexal masses. Other: No abdominal wall hernia or abnormality. No abdominopelvic  ascites. Musculoskeletal: No acute or significant osseous findings. Chronic changes about the right hip joint noted. IMPRESSION: CT of the chest: Emphysematous changes as well as findings of prior granulomatous disease. Some slight increased opacity is noted in the left upper lobe peripherally which may represent some very early inflammatory change. No focal confluent infiltrate is noted. CT of the abdomen and pelvis: Stable left adrenal lesion consistent with adenoma. No further follow-up is recommended. Punctate nonobstructing right renal stone. No other focal abnormality is noted. Electronically Signed   By: Oneil Devonshire M.D.   On: 08/01/2023 02:21   CT Head Wo Contrast Result Date: 08/01/2023 CLINICAL DATA:  Recent fall with headaches, initial encounter EXAM: CT HEAD WITHOUT CONTRAST TECHNIQUE: Contiguous axial images were obtained from the base of the skull through the vertex without intravenous contrast. RADIATION DOSE REDUCTION: This exam was performed according to the departmental dose-optimization program which includes automated exposure control, adjustment of the mA and/or kV according to patient size and/or use of iterative reconstruction technique. COMPARISON:  None Available. FINDINGS: Brain: No evidence of acute infarction, hemorrhage, hydrocephalus, extra-axial collection or mass lesion/mass effect. Some septum pellucidum is noted which is a normal variant. Mild atrophic changes and chronic white matter ischemic changes are seen. Vascular: No hyperdense vessel or unexpected calcification. Skull: Normal. Negative for fracture or focal lesion. Sinuses/Orbits: No acute finding. Other: None. IMPRESSION: Chronic atrophic and ischemic changes.  No acute abnormality noted. Electronically Signed   By: Oneil Devonshire M.D.   On: 08/01/2023 02:05   CT Maxillofacial Wo Contrast Result Date: 08/01/2023 CLINICAL DATA:  Recent fall with facial pain, initial encounter EXAM: CT MAXILLOFACIAL WITHOUT CONTRAST  TECHNIQUE: Multidetector CT imaging of the maxillofacial structures was performed. Multiplanar CT image reconstructions were also generated. RADIATION DOSE REDUCTION: This exam was performed according to the departmental dose-optimization program which includes automated exposure control, adjustment of the mA and/or kV according to patient size and/or use of iterative reconstruction technique. COMPARISON:  None Available. FINDINGS: Osseous: No acute fracture is identified. Orbits: Orbits and their contents are within normal limits. Sinuses: Paranasal sinuses are unremarkable. Soft tissues: Soft tissue structures show very mild soft tissue swelling in the left supraorbital region consistent with the recent injury. No other focal abnormality is noted. Limited intracranial: Within normal limits. IMPRESSION: No acute fracture is noted. Mild left supraorbital soft tissue swelling. Electronically Signed   By: Oneil Devonshire M.D.   On: 08/01/2023 02:04   CT Cervical Spine Wo Contrast Result Date: 08/01/2023 CLINICAL DATA:  Recent fall with neck pain, initial encounter EXAM: CT CERVICAL SPINE WITHOUT CONTRAST TECHNIQUE: Multidetector CT imaging of the cervical spine was performed without intravenous contrast. Multiplanar CT image reconstructions were also generated. RADIATION DOSE REDUCTION: This exam was performed according to the departmental dose-optimization program which includes automated exposure control, adjustment of the mA and/or kV according to patient size and/or use of iterative reconstruction technique. COMPARISON:  None Available. FINDINGS: Alignment: Mild loss of the normal cervical lordosis is noted. Skull base and vertebrae: 7 cervical segments are well visualized. Vertebral body height is well maintained. Mild osteophytic change and facet hypertrophic changes are noted. No acute fracture or acute facet abnormality is seen. Soft  tissues and spinal canal: Surrounding soft tissue structures are within  normal limits. Upper chest: Visualized lung apices demonstrate emphysematous changes. Other: None IMPRESSION: Mild degenerative change of the cervical spine without acute abnormality. Electronically Signed   By: Oneil Devonshire M.D.   On: 08/01/2023 02:02   DG Shoulder Left Result Date: 07/31/2023 CLINICAL DATA:  Fall EXAM: LEFT SHOULDER - 2+ VIEW COMPARISON:  None Available. FINDINGS: Moderate AC joint degenerative change. Mild glenohumeral degenerative change. No fracture or malalignment IMPRESSION: Degenerative changes. Electronically Signed   By: Luke Bun M.D.   On: 07/31/2023 23:27     Signature  -   Lavada Stank M.D on 08/02/2023 at 7:55 AM   -  To page go to [email or web address]

## 2023-08-02 NOTE — Telephone Encounter (Signed)
 Called patient to confirm appointment with Dr. Vicci on 08/06/2023 at 8:50 am, but was unable to leave voicemail- due to voicemail not setup.

## 2023-08-02 NOTE — Progress Notes (Signed)
 Late Assessment Note    08/01/23 1910  PT Visit Information  Last PT Received On 08/01/23  Assistance Needed +1  History of Present Illness Pt is a 68 y/o female admitted 7/16 after fall at home with a prolonged downtime.  Work up includes rhabdo and continues.  PMHx:  ASD, Breast CA, COPD, DM2, HTN,  Precautions  Precautions Fall  Recall of Precautions/Restrictions Intact  Home Living  Family/patient expects to be discharged to: Private residence  Living Arrangements Alone  Available Help at Discharge Family (Mom asked pt to come stay with her after d/c for a while)  Type of Home Apartment  Home Access Stairs to enter  Entrance Stairs-Number of Steps flight to 2nd floor apt./  Home Layout One level  Bathroom Shower/Tub Tub/shower unit  Horticulturist, commercial Yes  Home Equipment [place] - single point;Shower seat;BSC/3in1  Additional Comments no friends in the complex, no family close, but MOM is not far.  Prior Function  Prior Level of Function  Independent/Modified Independent (but struggled lately)  Mobility Comments used cane, fell alot  ADLs Comments independent per pt.  bus to get groceries.  Pain Assessment  Pain Assessment Faces  Faces Pain Scale 6  Pain Location L side > R side pain  Pain Descriptors / Indicators Burning;Discomfort;Moaning;Sore  Pain Intervention(s) Monitored during session;Limited activity within patient's tolerance  Cognition  Arousal Alert  Behavior During Therapy Flat affect  PT - Cognitive impairments No apparent impairments  Following Commands  Following commands Intact  Cueing  Cueing Techniques Verbal cues;Tactile cues  Communication  Communication No apparent difficulties  Factors Affecting Communication Reduced clarity of speech  Upper Extremity Assessment  Upper Extremity Assessment  (mild R side weakness,  L UE> L LE weakness and pain.)  Lower Extremity Assessment  Lower Extremity Assessment  (bil general  weakness and soreness L>R)  Cervical / Trunk Assessment  Cervical / Trunk Assessment Normal  Bed Mobility  Overal bed mobility Needs Assistance  Bed Mobility Supine to Sit;Sit to Supine  Supine to sit Mod assist  Sit to supine Mod assist  General bed mobility comments due to pain, pt needed light moderate truncal assist up and LE back in bed.  Transfers  Overall transfer level Needs assistance  Transfers Sit to/from Stand;Bed to chair/wheelchair/BSC  Sit to Stand Min assist  Bed to/from chair/wheelchair/BSC transfer type: Lateral/scoot transfer   Lateral/Scoot Transfers Min assist  General transfer comment steps toward [facility], weak and limited .  Ambulation/Gait  General Gait Details side steps only  Balance  Overall balance assessment Needs assistance  Sitting balance-Leahy Scale Fair  Standing balance support Single extremity supported;Bilateral upper extremity supported  Standing balance-Leahy Scale Poor  Standing balance comment reliant on the RW  PT - End of Session  Equipment Utilized During Treatment Gait belt  Activity Tolerance Patient tolerated treatment well  Patient left in bed;with call bell/phone within reach;with family/visitor present  Nurse Communication Mobility status  PT Assessment  PT Recommendation/Assessment Patient needs continued PT services  PT Visit Diagnosis Unsteadiness on feet (R26.81);Muscle weakness (generalized) (M62.81)  PT Problem List Decreased strength;Decreased activity tolerance;Decreased balance;Decreased mobility  PT Plan  PT Frequency (ACUTE ONLY) Min 2X/week  PT Treatment/Interventions (ACUTE ONLY) DME instruction;Gait training;Stair training;Functional mobility training;Therapeutic activities;Balance training;Patient/family education  AM-PAC PT 6 Clicks Mobility Outcome Measure (Version 2)  Help needed turning from your back to your side while in a flat bed without using bedrails? 3  Help needed moving from lying  on your back to sitting  on the side of a flat bed without using bedrails? 3  Help needed moving to and from a bed to a chair (including a wheelchair)? 3  Help needed standing up from a chair using your arms (e.g., wheelchair or bedside chair)? 3  Help needed to walk in hospital room? 2  Help needed climbing 3-5 steps with a railing?  2  6 Click Score 16  Consider Recommendation of Discharge To: Home with [facility]  Progressive Mobility  What is the highest level of mobility based on the progressive mobility assessment? Level 3 (Stands with assist) - Balance while standing  and cannot march in place  Mobility Referral Yes  Activity Stood at bedside;Transferred to/from [facility]  PT Recommendation  Follow Up Recommendations Home health PT  Patient can return home with the following A little help with walking and/or transfers;A little help with bathing/dressing/bathroom;Assistance with cooking/housework;Help with stairs or ramp for entrance  Functional Status Assessment Patient has had a recent decline in their functional status and demonstrates the ability to make significant improvements in function in a reasonable and predictable amount of time.  PT equipment None recommended by PT  Individuals Consulted  Consulted and Agree with Results and Recommendations Patient  Acute Rehab PT Goals  Patient Stated Goal go home  Time For Goal Achievement 08/15/23  Potential to Achieve Goals Good  PT Time Calculation  PT Start Time (ACUTE ONLY) 1721  PT Stop Time (ACUTE ONLY) 1758  PT Time Calculation (min) (ACUTE ONLY) 37 min  PT General Charges  $$ ACUTE PT VISIT 1 Visit   No Charge visit  08/02/2023  [place] HERO., PT Acute Rehabilitation Services [phone]  (office)

## 2023-08-02 NOTE — Plan of Care (Signed)

## 2023-08-02 NOTE — Evaluation (Signed)
 Occupational Therapy Evaluation Patient Details Name: [name] MRN: [id number] DOB: 1955-02-07 Today's Date: 08/02/2023   History of Present Illness   Pt is a 68 y/o female admitted 7/16 after fall at home with a prolonged downtime.  Work up includes rhabdo and continues.  PMHx:  ASD, Breast CA, COPD, DM2, HTN,     Clinical Impressions Pt reports walking with a cane and being independent in ADLs and IADLs. She does not drive. She reports frequent falls. She has a medical alert system, but it was not on her when she fell. Pt presents with generalized pain and weakness, impaired cognition and poor standing balance. She needs max assist for bed mobility and min assist with RW to stand and transfer. Patient will benefit from continued inpatient follow up therapy, <3 hours/day. Pt prefers to discharge her her elderly mother's home who cannot physically assist her.      If plan is discharge home, recommend the following:   A little help with walking and/or transfers;A lot of help with bathing/dressing/bathroom;Assistance with cooking/housework;Assist for transportation;Help with stairs or ramp for entrance     Functional Status Assessment   Patient has had a recent decline in their functional status and demonstrates the ability to make significant improvements in function in a reasonable and predictable amount of time.     Equipment Recommendations   BSC/3in1;Other (comment) (RW)     Recommendations for Other Services         Precautions/Restrictions   Precautions Precautions: Fall     Mobility Bed Mobility Overal bed mobility: Needs Assistance Bed Mobility: Rolling, Sidelying to Sit Rolling: Max assist Sidelying to sit: Max assist       General bed mobility comments: able to advance feet off EOB with increased time, otherwise dependent    Transfers Overall transfer level: Needs assistance Equipment used: Rolling walker (2 wheels) Transfers: Sit to/from  Stand, Bed to chair/wheelchair/BSC Sit to Stand: Min assist          Lateral/Scoot Transfers: Min assist General transfer comment: cues for hand placement, increased time, assist to rise and steady      Balance Overall balance assessment: Needs assistance   Sitting balance-Leahy Scale: Fair       Standing balance-Leahy Scale: Poor Standing balance comment: reliant on RW and min assist                           ADL either performed or assessed with clinical judgement   ADL Overall ADL's : Needs assistance/impaired Eating/Feeding: Independent;Sitting   Grooming: Wash/dry hands;Wash/dry face;Sitting;Set up   Upper Body Bathing: Minimal assistance;Sitting   Lower Body Bathing: Maximal assistance;Sit to/from stand   Upper Body Dressing : Minimal assistance;Sitting   Lower Body Dressing: Maximal assistance;Sit to/from stand   Toilet Transfer: Minimal assistance;Stand-pivot;BSC/3in1   Toileting- Clothing Manipulation and Hygiene: Total assistance;Sit to/from stand               Vision Baseline Vision/History: 1 Wears glasses Ability to See in Adequate Light: 0 Adequate Patient Visual Report: No change from baseline       Perception         Praxis         Pertinent Vitals/Pain Pain Assessment Pain Assessment: Faces Faces Pain Scale: Hurts little more Pain Location: generalized Pain Descriptors / Indicators: Grimacing, Guarding Pain Intervention(s): Premedicated before session, Monitored during session     Extremity/Trunk Assessment Upper Extremity Assessment Upper Extremity Assessment: Generalized  weakness;Right hand dominant   Lower Extremity Assessment Lower Extremity Assessment: Defer to PT evaluation   Cervical / Trunk Assessment Cervical / Trunk Assessment: Other exceptions (weakness)   Communication Communication Communication: Impaired Factors Affecting Communication: Other (comment) (low volume)   Cognition Arousal:  Alert Behavior During Therapy: Flat affect Cognition: Cognition impaired     Awareness: Intellectual awareness impaired, Online awareness impaired     Executive functioning impairment (select all impairments): Problem solving, Sequencing                   Following commands: Intact       Cueing  General Comments   Cueing Techniques: Verbal cues;Tactile cues      Exercises     Shoulder Instructions      Home Living Family/patient expects to be discharged to:: Private residence Living Arrangements: Alone Available Help at Discharge: Family (can stay with her 72 year old mom) Type of Home: Apartment Home Access: Stairs to enter Entrance Stairs-Number of Steps: flight to 2nd floor apt./   Home Layout: One level     Bathroom Shower/Tub: Chief Strategy Officer: Standard     Home Equipment: Cane - single point;Shower seat;BSC/3in1   Additional Comments: report staff in apartment complex got pt up out of the parking lot prior to her fall in her bathroom      Prior Functioning/Environment Prior Level of Function : Independent/Modified Independent             Mobility Comments: used cane, fell alot ADLs Comments: independent per pt.  bus to get groceries.    OT Problem List: Decreased strength;Impaired balance (sitting and/or standing);Decreased activity tolerance;Decreased cognition;Decreased safety awareness;Decreased knowledge of use of DME or AE;Pain   OT Treatment/Interventions: Self-care/ADL training;DME and/or AE instruction;Therapeutic activities;Cognitive remediation/compensation;Patient/family education;Balance training      OT Goals(Current goals can be found in the care plan section)   Acute Rehab OT Goals OT Goal Formulation: With patient Time For Goal Achievement: 08/16/23 Potential to Achieve Goals: Good ADL Goals Pt Will Perform Grooming: with supervision;standing Pt Will Perform Upper Body Dressing: with  set-up;sitting Pt Will Perform Lower Body Dressing: with min assist;sit to/from stand Pt Will Transfer to Toilet: with supervision;ambulating;bedside commode Pt Will Perform Toileting - Clothing Manipulation and hygiene: with min assist;sit to/from stand Additional ADL Goal #1: Pt will complete bed mobility with supervision in preparation for ADLs.   OT Frequency:  Min 2X/week    Co-evaluation              AM-PAC OT 6 Clicks Daily Activity     Outcome Measure Help from another person eating meals?: None Help from another person taking care of personal grooming?: A Little Help from another person toileting, which includes using toliet, bedpan, or urinal?: Total Help from another person bathing (including washing, rinsing, drying)?: A Lot Help from another person to put on and taking off regular upper body clothing?: A Little Help from another person to put on and taking off regular lower body clothing?: A Lot 6 Click Score: 15   End of Session Equipment Utilized During Treatment: Gait belt;Rolling walker (2 wheels)  Activity Tolerance: Patient tolerated treatment well Patient left: in chair;with call bell/phone within reach;with chair alarm set  OT Visit Diagnosis: Unsteadiness on feet (R26.81);Other abnormalities of gait and mobility (R26.89);Pain;Muscle weakness (generalized) (M62.81);Other symptoms and signs involving cognitive function                Time: 1005-1045 OT  Time Calculation (min): 40 min Charges:  OT General Charges $OT Visit: 1 Visit OT Evaluation $OT Eval Moderate Complexity: 1 Mod OT Treatments $Self Care/Home Management : 23-37 mins  [name], OTR/L Acute Rehabilitation Services Office: [phone]  [name] 08/02/2023, 10:58 AM

## 2023-08-03 DIAGNOSIS — M6282 Rhabdomyolysis: Secondary | ICD-10-CM | POA: Diagnosis not present

## 2023-08-03 LAB — CBC WITH DIFFERENTIAL/PLATELET
Abs Immature Granulocytes: 0.03 K/uL (ref 0.00–0.07)
Basophils Absolute: 0 K/uL (ref 0.0–0.1)
Basophils Relative: 1 %
Eosinophils Absolute: 0.1 K/uL (ref 0.0–0.5)
Eosinophils Relative: 2 %
HCT: 39 % (ref 36.0–46.0)
Hemoglobin: 12.7 g/dL (ref 12.0–15.0)
Immature Granulocytes: 1 %
Lymphocytes Relative: 28 %
Lymphs Abs: 1.7 K/uL (ref 0.7–4.0)
MCH: 30 pg (ref 26.0–34.0)
MCHC: 32.6 g/dL (ref 30.0–36.0)
MCV: 92.2 fL (ref 80.0–100.0)
Monocytes Absolute: 0.6 K/uL (ref 0.1–1.0)
Monocytes Relative: 9 %
Neutro Abs: 3.7 K/uL (ref 1.7–7.7)
Neutrophils Relative %: 59 %
Platelets: 164 K/uL (ref 150–400)
RBC: 4.23 MIL/uL (ref 3.87–5.11)
RDW: 13.2 % (ref 11.5–15.5)
WBC: 6.1 K/uL (ref 4.0–10.5)
nRBC: 0 % (ref 0.0–0.2)

## 2023-08-03 LAB — GLUCOSE, CAPILLARY
Glucose-Capillary: 114 mg/dL — ABNORMAL HIGH (ref 70–99)
Glucose-Capillary: 110 mg/dL — ABNORMAL HIGH (ref 70–99)
Glucose-Capillary: 152 mg/dL — ABNORMAL HIGH (ref 70–99)
Glucose-Capillary: 150 mg/dL — ABNORMAL HIGH (ref 70–99)
Glucose-Capillary: 139 mg/dL — ABNORMAL HIGH (ref 70–99)

## 2023-08-03 LAB — PROCALCITONIN: Procalcitonin: <0.1 ng/mL

## 2023-08-03 LAB — MAGNESIUM: Magnesium: 1.6 mg/dL — ABNORMAL LOW (ref 1.7–2.4)

## 2023-08-03 LAB — BRAIN NATRIURETIC PEPTIDE: B Natriuretic Peptide: 355.8 pg/mL — ABNORMAL HIGH (ref 0.0–100.0)

## 2023-08-03 MED ORDER — LISINOPRIL 5 MG PO TABS
5.0000 mg | ORAL_TABLET | Freq: Every day | ORAL | Status: DC
  Administered 2023-08-03 – 2023-08-04 (×2): 5 mg via ORAL
  Filled 2023-08-03 (×2): qty 1

## 2023-08-03 MED ORDER — MAGNESIUM SULFATE 4 GM/100ML IV SOLN
4.0000 g | Freq: Once | INTRAVENOUS | Status: AC
  Administered 2023-08-03: 4 g via INTRAVENOUS
  Filled 2023-08-03: qty 100

## 2023-08-03 NOTE — Plan of Care (Signed)
°  Problem: Education: °Goal: Knowledge of General Education information will improve °Description: Including pain rating scale, medication(s)/side effects and non-pharmacologic comfort measures °Outcome: Progressing °  °Problem: Clinical Measurements: °Goal: Cardiovascular complication will be avoided °Outcome: Progressing °  °Problem: Activity: °Goal: Risk for activity intolerance will decrease °Outcome: Progressing °  °

## 2023-08-03 NOTE — Progress Notes (Signed)
 Physical Therapy Treatment Patient Details Name: [name] MRN: [id number] DOB: [birth year] Today's Date: 08/03/2023   History of Present Illness Pt is a 68 y/o female admitted 7/16 after fall at home with a prolonged downtime.  Work up includes rhabdo and continues.  PMHx:  ASD, Breast CA, COPD, DM2, HTN,    PT Comments  Patient progressing with mobility, still requires CGA.  Patient running into items (chair, door frame) but able to figure out how to get around them.  I feel patient would benefit from more assistance/supervision at home for first 24-48 hours (maybe more than elderly mother).  Encouraged sister to spend more time there at discharge.      If plan is discharge home, recommend the following: Help with stairs or ramp for entrance;Assistance with cooking/housework;Assist for transportation;A little help with bathing/dressing/bathroom;A little help with walking and/or transfers   Can travel by private vehicle        Equipment Recommendations  None recommended by PT    Recommendations for Other Services       Precautions / Restrictions Precautions Precautions: Fall Recall of Precautions/Restrictions: Intact Restrictions Weight Bearing Restrictions Per Provider Order: No     Mobility  Bed Mobility               General bed mobility comments: sitting in recliner upon arrival    Transfers Overall transfer level: Needs assistance Equipment used: Rolling walker (2 wheels) Transfers: Sit to/from Stand Sit to Stand: Contact guard assist                Ambulation/Gait Ambulation/Gait assistance: Supervision Gait Distance (Feet): 40 Feet Assistive device: Rolling walker (2 wheels) Gait Pattern/deviations: Step-through pattern, Decreased stride length Gait velocity: decreased     General Gait Details: limited by shortness of breath.  Often running into things (chair, door frame, etc) but able to figure out how to get around.   Stairs              Wheelchair Mobility     Tilt Bed    Modified Rankin (Stroke Patients Only)       Balance Overall balance assessment: Needs assistance Sitting-balance support: No upper extremity supported, Feet supported Sitting balance-Leahy Scale: Good     Standing balance support: Bilateral upper extremity supported, During functional activity, Reliant on assistive device for balance   Standing balance comment: no overt LOB during gait                            Communication Communication Communication: No apparent difficulties  Cognition Arousal: Alert Behavior During Therapy: Flat affect   PT - Cognitive impairments: No apparent impairments                         Following commands: Intact      Cueing    Exercises      General Comments        Pertinent Vitals/Pain Pain Assessment Pain Assessment: Faces Faces Pain Scale: Hurts a little bit Pain Location: generalized Pain Intervention(s): Monitored during session    Home Living                          Prior Function            PT Goals (current goals can now be found in the care plan section) Progress towards PT goals: Progressing toward goals  Frequency    Min 2X/week      PT Plan      Co-evaluation              AM-PAC PT 6 Clicks Mobility   Outcome Measure  Help needed turning from your back to your side while in a flat bed without using bedrails?: A Little Help needed moving from lying on your back to sitting on the side of a flat bed without using bedrails?: A Little Help needed moving to and from a bed to a chair (including a wheelchair)?: A Little Help needed standing up from a chair using your arms (e.g., wheelchair or bedside chair)?: A Little Help needed to walk in hospital room?: A Little Help needed climbing 3-5 steps with a railing? : A Little 6 Click Score: 18    End of Session Equipment Utilized During Treatment: Gait belt Activity  Tolerance: Patient tolerated treatment well Patient left: in chair;with call bell/phone within reach;with chair alarm set;with family/visitor present   PT Visit Diagnosis: Unsteadiness on feet (R26.81);Muscle weakness (generalized) (M62.81)     Time: 8574-8560 PT Time Calculation (min) (ACUTE ONLY): 14 min  Charges:    $Gait Training: 8-22 mins PT General Charges $$ ACUTE PT VISIT: 1 Visit                     08/03/2023 [place], PT Acute Rehabilitation Services Office:  [phone]    Katharina Venus HERO 08/03/2023, 2:50 PM

## 2023-08-03 NOTE — Plan of Care (Signed)
  Problem: Coping: Goal: Ability to adjust to condition or change in health will improve Outcome: Progressing   Problem: Clinical Measurements: Goal: Will remain free from infection Outcome: Progressing   Problem: Activity: Goal: Risk for activity intolerance will decrease Outcome: Progressing   Problem: Pain Managment: Goal: General experience of comfort will improve and/or be controlled Outcome: Progressing   Problem: Safety: Goal: Ability to remain free from injury will improve Outcome: Progressing

## 2023-08-03 NOTE — Progress Notes (Signed)
 PROGRESS NOTE                                                                                                                                                                                                             Patient Demographics:    [name], is a 68 y.o. female, DOB - [birth year], FMW:[id number]  Outpatient Primary MD for the patient is Vicci Barnie NOVAK, MD    LOS - 1  Admit date - 07/31/2023    No chief complaint on file.      Brief Narrative (HPI from H&P)   68 y.o. year old female with past medical history of essential hypertension, CAD, DM type II, COPD, hyperlipidemia, generalized anxiety disorder, Sleep apnea, sarcoidosis in remission, constipation, and chronic muscle spasms. 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 She presents to [facility] ED after fall at home with a prolonged down time in which she was unable to get up, family had not made contact with her in a little over a day. Per family she has frequent falls and is often unable to get up without assistance.  Ms. Canby reports she has had intermittent falls since 2022 and attributes them to weakness. She does state that sometimes she blacks out with these falls, though she denies blacking prior to this most recent fall.  Denies dizziness, palpitations, chest pain, dyspnea, or change in sensorium preceding the falls.    Subjective:   Patient in bed, appears comfortable, denies any headache, no fever, no chest pain or pressure, no shortness of breath , no abdominal pain. No focal weakness.   Assessment  & Plan :    Dehydration, generalized weakness, recurrent falls with rhabdomyolysis, hypokalemia and hypomagnesemia, in a patient with recent diagnosis of breast  cancer.   Presentation is most consistent with dehydration causing generalized weakness and possible orthostatic hypotension, electrolytes will be replaced, since she has recent diagnosis of breast cancer MRI brain was done which did not show any acute changes or metastatic disease, does show some chronic microvascular disease for which she is already on aspirin  and statin at home which will be continued, no headache or focal deficits.  CT head and C-spine negative.  Echocardiogram stable.  Continue hydration with IV fluids, continue replacement of electrolytes, await orthostatic blood pressure check, continue PT OT, patient is elderly and lives by herself.  Once symptoms are better will be discharged safely hopefully to home versus SNF.  Monitor CK levels which are improving.  Likely discharge on 08/04/2023 with home health PT, patient is going to get some help from a 44 year old mother upon being discharged.    #Chronic hypoxic respiratory failure on 2-3 L [place] with exertion and at night with underlying history of COPD  - No signs of exacerbation, no shortness of breath, no oxygen  need, no wheezing, continue home Trelegy Ellipta  and breathing treatments.   #Hypertension -Continue home Coreg , add hydralazine  oral scheduled along with as needed IV for better control.   #Coronary Artery Disease - no chest pain, EKG with LVH phenomenon, echocardiogram with preserved EF and no wall motion abnormality, continue aspirin , beta-blocker and statin.  #Hyperlipidemia - unclear if she takes statin at home, LDL below 70 recheck home medication list   #OSA Previously on CPAP nightly, has not used since moving to [place] from [place]  in 2022, no acute issues outpatient follow-up with PCP and pulmonary as needed.   #Depression, Generalized anxiety disorder -Continue home Prozac    # Type 2 diabetes mellitus - On metformin  outpatient, - ACHS SSI and CBG monitoring while she is inpatient, holding metformin .  Lab Results   Component Value Date   HGBA1C 6.2 (H) 06/05/2023    CBG (last 3)  Recent Labs    08/02/23 1640 08/02/23 2041 08/03/23 0732  GLUCAP 123* 135* 114*         Condition - Fair  Family Communication  :  None present  Code Status :   Full  Consults  :  None  PUD Prophylaxis : PPI   Procedures  :           Disposition Plan  :    Status is: inpt   DVT Prophylaxis  :    heparin  injection 5,000 Units Start: 08/02/23 0845 Place and maintain sequential compression device Start: 08/01/23 0816   Lab Results  Component Value Date   PLT 164 08/03/2023    Diet :  Diet Order             Diet heart healthy/carb modified Room service appropriate? Yes; Fluid consistency: Thin  Diet effective now                    Inpatient Medications  Scheduled Meds:  aspirin   81 mg Oral Daily   bacitracin    Topical Daily   carvedilol   12.5 mg Oral BID WC   FLUoxetine   40 mg Oral Daily   heparin  injection (subcutaneous)  5,000 Units Subcutaneous Q8H   hydrALAZINE   50 mg Oral Q8H   insulin  aspart  0-5 Units Subcutaneous QHS   insulin  aspart  0-9 Units Subcutaneous TID WC   lidocaine   2 patch Transdermal Q24H   linaclotide   290 mcg Oral QAC breakfast   lisinopril   5 mg Oral Daily   pantoprazole   40 mg Oral Daily   Continuous Infusions:  magnesium  sulfate bolus IVPB     PRN Meds:.acetaminophen , albuterol , hydrALAZINE , ketorolac , melatonin, methocarbamol , nitroGLYCERIN , ondansetron  (ZOFRAN ) IV, polyethylene glycol, traMADol   Antibiotics  :    Anti-infectives (From admission, onward)    None         Objective:   Vitals:   08/02/23 2118 08/02/23 2311  08/03/23 0527 08/03/23 0727  BP: (!) 149/80 (!) 140/92 (!) 159/79 (!) 163/82  Pulse:  65 68   Resp:  18 14   Temp:  97.9 F (36.6 C) 98.2 F (36.8 C) 98.7 F (37.1 C)  TempSrc:  Oral Oral Oral  SpO2:  94% 97%   Weight:      Height:        Wt Readings from Last 3 Encounters:  07/31/23 84.8 kg  07/10/23  82.4 kg  06/11/23 83 kg     Intake/Output Summary (Last 24 hours) at 08/03/2023 0738 Last data filed at 08/03/2023 0534 Gross per 24 hour  Intake --  Output 1600 ml  Net -1600 ml     Physical Exam  Awake Alert, No new F.N deficits, Normal affect [place].AT,PERRAL Supple Neck, No JVD,   Symmetrical Chest wall movement, Good air movement bilaterally, CTAB RRR,No Gallops,Rubs or new Murmurs,  +ve B.Sounds, Abd Soft, No tenderness,   No Cyanosis, Clubbing or edema      Data Review:    Recent Labs  Lab 07/31/23 2340 08/01/23 0558 08/02/23 0520 08/03/23 0538  WBC 7.9 6.9 7.1 6.1  HGB 15.1* 14.3 13.8 12.7  HCT 44.4 42.7 42.7 39.0  PLT 215 210 165 164  MCV 87.7 89.1 91.2 92.2  MCH 29.8 29.9 29.5 30.0  MCHC 34.0 33.5 32.3 32.6  RDW 12.7 12.8 13.2 13.2  LYMPHSABS 1.0  --  1.6 1.7  MONOABS 0.4  --  0.6 0.6  EOSABS 0.0  --  0.1 0.1  BASOSABS 0.0  --  0.0 0.0    Recent Labs  Lab 07/31/23 2340 08/01/23 0558 08/02/23 0520 08/03/23 0538  NA 138 139 139  --   K 3.2* 3.2* 3.8  --   CL 101 106 108  --   CO2 22 22 21*  --   ANIONGAP 15 11 10   --   GLUCOSE 130* 104* 111*  --   BUN 10 8 8   --   CREATININE 0.66 0.39* 0.60  --   AST 85*  --   --   --   ALT 35  --   --   --   ALKPHOS 48  --   --   --   BILITOT 1.1  --   --   --   ALBUMIN 3.6  --   --   --   PROCALCITON  --   --  <0.10  --   LATICACIDVEN  --  1.0  --   --   BNP  --   --  162.4* 355.8*  MG  --  1.3* 1.4* 1.6*  PHOS  --  2.5  --   --   CALCIUM  11.3* 10.4* 10.0  --       Recent Labs  Lab 07/31/23 2340 08/01/23 0558 08/02/23 0520 08/03/23 0538  PROCALCITON  --   --  <0.10  --   LATICACIDVEN  --  1.0  --   --   BNP  --   --  162.4* 355.8*  MG  --  1.3* 1.4* 1.6*  CALCIUM  11.3* 10.4* 10.0  --     --------------------------------------------------------------------------------------------------------------- Lab Results  Component Value Date   CHOL 108 08/02/2023   HDL 41 08/02/2023   LDLCALC 57  08/02/2023   TRIG 49 08/02/2023   CHOLHDL 2.6 08/02/2023    Lab Results  Component Value Date   HGBA1C 6.2 (H) 06/05/2023     Micro Results No results found  for this or any previous visit (from the past 240 hours).  Radiology Report ECHOCARDIOGRAM COMPLETE Result Date: 08/01/2023    ECHOCARDIOGRAM REPORT   Patient Name:   [name] Date of Exam: 08/01/2023 Medical Rec #:  [id number]     Height:       67.0 in Accession #:    [id number]    Weight:       187.0 lb Date of Birth:  1955-02-07     BSA:          1.965 m Patient Age:    68 years      BP:           154/86 mmHg Patient Gender: F             HR:           67 bpm. Exam Location:  Inpatient Procedure: 2D Echo, Cardiac Doppler and Color Doppler (Both Spectral and Color            Flow Doppler were utilized during procedure). Indications:    CHF  History:        Patient has no prior history of Echocardiogram examinations.  Sonographer:    Therisa Crouch Referring Phys: JACQUELINE Kaylum Shrum K [facility] IMPRESSIONS  1. Left ventricular ejection fraction, by estimation, is 60 to 65%. Left ventricular ejection fraction by 2D MOD biplane is 66.3 %. The left ventricle has normal function. The left ventricle has no regional wall motion abnormalities. Left ventricular diastolic parameters were normal. The average left ventricular global longitudinal strain is -20.2 %. The global longitudinal strain is normal.  2. Right ventricular systolic function is normal. The right ventricular size is normal.  3. There is no evidence of cardiac tamponade.  4. The mitral valve is normal in structure. No evidence of mitral valve regurgitation. No evidence of mitral stenosis.  5. The aortic valve is normal in structure. Aortic valve regurgitation is not visualized. No aortic stenosis is present.  6. The inferior vena cava is normal in size with greater than 50% respiratory variability, suggesting right atrial pressure of 3 mmHg. FINDINGS  Left Ventricle: Left ventricular ejection  fraction, by estimation, is 60 to 65%. Left ventricular ejection fraction by 2D MOD biplane is 66.3 %. The left ventricle has normal function. The left ventricle has no regional wall motion abnormalities. The average left ventricular global longitudinal strain is -20.2 %. Strain was performed and the global longitudinal strain is normal. The left ventricular internal cavity size was normal in size. There is no left ventricular hypertrophy. Left ventricular diastolic parameters were normal. Right Ventricle: The right ventricular size is normal. No increase in right ventricular wall thickness. Right ventricular systolic function is normal. Left Atrium: Left atrial size was normal in size. Right Atrium: Right atrial size was normal in size. Pericardium: Trivial pericardial effusion is present. There is no evidence of cardiac tamponade. Mitral Valve: The mitral valve is normal in structure. No evidence of mitral valve regurgitation. No evidence of mitral valve stenosis. Tricuspid Valve: The tricuspid valve is normal in structure. Tricuspid valve regurgitation is not demonstrated. No evidence of tricuspid stenosis. Aortic Valve: The aortic valve is normal in structure. Aortic valve regurgitation is not visualized. No aortic stenosis is present. Pulmonic Valve: The pulmonic valve was normal in structure. Pulmonic valve regurgitation is not visualized. No evidence of pulmonic stenosis. Aorta: The aortic root is normal in size and structure. Venous: The inferior vena cava is normal in size with greater than  50% respiratory variability, suggesting right atrial pressure of 3 mmHg. IAS/Shunts: No atrial level shunt detected by color flow Doppler.  LEFT VENTRICLE PLAX 2D                        Biplane EF (MOD) LVIDd:         4.20 cm         LV Biplane EF:   Left LVIDs:         2.80 cm                          ventricular LV PW:         0.70 cm                          ejection LV IVS:        0.80 cm                           fraction by LVOT diam:     2.00 cm                          2D MOD LVOT Area:     3.14 cm                         biplane is                                                 66.3 %.  LV Volumes (MOD)               Diastology LV vol d, MOD    49.8 ml       LV e' medial:    5.22 cm/s A2C:                           LV E/e' medial:  13.9 LV vol d, MOD    76.8 ml       LV e' lateral:   10.10 cm/s A4C:                           LV E/e' lateral: 7.2 LV vol s, MOD    17.0 ml A2C:                           2D Longitudinal LV vol s, MOD    26.4 ml       Strain A4C:                           2D Strain GLS   -20.2 % LV SV MOD A2C:   32.8 ml       Avg: LV SV MOD A4C:   76.8 ml LV SV MOD BP:    42.4 ml RIGHT VENTRICLE             IVC RV Basal diam:  2.80 cm     IVC diam: 1.30 cm RV S prime:     11.00 cm/s TAPSE (M-mode): 2.4 cm LEFT ATRIUM  Index LA diam:        3.70 cm 1.88 cm/m LA Vol (A2C):   44.7 ml 22.74 ml/m LA Vol (A4C):   51.4 ml 26.15 ml/m LA Biplane Vol: 48.5 ml 24.68 ml/m   AORTA Ao Root diam: 2.70 cm MITRAL VALVE MV Area (PHT): 3.93 cm    SHUNTS MV Decel Time: 193 msec    Systemic Diam: 2.00 cm MV E velocity: 72.80 cm/s MV A velocity: 74.90 cm/s MV E/A ratio:  0.97 Morene Brownie Electronically signed by Morene Brownie Signature Date/Time: 08/01/2023/6:51:26 PM    Final    MR BRAIN WO CONTRAST Result Date: 08/01/2023 CLINICAL DATA:  68 year old female with recent fall. Neurologic deficit. EXAM: MRI HEAD WITHOUT CONTRAST TECHNIQUE: Multiplanar, multiecho pulse sequences of the brain and surrounding structures were obtained without intravenous contrast. COMPARISON:  Head CT 0152 hours today. FINDINGS: Brain: No restricted diffusion or evidence of acute infarction. Numerous chronic microhemorrhages in the brain, although concentrated in the deep gray nuclei, the brainstem, deep cerebellum. Less pronounced scattered cerebral hemisphere involvement, superior convexities of both hemispheres spared.  Superimposed numerous chronic lacunar infarcts in the bilateral deep gray matter nuclei. Chronic lacunar infarcts in the bilateral pons. Moderate T2 and FLAIR hyperintensity, and pronounced volume loss of the corpus callosum with severe chronic encephalomalacia in the central body of the corpus series 4, image 13. No superimposed cortical encephalomalacia identified. Cavum septum pellucidum, normal variant. No midline shift, mass effect, evidence of mass lesion, ventriculomegaly, extra-axial collection or acute intracranial hemorrhage. Cervicomedullary junction and pituitary are within normal limits. Vascular: Major intracranial vascular flow voids are preserved. Generalized intracranial artery tortuosity. Skull and upper cervical spine: Negative for age visible cervical spine. Visualized bone marrow signal is within normal limits. Sinuses/Orbits: Mildly Disconjugate gaze. Otherwise negative orbits and sinuses. Other: Visible internal auditory structures appear normal. Negative visible scalp and face. IMPRESSION: 1. No acute intracranial abnormality. 2. Severe chronic changes in the brain, including atrophied corpus callosum, numerous chronic microhemorrhages, chronic lacunar infarcts throughout the deep gray nuclei and brainstem. Constellation of findings strongly favors advanced chronic small vessel disease over other considerations (such as chronic demyelinating disease). The appearance is not convincing for amyloid angiopathy, although that entity would be difficult to exclude. Electronically Signed   By: VEAR Hurst M.D.   On: 08/01/2023 12:37     Signature  -   Lavada Stank M.D on 08/03/2023 at 7:38 AM   -  To page go to [email or web address]

## 2023-08-04 DIAGNOSIS — M6282 Rhabdomyolysis: Secondary | ICD-10-CM | POA: Diagnosis not present

## 2023-08-04 LAB — BASIC METABOLIC PANEL WITH GFR
Anion gap: 8 (ref 5–15)
BUN: 12 mg/dL (ref 8–23)
CO2: 24 mmol/L (ref 22–32)
Calcium: 10.1 mg/dL (ref 8.9–10.3)
Chloride: 106 mmol/L (ref 98–111)
Creatinine, Ser: 0.61 mg/dL (ref 0.44–1.00)
GFR, Estimated: >60 mL/min (ref >60)
Glucose, Bld: 110 mg/dL — ABNORMAL HIGH (ref 70–99)
Potassium: 3.6 mmol/L (ref 3.5–5.1)
Sodium: 138 mmol/L (ref 135–145)

## 2023-08-04 LAB — GLUCOSE, CAPILLARY
Glucose-Capillary: 146 mg/dL — ABNORMAL HIGH (ref 70–99)
Glucose-Capillary: 115 mg/dL — ABNORMAL HIGH (ref 70–99)
Glucose-Capillary: 117 mg/dL — ABNORMAL HIGH (ref 70–99)
Glucose-Capillary: 173 mg/dL — ABNORMAL HIGH (ref 70–99)

## 2023-08-04 LAB — MAGNESIUM: Magnesium: 1.6 mg/dL — ABNORMAL LOW (ref 1.7–2.4)

## 2023-08-04 LAB — CK: Total CK: 535 U/L — ABNORMAL HIGH (ref 38–234)

## 2023-08-04 MED ORDER — MAGNESIUM SULFATE 4 GM/100ML IV SOLN
4.0000 g | Freq: Once | INTRAVENOUS | Status: AC
  Administered 2023-08-04: 4 g via INTRAVENOUS
  Filled 2023-08-04: qty 100

## 2023-08-04 NOTE — TOC Initial Note (Addendum)
 Transition of Care [facility]) - Initial/Assessment Note    Patient Details  Name: [name] MRN: [id number] Date of Birth: October 08, 1955  [facility]) CM/SW Contact:    Marval Gell, RN Phone Number: 08/04/2023, 8:17 AM  Clinical Narrative:                  Plan for paient to DC to home when medically optimized. HH set up with [facility] and RW to be delivered to the room through [facility] with patient later this morning and she does not want HH services, family member at bedside agreeable to no [facility] services. They found walker at home and [employer] notified that they will not need it   Expected Discharge Plan: Home w Home Health Services Barriers to Discharge: Continued Medical Work up   Patient Goals and CMS Choice Patient states their goals for this hospitalization and ongoing recovery are:: to go home          Expected Discharge Plan and Services   Discharge Planning Services: CM Consult Post Acute Care Choice: Durable Medical Equipment, Home Health Living arrangements for the past 2 months: Apartment                           HH Arranged: PT, RN HH Agency: [facility] Date [facility] Agency Contacted: 08/04/23 Time HH Agency Contacted: [phone] Representative spoke with at [facility] Agency: Darleene  Prior Living Arrangements/Services Living arrangements for the past 2 months: Apartment                     Activities of Daily Living   ADL Screening (condition at time of admission) Independently performs ADLs?: Yes (appropriate for developmental age) Is the patient deaf or have difficulty hearing?: No Does the patient have difficulty seeing, even when wearing glasses/contacts?: Yes Does the patient have difficulty concentrating, remembering, or making decisions?: No  Permission Sought/Granted                  Emotional Assessment              Admission diagnosis:  Hypercalcemia [E83.52] Rhabdomyolysis [M62.82] Hypokalemia [E87.6] Elevated  AST (SGOT) [R74.01] Fall in home, initial encounter [W19.XXXA, Y92.009] Traumatic rhabdomyolysis, initial encounter (HCC) [T79.6XXA] 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/02/2023   Hypokalemia 04/02/2023   History of COPD 04/02/2023   Irritable bowel syndrome 04/02/2023   Malignant neoplasm of lower-outer quadrant of left breast of female, estrogen receptor positive (HCC) 03/15/2023   Invasive ductal carcinoma of breast, left (HCC) 03/13/2023   Osteopenia after menopause 02/27/2023   Nondisplaced fracture of proximal phalanx of left ring finger 07/25/2021   Displaced fracture of proximal phalanx of left little finger, initial encounter for closed fracture 07/07/2021   Onychomycosis of toenail 03/22/2021   Dry skin dermatitis 03/22/2021   Non-insulin  dependent type 2 diabetes mellitus (HCC) 02/18/2021   COPD, moderate (HCC) 11/08/2020   Depression 11/08/2020   Hyperlipidemia 11/08/2020   Mild sleep apnea 10/17/2020   Centrilobular emphysema (HCC) 06/26/2014   Benign essential HTN 06/26/2014   Dyspnea 06/25/2014  PCP:  Vicci Barnie NOVAK, MD Pharmacy:   [facility] [place], [place] - [address] [place] [address] Phone: [phone] Fax: [phone]  [facility] [address] [place], [place] - [address] [place] [address] Phone: [phone] Fax: [phone]  [email or web address] [place], [place] - [address] [facility] [address] [place] [address] Phone: [phone] Fax: [phone]  [facility] LONG - [facility] [address] [place] [address] Phone: [phone] Fax: [phone]     Social Drivers of Health (SDOH) Social History: SDOH Screenings   Food Insecurity: No Food Insecurity (08/02/2023)  Housing: Low Risk  (08/02/2023)  Transportation Needs: Unmet Transportation Needs (08/02/2023)  Utilities: Not At Risk (08/02/2023)  Alcohol Screen: Low Risk  (12/31/2022)  Depression (PHQ2-9): High Risk (04/11/2023)  Financial Resource Strain: Low Risk  (12/31/2022)  Physical Activity: Inactive (12/31/2022)  Social Connections: Moderately Isolated (08/02/2023)  Stress: No Stress Concern Present (12/31/2022)  Tobacco Use: Medium Risk (07/10/2023)  Health Literacy: Inadequate Health Literacy (12/31/2022)   SDOH Interventions:     Readmission Risk Interventions     No data to display

## 2023-08-04 NOTE — Progress Notes (Signed)
 PROGRESS NOTE                                                                                                                                                                                                             Patient Demographics:    [name], is a 68 y.o. female, DOB - 02/09/1955, FMW:[id number]  Outpatient Primary MD for the patient is Vicci Barnie NOVAK, MD    LOS - 2  Admit date - 07/31/2023    No chief complaint on file.      Brief Narrative (HPI from H&P)   68 y.o. year old female with past medical history of essential hypertension, CAD, DM type II, COPD, hyperlipidemia, generalized anxiety disorder, Sleep apnea, sarcoidosis in remission, constipation, and chronic muscle spasms. 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 She presents to [facility] ED after fall at home with a prolonged down time in which she was unable to get up, family had not made contact with her in a little over a day. Per family she has frequent falls and is often unable to get up without assistance.  Ms. Rumore reports she has had intermittent falls since 2022 and attributes them to weakness. She does state that sometimes she blacks out with these falls, though she denies blacking prior to this most recent fall.  Denies dizziness, palpitations, chest pain, dyspnea, or change in sensorium preceding the falls.    Subjective:   Patient in bed, appears comfortable, denies any headache, no fever, no chest pain or pressure, no shortness of breath , no abdominal pain. No focal weakness.   Assessment  & Plan :    Dehydration, generalized weakness, recurrent falls with rhabdomyolysis, hypokalemia and hypomagnesemia, in a patient with recent diagnosis of breast  cancer.   Presentation is most consistent with dehydration causing generalized weakness and possible orthostatic hypotension, electrolytes will be replaced, since she has recent diagnosis of breast cancer MRI brain was done which did not show any acute changes or metastatic disease, does show some chronic microvascular disease for which she is already on aspirin  and statin at home which will be continued, no headache or focal deficits.  CT head and C-spine negative.  Echocardiogram stable.  Continue hydration with IV fluids, continue replacement of electrolytes, await orthostatic blood pressure check, continue PT OT, patient is elderly and lives by herself.  Once symptoms are better will be discharged safely hopefully to home versus SNF.  CK levels trending down.  Likely discharge on 08/04/2023 with home health PT, patient is going to get some help from a 34 year old mother upon being discharged.    #Chronic hypoxic respiratory failure on 2-3 L [place] with exertion and at night with underlying history of COPD  - No signs of exacerbation, no shortness of breath, no oxygen  need, no wheezing, continue home Trelegy Ellipta  and breathing treatments.   #Hypertension -Continue home Coreg , add hydralazine  oral scheduled along with as needed IV for better control.   #Coronary Artery Disease - no chest pain, EKG with LVH phenomenon, echocardiogram with preserved EF and no wall motion abnormality, continue aspirin , beta-blocker and statin.  #Hyperlipidemia - LDL is below 70 and she is currently not taking statin at home   #OSA Previously on CPAP nightly, has not used since moving to [place] from [place]  in 2022, no acute issues outpatient follow-up with PCP and pulmonary as needed.   #Depression, Generalized anxiety disorder -Continue home Prozac    # Hypomagnesemia.  Persistent despite aggressive replacement, give 8 g of IV magnesium  today, DC PPI  Type 2 diabetes mellitus - On metformin  outpatient, - ACHS SSI and CBG  monitoring while she is inpatient, holding metformin .  Lab Results  Component Value Date   HGBA1C 6.2 (H) 06/05/2023    CBG (last 3)  Recent Labs    08/03/23 1641 08/03/23 2147 08/04/23 0841  GLUCAP 150* 139* 146*         Condition - Fair  Family Communication  :  None present  Code Status :   Full  Consults  :  None  PUD Prophylaxis : PPI   Procedures  :           Disposition Plan  :    Status is: inpt   DVT Prophylaxis  :    heparin  injection 5,000 Units Start: 08/02/23 0845 Place and maintain sequential compression device Start: 08/01/23 0816   Lab Results  Component Value Date   PLT 164 08/03/2023    Diet :  Diet Order             Diet heart healthy/carb modified Room service appropriate? Yes; Fluid consistency: Thin  Diet effective now                    Inpatient Medications  Scheduled Meds:  aspirin   81 mg Oral Daily   bacitracin    Topical Daily   carvedilol   12.5 mg Oral BID WC   FLUoxetine   40 mg Oral Daily   heparin  injection (subcutaneous)  5,000 Units Subcutaneous Q8H   hydrALAZINE   50 mg Oral Q8H   insulin  aspart  0-5 Units Subcutaneous QHS   insulin  aspart  0-9 Units Subcutaneous TID WC   lidocaine   2 patch Transdermal Q24H   linaclotide   290 mcg Oral QAC breakfast   lisinopril   5 mg Oral Daily   Continuous Infusions:  magnesium  sulfate bolus IVPB     magnesium  sulfate bolus IVPB     PRN Meds:.acetaminophen , albuterol , hydrALAZINE , ketorolac , melatonin, methocarbamol , nitroGLYCERIN , ondansetron  (ZOFRAN ) IV, polyethylene glycol, traMADol   Antibiotics  :    Anti-infectives (From admission, onward)    None  Objective:   Vitals:   08/03/23 2042 08/03/23 2300 08/04/23 0504 08/04/23 0839  BP: (!) 150/74 (!) 151/73 (!) 159/87 (!) 150/74  Pulse:  68 60 72  Resp:  20 20 20   Temp:  98.7 F (37.1 C) 98.6 F (37 C) 99.5 F (37.5 C)  TempSrc:  Oral Oral Oral  SpO2:  93% 96%   Weight:      Height:         Wt Readings from Last 3 Encounters:  07/31/23 84.8 kg  07/10/23 82.4 kg  06/11/23 83 kg     Intake/Output Summary (Last 24 hours) at 08/04/2023 0937 Last data filed at 08/04/2023 0514 Gross per 24 hour  Intake --  Output 900 ml  Net -900 ml     Physical Exam  Awake Alert, No new F.N deficits, Normal affect [place].AT,PERRAL Supple Neck, No JVD,   Symmetrical Chest wall movement, Good air movement bilaterally, CTAB RRR,No Gallops,Rubs or new Murmurs,  +ve B.Sounds, Abd Soft, No tenderness,   No Cyanosis, Clubbing or edema      Data Review:    Recent Labs  Lab 07/31/23 2340 08/01/23 0558 08/02/23 0520 08/03/23 0538  WBC 7.9 6.9 7.1 6.1  HGB 15.1* 14.3 13.8 12.7  HCT 44.4 42.7 42.7 39.0  PLT 215 210 165 164  MCV 87.7 89.1 91.2 92.2  MCH 29.8 29.9 29.5 30.0  MCHC 34.0 33.5 32.3 32.6  RDW 12.7 12.8 13.2 13.2  LYMPHSABS 1.0  --  1.6 1.7  MONOABS 0.4  --  0.6 0.6  EOSABS 0.0  --  0.1 0.1  BASOSABS 0.0  --  0.0 0.0    Recent Labs  Lab 07/31/23 2340 08/01/23 0558 08/02/23 0520 08/03/23 0538 08/04/23 0648  NA 138 139 139  --  138  K 3.2* 3.2* 3.8  --  3.6  CL 101 106 108  --  106  CO2 22 22 21*  --  24  ANIONGAP 15 11 10   --  8  GLUCOSE 130* 104* 111*  --  110*  BUN 10 8 8   --  12  CREATININE 0.66 0.39* 0.60  --  0.61  AST 85*  --   --   --   --   ALT 35  --   --   --   --   ALKPHOS 48  --   --   --   --   BILITOT 1.1  --   --   --   --   ALBUMIN 3.6  --   --   --   --   PROCALCITON  --   --  <0.10 <0.10  --   LATICACIDVEN  --  1.0  --   --   --   BNP  --   --  162.4* 355.8*  --   MG  --  1.3* 1.4* 1.6* 1.6*  PHOS  --  2.5  --   --   --   CALCIUM  11.3* 10.4* 10.0  --  10.1      Recent Labs  Lab 07/31/23 2340 08/01/23 0558 08/02/23 0520 08/03/23 0538 08/04/23 0648  PROCALCITON  --   --  <0.10 <0.10  --   LATICACIDVEN  --  1.0  --   --   --   BNP  --   --  162.4* 355.8*  --   MG  --  1.3* 1.4* 1.6* 1.6*  CALCIUM  11.3* 10.4* 10.0  --  10.1     ---------------------------------------------------------------------------------------------------------------  Lab Results  Component Value Date   CHOL 108 08/02/2023   HDL 41 08/02/2023   LDLCALC 57 08/02/2023   TRIG 49 08/02/2023   CHOLHDL 2.6 08/02/2023    Lab Results  Component Value Date   HGBA1C 6.2 (H) 06/05/2023     Signature  -   Lavada Stank M.D on 08/04/2023 at 9:37 AM   -  To page go to [email or web address]

## 2023-08-04 NOTE — Progress Notes (Signed)
 Physical Therapy Treatment Patient Details Name: [name] MRN: [id number] DOB: 01-25-1955 Today's Date: 08/04/2023   History of Present Illness Pt is a 68 y/o female admitted 7/16 after fall at home with a prolonged downtime.  Work up includes rhabdo and continues.  PMHx:  ASD, Breast CA, COPD, DM2, HTN,    PT Comments  PT visited pt to perform stair training prior to discharge. Pt's mother in the room and reports that pt will be coming home with her and that she has a level entry. Discussed with pt her refusal of HHPT, pt reports it is cost prohibitive. Asked about OP PT. Pt is not interested says she will not be able to do in in addition to her radiation therapy. PT encouraged pt to have a home exercise program to complete when she wakes up before she goes to radiation. Pt reports she might be able to do that. Agreeable to getting up with therapy to work on. PT pulled back covers to assist pt to side of bed when pt found to be in a pool of liquid diarrhea. Pt called front desk for help getting cleaned up. PT provided modA for rolling pt on her side for pericare. While cleaning pt up she continued stooling. When stooling subsided PT cleaned pt up and provided pt with clean gown. Pt no longer agreeable to get up with therapy. Encouraged pt to try to keep active, so as not to loose her independence. PT will continue to follow if pt does not discharge today.     If plan is discharge home, recommend the following: Help with stairs or ramp for entrance;Assistance with cooking/housework;Assist for transportation;A little help with bathing/dressing/bathroom;A little help with walking and/or transfers   Can travel by private vehicle      Yes  Equipment Recommendations  None recommended by PT       Precautions / Restrictions Precautions Precautions: Fall Recall of Precautions/Restrictions: Intact Restrictions Weight Bearing Restrictions Per Provider Order: No     Mobility  Bed  Mobility Overal bed mobility: Needs Assistance Bed Mobility: Rolling Rolling: Min assist         General bed mobility comments: min A for rolling L and R for pericare           Communication Communication Communication: No apparent difficulties Factors Affecting Communication: Other (comment) (low volume)  Cognition Arousal: Alert Behavior During Therapy: Flat affect   PT - Cognitive impairments: No apparent impairments                         Following commands: Intact      Cueing Cueing Techniques: Verbal cues, Tactile cues     General Comments General comments (skin integrity, edema, etc.): pt found to be in pool of liquid diarrhea when attempted to get pt up to work on mobility, pt called front desk for assistance, in meantime PT performed pericare, however pt with constant diarrhea, pt again called for NT assistance and did not arrive. PT got clean linens and continued to clean pt up. Approximately 20 minutes later NT arrived and as PT was finishing up. Pt refusing mobility afterward      Pertinent Vitals/Pain Pain Assessment Pain Assessment: Faces Faces Pain Scale: Hurts a little bit Pain Location: generalized Pain Descriptors / Indicators: Grimacing, Guarding     PT Goals (current goals can now be found in the care plan section) Acute Rehab PT Goals Patient Stated Goal: to return home or  go stay with mom PT Goal Formulation: With patient Time For Goal Achievement: 08/16/23 Potential to Achieve Goals: Good Progress towards PT goals: Not progressing toward goals - comment    Frequency    Min 2X/week       AM-PAC PT 6 Clicks Mobility   Outcome Measure  Help needed turning from your back to your side while in a flat bed without using bedrails?: A Little Help needed moving from lying on your back to sitting on the side of a flat bed without using bedrails?: A Little Help needed moving to and from a bed to a chair (including a wheelchair)?:  A Little Help needed standing up from a chair using your arms (e.g., wheelchair or bedside chair)?: A Little Help needed to walk in hospital room?: A Little Help needed climbing 3-5 steps with a railing? : A Little 6 Click Score: 18    End of Session   Activity Tolerance: Patient tolerated treatment well;Treatment limited secondary to medical complications (Comment) (ongoing liquid diarrhea) Patient left: with call bell/phone within reach;with family/visitor present;in bed;with bed alarm set;with nursing/sitter in room Nurse Communication: Mobility status PT Visit Diagnosis: Unsteadiness on feet (R26.81);Muscle weakness (generalized) (M62.81)     Time: 8543-8464 PT Time Calculation (min) (ACUTE ONLY): 39 min  Charges:    $Therapeutic Activity: 38-52 mins PT General Charges $$ ACUTE PT VISIT: 1 Visit                     [name] B. [name] PT, DPT Acute Rehabilitation Services Please use secure chat or  Call Office [phone]    [name] [name] 08/04/2023, 3:45 PM

## 2023-08-04 NOTE — Plan of Care (Signed)

## 2023-08-04 NOTE — Plan of Care (Signed)
  Problem: Coping: Goal: Ability to adjust to condition or change in health will improve Outcome: Progressing   Problem: Clinical Measurements: Goal: Will remain free from infection Outcome: Progressing   Problem: Activity: Goal: Risk for activity intolerance will decrease Outcome: Progressing   Problem: Pain Managment: Goal: General experience of comfort will improve and/or be controlled Outcome: Progressing   Problem: Safety: Goal: Ability to remain free from injury will improve Outcome: Progressing

## 2023-08-05 ENCOUNTER — Other Ambulatory Visit (HOSPITAL_COMMUNITY): Payer: Self-pay

## 2023-08-05 DIAGNOSIS — M6282 Rhabdomyolysis: Secondary | ICD-10-CM | POA: Diagnosis not present

## 2023-08-05 LAB — BASIC METABOLIC PANEL WITH GFR
Anion gap: 9 (ref 5–15)
BUN: 7 mg/dL — ABNORMAL LOW (ref 8–23)
CO2: 23 mmol/L (ref 22–32)
Calcium: 9.9 mg/dL (ref 8.9–10.3)
Chloride: 107 mmol/L (ref 98–111)
Creatinine, Ser: 0.51 mg/dL (ref 0.44–1.00)
GFR, Estimated: >60 mL/min (ref >60)
Glucose, Bld: 113 mg/dL — ABNORMAL HIGH (ref 70–99)
Potassium: 3.6 mmol/L (ref 3.5–5.1)
Sodium: 139 mmol/L (ref 135–145)

## 2023-08-05 LAB — MAGNESIUM: Magnesium: 1.9 mg/dL (ref 1.7–2.4)

## 2023-08-05 MED ORDER — LISINOPRIL 5 MG PO TABS
5.0000 mg | ORAL_TABLET | Freq: Every day | ORAL | 0 refills | Status: DC
  Filled 2023-08-05: qty 30, 30d supply, fill #0

## 2023-08-05 NOTE — TOC Transition Note (Signed)
 Transition of Care [facility]) - Discharge Note   Patient Details  Name: [name] MRN: [id number] Date of Birth: 1955-02-22  Transition of Care [facility]) CM/SW Contact:  Inocente GORMAN Kindle, LCSW Phone Number: 08/05/2023, 9:31 AM   Clinical Narrative:    List of local Assisted Living Facilities provided to family.      Barriers to Discharge: Continued Medical Work up   Patient Goals and CMS Choice Patient states their goals for this hospitalization and ongoing recovery are:: to go home          Discharge Placement                       Discharge Plan and Services Additional resources added to the After Visit Summary for     Discharge Planning Services: CM Consult Post Acute Care Choice: Durable Medical Equipment, Home Health                    HH Arranged: PT, RN [facility] Agency: [facility] Date [facility] Agency Contacted: 08/04/23 Time HH Agency Contacted: [phone] Representative spoke with at [facility] Agency: Darleene  Social Drivers of Health (SDOH) Interventions SDOH Screenings   Food Insecurity: No Food Insecurity (08/02/2023)  Housing: Low Risk  (08/02/2023)  Transportation Needs: Unmet Transportation Needs (08/02/2023)  Utilities: Not At Risk (08/02/2023)  Alcohol Screen: Low Risk  (12/31/2022)  Depression (PHQ2-9): High Risk (04/11/2023)  Financial Resource Strain: Low Risk  (12/31/2022)  Physical Activity: Inactive (12/31/2022)  Social Connections: Moderately Isolated (08/02/2023)  Stress: No Stress Concern Present (12/31/2022)  Tobacco Use: Medium Risk (07/10/2023)  Health Literacy: Inadequate Health Literacy (12/31/2022)     Readmission Risk Interventions     No data to display

## 2023-08-05 NOTE — Discharge Summary (Signed)
 [name] DOB: 12/12/1955 DOA: 07/31/2023  PCP: Abigail Barnie NOVAK, MD  Admit date: 07/31/2023  Discharge date: 08/05/2023  Admitted From: Home   Disposition:  Home   Recommendations for Outpatient Follow-up:   Follow up with PCP in 1-2 weeks  PCP Please obtain BMP/CBC, 2 view CXR in 1week,  (see Discharge instructions)   PCP Please follow up on the following pending results:     Home Health: PT, RN   Equipment/Devices: as below  Consultations: None  Discharge Condition: Stable    CODE STATUS: Full    Diet Recommendation: Heart Healthy Low Carb   CC - Fall   Brief history of present illness from the day of admission and additional interim summary    68 y.o. year old female with past medical history of essential hypertension, CAD, DM type II, COPD, hyperlipidemia, generalized anxiety disorder, Sleep apnea, sarcoidosis in remission, constipation, and chronic muscle spasms. 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 She presents to [facility] ED after fall at home with a prolonged down time in which she was unable to get up, family had not made contact with her in a little over a day. Per family she has frequent falls and is often unable to get up without assistance. [name] reports she has had intermittent falls since 2022 and attributes them to weakness. She does state that sometimes she blacks out with these falls, though she denies blacking prior to this most recent fall. Denies dizziness, palpitations, chest pain, dyspnea, or change in sensorium preceding the falls.                                                                   Hospital Course   Dehydration, generalized weakness, recurrent falls with rhabdomyolysis, hypokalemia and hypomagnesemia, in a patient with recent diagnosis of breast cancer.   Presentation is most consistent with dehydration causing generalized weakness and possible orthostatic hypotension, electrolytes were replaced, since she has recent diagnosis of breast cancer MRI brain was done which did not show any acute changes or metastatic disease, does show some chronic microvascular disease for which she is already on aspirin , statin and LDL on goal.  Echocardiogram was stable, was also seen by PT OT here, aggressively hydrated, she is now symptom-free eager to go home, will go home with a walker and home PT along with RN.  CK levels have trended down nicely.  She is completely symptom-free now.   #Chronic hypoxic respiratory failure on 2-3 L [place] with exertion and at night with underlying history of COPD  - No signs of exacerbation, no  shortness of breath, no oxygen  need, no wheezing, continue home Trelegy Ellipta  and breathing treatments.   #Hypertension -Continue home Coreg  have added low-dose ACE inhibitor for better control PCP to monitor and adjust.   #Coronary Artery Disease - no chest pain, EKG with LVH phenomenon, echocardiogram with preserved EF and no wall motion abnormality, continue aspirin  and beta-blocker for secondary prevention.  LDL at goal.   #Hyperlipidemia - LDL is below 70 continue home statin   #OSA Previously on CPAP nightly, has not used since moving to [place] from [place]  in 2022, no acute issues outpatient follow-up with PCP and pulmonary as needed.   #Depression, Generalized anxiety disorder -Continue home Prozac    # Hypomagnesemia.  Persistent despite aggressive replacement, stable, PCP to monitor, if reoccurs discontinue PPI.   Type 2 diabetes mellitus - On metformin  outpatient, continue outpatient regimen and low carbohydrate diet.  Discharge diagnosis      Principal Problem:   Rhabdomyolysis Active Problems:   COPD, moderate (HCC)   Chronic respiratory failure with hypoxia (HCC)   Hypokalemia   Benign essential HTN   Mild sleep apnea   Depression   Hyperlipidemia   Non-insulin  dependent type 2 diabetes mellitus (HCC)   Hypomagnesemia   Recurrent falls    Discharge instructions    Discharge Instructions     Discharge instructions   Complete by: As directed    Follow with Primary MD Abigail Barnie NOVAK, MD in 7 days   Get CBC, CMP, Magnesium , 2 view Chest X ray -  checked next visit with your primary MD    Activity: As tolerated with Full fall precautions use walker/cane & assistance as needed  Disposition Home    Diet: Heart Healthy Low Carb, check CBGs q. [facility]  Special Instructions: If you have smoked or chewed Tobacco  in the last 2 yrs please stop smoking, stop any regular Alcohol  and or any Recreational drug use.  On your next visit with your primary care physician please Get Medicines reviewed and adjusted.  Please request your Prim.MD to go over all Hospital Tests and Procedure/Radiological results at the follow up, please get all Hospital records sent to your Prim MD by signing hospital release before you go home.  If you experience worsening of your admission symptoms, develop shortness of breath, life threatening emergency, suicidal or homicidal thoughts you must seek medical attention immediately by calling 911 or calling your MD immediately  if symptoms less severe.  You Must read complete instructions/literature along with all the possible adverse reactions/side effects for all the Medicines you take and that have been prescribed to you. Take any new Medicines after you have completely understood and accpet all the possible adverse reactions/side effects.   Do not drive when taking Pain medications.  Do not take more than prescribed Pain, Sleep and Anxiety Medications  Wear Seat belts while driving.    Increase activity slowly   Complete by: As directed        Discharge Medications   Allergies as of 08/05/2023       Reactions   Flonase [fluticasone  Propionate] Other (See Comments)   Nose bleeds        Medication List     PAUSE taking these medications    Semaglutide  (1 MG/DOSE) 4 MG/3ML Sopn Wait to take this until your doctor or other care provider tells you to start again. Inject 1 mg as directed once a week.       STOP taking these medications  FreeStyle Libre 2 Sensor Misc   tiZANidine  2 MG tablet Commonly known as: ZANAFLEX        TAKE these medications    Accu-Chek Guide Test test strip Generic drug: glucose blood Use as instructed to check blood sugars twice a day E11.69   Accu-Chek Guide w/Device Kit UAD to check blood sugars daily. E11.69   Accu-Chek Softclix Lancets lancets Use as instructed to check blood sugars.  E11.69   albuterol  108 (90 Base) MCG/ACT inhaler Commonly known as: ProAir  HFA Inhale 1-2 puffs into the lungs every 6 (six) hours as needed for wheezing or shortness of breath. What changed: Another medication with the same name was removed. Continue taking this medication, and follow the directions you see here.   aspirin  81 MG tablet Take 81 mg by mouth daily.   atorvastatin  80 MG tablet Commonly known as: LIPITOR Take 80 mg by mouth daily.   carvedilol  12.5 MG tablet Commonly known as: COREG  Take 1 tablet (12.5 mg total) by mouth 2 (two) times daily with a meal.   esomeprazole  40 MG capsule Commonly known as: NEXIUM  Take 1 capsule (40 mg total) by mouth daily.   FLUoxetine  40 MG capsule Commonly known as: PROZAC  TAKE 1 CAPSULE BY MOUTH DAILY   FreeStyle Libre 2 Reader Devi Use to check glucose continuously throughout the day. E11.69   Linzess  145 MCG Caps capsule Generic drug: linaclotide  TAKE 1 CAPSULE BY MOUTH DAILY  BEFORE BREAKFAST   lisinopril  5 MG tablet Commonly known as: ZESTRIL  Take 1 tablet (5 mg  total) by mouth daily.   metFORMIN  500 MG 24 hr tablet Commonly known as: GLUCOPHAGE -XR TAKE 1 TABLET BY MOUTH TWICE  DAILY WITH MEALS   montelukast  10 MG tablet Commonly known as: SINGULAIR  Take 1 tablet (10 mg total) by mouth at bedtime.   nitroGLYCERIN  0.3 MG SL tablet Commonly known as: NITROSTAT  DISSOLVE 1 TABLET UNDER THE  TONGUE EVERY 5 MINUTES AS NEEDED FOR CHEST PAIN. MAX OF 3 TABLETS IN 15 MINUTES. CALL 911 IF PAIN  PERSISTS.   traZODone  50 MG tablet Commonly known as: DESYREL  Take 1 tablet (50 mg total) by mouth at bedtime as needed. What changed: reasons to take this   Trelegy Ellipta  100-62.5-25 MCG/ACT Aepb Generic drug: Fluticasone -Umeclidin-Vilant Take 1 puff by mouth daily.               Durable Medical Equipment  (From admission, onward)           Start     Ordered   08/03/23 0738  For home use only DME Walker rolling  Once       Comments: 5 wheel  Question Answer Comment  Walker: With 5 Inch Wheels   Patient needs a walker to treat with the following condition Weakness      08/03/23 0738             Follow-up Information     Abigail Barnie NOVAK, MD Follow up on 08/06/2023.   Specialty: Internal Medicine Why: appt at 0850 AM, also follow-up with your oncologist within a week of discharge. Contact information: [address] [place] [address] [phone]         Medicaid transportation Follow up.   Contact information: [employer] will be the transportation benefit provider for [employer]  members.  To schedule a ride, members can call [phone], 2 days before their appointment.        [facility] Follow  up.   Specialty: Home Health Services Contact information: [address] [place] [address] [phone]                 Major procedures and Radiology Reports - PLEASE review detailed and final reports thoroughly  -       ECHOCARDIOGRAM COMPLETE Result Date: 08/01/2023    ECHOCARDIOGRAM REPORT   Patient Name:   AMILLIA BIFFLE Date of Exam: 08/01/2023 Medical Rec #:  [id number]     Height:       67.0 in Accession #:    [id number]    Weight:       187.0 lb Date of Birth:  01-Jul-1955     BSA:          1.965 m Patient Age:    68 years      BP:           154/86 mmHg Patient Gender: F             HR:           67 bpm. Exam Location:  Inpatient Procedure: 2D Echo, Cardiac Doppler and Color Doppler (Both Spectral and Color            Flow Doppler were utilized during procedure). Indications:    CHF  History:        Patient has no prior history of Echocardiogram examinations.  Sonographer:    Therisa Crouch Referring Phys: JACQUELINE Kaydin Karbowski K [facility] IMPRESSIONS  1. Left ventricular ejection fraction, by estimation, is 60 to 65%. Left ventricular ejection fraction by 2D MOD biplane is 66.3 %. The left ventricle has normal function. The left ventricle has no regional wall motion abnormalities. Left ventricular diastolic parameters were normal. The average left ventricular global longitudinal strain is -20.2 %. The global longitudinal strain is normal.  2. Right ventricular systolic function is normal. The right ventricular size is normal.  3. There is no evidence of cardiac tamponade.  4. The mitral valve is normal in structure. No evidence of mitral valve regurgitation. No evidence of mitral stenosis.  5. The aortic valve is normal in structure. Aortic valve regurgitation is not visualized. No aortic stenosis is present.  6. The inferior vena cava is normal in size with greater than 50% respiratory variability, suggesting right atrial pressure of 3 mmHg. FINDINGS  Left Ventricle: Left ventricular ejection fraction, by estimation, is 60 to 65%. Left ventricular ejection fraction by 2D MOD biplane is 66.3 %. The left ventricle has normal function. The left ventricle has no regional wall motion abnormalities. The average left ventricular global  longitudinal strain is -20.2 %. Strain was performed and the global longitudinal strain is normal. The left ventricular internal cavity size was normal in size. There is no left ventricular hypertrophy. Left ventricular diastolic parameters were normal. Right Ventricle: The right ventricular size is normal. No increase in right ventricular wall thickness. Right ventricular systolic function is normal. Left Atrium: Left atrial size was normal in size. Right Atrium: Right atrial size was normal in size. Pericardium: Trivial pericardial effusion is present. There is no evidence of cardiac tamponade. Mitral Valve: The mitral valve is normal in structure. No evidence of mitral valve regurgitation. No evidence of mitral valve stenosis. Tricuspid Valve: The tricuspid valve is normal in structure. Tricuspid valve regurgitation is not demonstrated. No evidence of tricuspid stenosis. Aortic Valve: The aortic valve is normal in structure. Aortic valve regurgitation is not visualized. No aortic stenosis is present. Pulmonic  Valve: The pulmonic valve was normal in structure. Pulmonic valve regurgitation is not visualized. No evidence of pulmonic stenosis. Aorta: The aortic root is normal in size and structure. Venous: The inferior vena cava is normal in size with greater than 50% respiratory variability, suggesting right atrial pressure of 3 mmHg. IAS/Shunts: No atrial level shunt detected by color flow Doppler.  LEFT VENTRICLE PLAX 2D                        Biplane EF (MOD) LVIDd:         4.20 cm         LV Biplane EF:   Left LVIDs:         2.80 cm                          ventricular LV PW:         0.70 cm                          ejection LV IVS:        0.80 cm                          fraction by LVOT diam:     2.00 cm                          2D MOD LVOT Area:     3.14 cm                         biplane is                                                 66.3 %.  LV Volumes (MOD)               Diastology LV vol d, MOD     49.8 ml       LV e' medial:    5.22 cm/s A2C:                           LV E/e' medial:  13.9 LV vol d, MOD    76.8 ml       LV e' lateral:   10.10 cm/s A4C:                           LV E/e' lateral: 7.2 LV vol s, MOD    17.0 ml A2C:                           2D Longitudinal LV vol s, MOD    26.4 ml       Strain A4C:                           2D Strain GLS   -20.2 % LV SV MOD A2C:   32.8 ml       Avg: LV SV MOD A4C:   76.8 ml LV SV MOD BP:    42.4 ml RIGHT VENTRICLE  IVC RV Basal diam:  2.80 cm     IVC diam: 1.30 cm RV S prime:     11.00 cm/s TAPSE (M-mode): 2.4 cm LEFT ATRIUM             Index LA diam:        3.70 cm 1.88 cm/m LA Vol (A2C):   44.7 ml 22.74 ml/m LA Vol (A4C):   51.4 ml 26.15 ml/m LA Biplane Vol: 48.5 ml 24.68 ml/m   AORTA Ao Root diam: 2.70 cm MITRAL VALVE MV Area (PHT): 3.93 cm    SHUNTS MV Decel Time: 193 msec    Systemic Diam: 2.00 cm MV E velocity: 72.80 cm/s MV A velocity: 74.90 cm/s MV E/A ratio:  0.97 Morene Brownie Electronically signed by Morene Brownie Signature Date/Time: 08/01/2023/6:51:26 PM    Final    MR BRAIN WO CONTRAST Result Date: 08/01/2023 CLINICAL DATA:  68 year old female with recent fall. Neurologic deficit. EXAM: MRI HEAD WITHOUT CONTRAST TECHNIQUE: Multiplanar, multiecho pulse sequences of the brain and surrounding structures were obtained without intravenous contrast. COMPARISON:  Head CT 0152 hours today. FINDINGS: Brain: No restricted diffusion or evidence of acute infarction. Numerous chronic microhemorrhages in the brain, although concentrated in the deep gray nuclei, the brainstem, deep cerebellum. Less pronounced scattered cerebral hemisphere involvement, superior convexities of both hemispheres spared. Superimposed numerous chronic lacunar infarcts in the bilateral deep gray matter nuclei. Chronic lacunar infarcts in the bilateral pons. Moderate T2 and FLAIR hyperintensity, and pronounced volume loss of the corpus callosum with severe chronic  encephalomalacia in the central body of the corpus series 4, image 13. No superimposed cortical encephalomalacia identified. Cavum septum pellucidum, normal variant. No midline shift, mass effect, evidence of mass lesion, ventriculomegaly, extra-axial collection or acute intracranial hemorrhage. Cervicomedullary junction and pituitary are within normal limits. Vascular: Major intracranial vascular flow voids are preserved. Generalized intracranial artery tortuosity. Skull and upper cervical spine: Negative for age visible cervical spine. Visualized bone marrow signal is within normal limits. Sinuses/Orbits: Mildly Disconjugate gaze. Otherwise negative orbits and sinuses. Other: Visible internal auditory structures appear normal. Negative visible scalp and face. IMPRESSION: 1. No acute intracranial abnormality. 2. Severe chronic changes in the brain, including atrophied corpus callosum, numerous chronic microhemorrhages, chronic lacunar infarcts throughout the deep gray nuclei and brainstem. Constellation of findings strongly favors advanced chronic small vessel disease over other considerations (such as chronic demyelinating disease). The appearance is not convincing for amyloid angiopathy, although that entity would be difficult to exclude. Electronically Signed   By: VEAR Hurst M.D.   On: 08/01/2023 12:37   CT CHEST ABDOMEN PELVIS W CONTRAST Result Date: 08/01/2023 CLINICAL DATA:  Recent fall with chest and abdominal pain, initial encounter EXAM: CT CHEST, ABDOMEN, AND PELVIS WITH CONTRAST TECHNIQUE: Multidetector CT imaging of the chest, abdomen and pelvis was performed following the standard protocol during bolus administration of intravenous contrast. RADIATION DOSE REDUCTION: This exam was performed according to the departmental dose-optimization program which includes automated exposure control, adjustment of the mA and/or kV according to patient size and/or use of iterative reconstruction technique.  CONTRAST:  75mL OMNIPAQUE  IOHEXOL  350 MG/ML SOLN COMPARISON:  None Available. FINDINGS: CT CHEST FINDINGS Cardiovascular: Atherosclerotic calcifications of the thoracic aorta are noted. No aneurysmal dilatation or dissection is seen. Coronary calcifications are noted. No cardiac enlargement is noted. No large central pulmonary embolus is seen. Timing was not performed for embolus evaluation. Mediastinum/Nodes: Thoracic inlet is within normal limits. No hilar or mediastinal adenopathy is noted. The esophagus  is within normal limits. Lungs/Pleura: Lungs are well aerated bilaterally. Diffuse emphysematous changes are seen. Calcified granuloma is noted in the right upper lobe. Slight increased density is noted in the lateral aspect of the left upper lobe diffusely which may represent some very early pneumonic infiltrate. No sizable effusion is seen. Musculoskeletal: No chest wall mass or suspicious bone lesions identified. CT ABDOMEN PELVIS FINDINGS Hepatobiliary: No focal liver abnormality is seen. No gallstones, gallbladder wall thickening, or biliary dilatation. Pancreas: Unremarkable. No pancreatic ductal dilatation or surrounding inflammatory changes. Spleen: Normal in size without focal abnormality. Adrenals/Urinary Tract: Right adrenal gland is within normal limits. Enhancing left adrenal lesion is noted measuring approximately 2.5 cm stable in appearance from prior exam. This is stable over multiple previous exams consistent adenoma. Kidneys show normal enhancement pattern bilaterally. Punctate nonobstructing stone is noted in the upper pole of the right kidney. Ureters are within normal limits. The bladder is well distended. Stomach/Bowel: No obstructive or inflammatory changes of the colon are seen. Scattered fecal material is noted throughout the colon without obstructive change. Terminal ileum is within normal limits. The appendix is unremarkable. Small bowel shows no obstructive change. The stomach is  decompressed. Vascular/Lymphatic: Aortic atherosclerosis. No enlarged abdominal or pelvic lymph nodes. Reproductive: Status post hysterectomy. No adnexal masses. Other: No abdominal wall hernia or abnormality. No abdominopelvic ascites. Musculoskeletal: No acute or significant osseous findings. Chronic changes about the right hip joint noted. IMPRESSION: CT of the chest: Emphysematous changes as well as findings of prior granulomatous disease. Some slight increased opacity is noted in the left upper lobe peripherally which may represent some very early inflammatory change. No focal confluent infiltrate is noted. CT of the abdomen and pelvis: Stable left adrenal lesion consistent with adenoma. No further follow-up is recommended. Punctate nonobstructing right renal stone. No other focal abnormality is noted. Electronically Signed   By: Oneil Devonshire M.D.   On: 08/01/2023 02:21   CT Head Wo Contrast Result Date: 08/01/2023 CLINICAL DATA:  Recent fall with headaches, initial encounter EXAM: CT HEAD WITHOUT CONTRAST TECHNIQUE: Contiguous axial images were obtained from the base of the skull through the vertex without intravenous contrast. RADIATION DOSE REDUCTION: This exam was performed according to the departmental dose-optimization program which includes automated exposure control, adjustment of the mA and/or kV according to patient size and/or use of iterative reconstruction technique. COMPARISON:  None Available. FINDINGS: Brain: No evidence of acute infarction, hemorrhage, hydrocephalus, extra-axial collection or mass lesion/mass effect. Some septum pellucidum is noted which is a normal variant. Mild atrophic changes and chronic white matter ischemic changes are seen. Vascular: No hyperdense vessel or unexpected calcification. Skull: Normal. Negative for fracture or focal lesion. Sinuses/Orbits: No acute finding. Other: None. IMPRESSION: Chronic atrophic and ischemic changes.  No acute abnormality noted.  Electronically Signed   By: Oneil Devonshire M.D.   On: 08/01/2023 02:05   CT Maxillofacial Wo Contrast Result Date: 08/01/2023 CLINICAL DATA:  Recent fall with facial pain, initial encounter EXAM: CT MAXILLOFACIAL WITHOUT CONTRAST TECHNIQUE: Multidetector CT imaging of the maxillofacial structures was performed. Multiplanar CT image reconstructions were also generated. RADIATION DOSE REDUCTION: This exam was performed according to the departmental dose-optimization program which includes automated exposure control, adjustment of the mA and/or kV according to patient size and/or use of iterative reconstruction technique. COMPARISON:  None Available. FINDINGS: Osseous: No acute fracture is identified. Orbits: Orbits and their contents are within normal limits. Sinuses: Paranasal sinuses are unremarkable. Soft tissues: Soft tissue structures show very mild  soft tissue swelling in the left supraorbital region consistent with the recent injury. No other focal abnormality is noted. Limited intracranial: Within normal limits. IMPRESSION: No acute fracture is noted. Mild left supraorbital soft tissue swelling. Electronically Signed   By: Oneil Devonshire M.D.   On: 08/01/2023 02:04   CT Cervical Spine Wo Contrast Result Date: 08/01/2023 CLINICAL DATA:  Recent fall with neck pain, initial encounter EXAM: CT CERVICAL SPINE WITHOUT CONTRAST TECHNIQUE: Multidetector CT imaging of the cervical spine was performed without intravenous contrast. Multiplanar CT image reconstructions were also generated. RADIATION DOSE REDUCTION: This exam was performed according to the departmental dose-optimization program which includes automated exposure control, adjustment of the mA and/or kV according to patient size and/or use of iterative reconstruction technique. COMPARISON:  None Available. FINDINGS: Alignment: Mild loss of the normal cervical lordosis is noted. Skull base and vertebrae: 7 cervical segments are well visualized. Vertebral  body height is well maintained. Mild osteophytic change and facet hypertrophic changes are noted. No acute fracture or acute facet abnormality is seen. Soft tissues and spinal canal: Surrounding soft tissue structures are within normal limits. Upper chest: Visualized lung apices demonstrate emphysematous changes. Other: None IMPRESSION: Mild degenerative change of the cervical spine without acute abnormality. Electronically Signed   By: Oneil Devonshire M.D.   On: 08/01/2023 02:02   DG Shoulder Left Result Date: 07/31/2023 CLINICAL DATA:  Fall EXAM: LEFT SHOULDER - 2+ VIEW COMPARISON:  None Available. FINDINGS: Moderate AC joint degenerative change. Mild glenohumeral degenerative change. No fracture or malalignment IMPRESSION: Degenerative changes. Electronically Signed   By: Luke Bun M.D.   On: 07/31/2023 23:27    Micro Results    No results found for this or any previous visit (from the past 240 hours).  Today   Subjective    Abigail Gains today has no headache,no chest abdominal pain,no new weakness tingling or numbness, feels much better wants to go home today.    Objective   Blood pressure 160/75, pulse 64, temperature 98.4 F (36.9 C), temperature source Oral, resp. rate 20, height 5' 7 (1.702 m), weight 84.8 kg, SpO2 95%.   Intake/Output Summary (Last 24 hours) at 08/05/2023 0746 Last data filed at 08/05/2023 0458 Gross per 24 hour  Intake --  Output 1650 ml  Net -1650 ml    Exam  Awake Alert, No new F.N deficits,    [place].AT,PERRAL Supple Neck,   Symmetrical Chest wall movement, Good air movement bilaterally, CTAB RRR,No Gallops,   +ve B.Sounds, Abd Soft, Non tender,  No Cyanosis, Clubbing or edema    Data Review   Recent Labs  Lab 07/31/23 2340 08/01/23 0558 08/02/23 0520 08/03/23 0538  WBC 7.9 6.9 7.1 6.1  HGB 15.1* 14.3 13.8 12.7  HCT 44.4 42.7 42.7 39.0  PLT 215 210 165 164  MCV 87.7 89.1 91.2 92.2  MCH 29.8 29.9 29.5 30.0  MCHC 34.0 33.5 32.3 32.6  RDW  12.7 12.8 13.2 13.2  LYMPHSABS 1.0  --  1.6 1.7  MONOABS 0.4  --  0.6 0.6  EOSABS 0.0  --  0.1 0.1  BASOSABS 0.0  --  0.0 0.0    Recent Labs  Lab 07/31/23 2340 08/01/23 0558 08/02/23 0520 08/03/23 0538 08/04/23 0648 08/05/23 0604  NA 138 139 139  --  138 139  K 3.2* 3.2* 3.8  --  3.6 3.6  CL 101 106 108  --  106 107  CO2 22 22 21*  --  24 23  ANIONGAP 15 11 10   --  8 9  GLUCOSE 130* 104* 111*  --  110* 113*  BUN 10 8 8   --  12 7*  CREATININE 0.66 0.39* 0.60  --  0.61 0.51  AST 85*  --   --   --   --   --   ALT 35  --   --   --   --   --   ALKPHOS 48  --   --   --   --   --   BILITOT 1.1  --   --   --   --   --   ALBUMIN 3.6  --   --   --   --   --   PROCALCITON  --   --  <0.10 <0.10  --   --   LATICACIDVEN  --  1.0  --   --   --   --   BNP  --   --  162.4* 355.8*  --   --   MG  --  1.3* 1.4* 1.6* 1.6* 1.9  PHOS  --  2.5  --   --   --   --   CALCIUM  11.3* 10.4* 10.0  --  10.1 9.9    Total Time in preparing paper work, data evaluation and todays exam - 35 minutes  Signature  -    Lavada Stank M.D on 08/05/2023 at 7:46 AM   -  To page go to [email or web address]

## 2023-08-05 NOTE — Progress Notes (Signed)
 Occupational Therapy Treatment Patient Details Name: [name] MRN: [id number] DOB: 1955/04/23 Today's Date: 08/05/2023   History of present illness Pt is a 68 y/o female admitted 7/16 after fall at home with a prolonged downtime.  Work up includes rhabdo and continues.  PMHx:  ASD, Breast CA, COPD, DM2, HTN,   OT comments  Pt preparing to discharge with sister. Sister, at bedside, aware pt needs help at home. Reports pt's brother is looking into an aide vs ALF. Pt dressed for home with set up to min assist. Ambulated stabilizing on furniture vs hand held assist. Updated d/c recommendation to [facility], pt is refusing all post acute therapies.       If plan is discharge home, recommend the following:  A little help with walking and/or transfers;A lot of help with bathing/dressing/bathroom;Assistance with cooking/housework;Assist for transportation;Help with stairs or ramp for entrance   Equipment Recommendations  BSC/3in1;Other (comment)    Recommendations for Other Services      Precautions / Restrictions Precautions Precautions: Fall Recall of Precautions/Restrictions: Impaired Restrictions Weight Bearing Restrictions Per Provider Order: No       Mobility Bed Mobility Overal bed mobility: Modified Independent             General bed mobility comments: HOB Up    Transfers Overall transfer level: Needs assistance Equipment used: 1 person hand held assist Transfers: Sit to/from Stand Sit to Stand: Supervision                 Balance Overall balance assessment: Needs assistance Sitting-balance support: No upper extremity supported, Feet supported Sitting balance-Leahy Scale: Good       Standing balance-Leahy Scale: Poor Standing balance comment: requires at least one hand stabilization with dynamic balance                           ADL either performed or assessed with clinical judgement   ADL Overall ADL's : Needs assistance/impaired                  Upper Body Dressing : Set up;Sitting;Standing;Supervision/safety   Lower Body Dressing: Minimal assistance;Sit to/from stand                      Extremity/Trunk Assessment              Vision       Perception     Praxis     Communication Communication Communication: No apparent difficulties   Cognition Arousal: Alert Behavior During Therapy: Flat affect Cognition: Cognition impaired     Awareness: Intellectual awareness impaired, Online awareness impaired Memory impairment (select all impairments): Short-term memory Attention impairment (select first level of impairment): Sustained attention Executive functioning impairment (select all impairments): Problem solving, Sequencing OT - Cognition Comments: sister in room, aware of pt's deficits, family is planning to help her obtain an aide vs ALF                 Following commands: Intact        Cueing   Cueing Techniques: Verbal cues  Exercises      Shoulder Instructions       General Comments      Pertinent Vitals/ Pain       Pain Assessment Pain Assessment: Faces Faces Pain Scale: Hurts a little bit Pain Location: L shoulder Pain Descriptors / Indicators: Sore Pain Intervention(s): Monitored during session, Repositioned  Home Living  Prior Functioning/Environment              Frequency  Min 2X/week        Progress Toward Goals  OT Goals(current goals can now be found in the care plan section)  Progress towards OT goals: Progressing toward goals  Acute Rehab OT Goals OT Goal Formulation: With patient Time For Goal Achievement: 08/16/23 Potential to Achieve Goals: Good  Plan      Co-evaluation                 AM-PAC OT 6 Clicks Daily Activity     Outcome Measure   Help from another person eating meals?: None Help from another person taking care of personal grooming?: A Little Help  from another person toileting, which includes using toliet, bedpan, or urinal?: A Little Help from another person bathing (including washing, rinsing, drying)?: A Little Help from another person to put on and taking off regular upper body clothing?: None Help from another person to put on and taking off regular lower body clothing?: A Little 6 Click Score: 20    End of Session    OT Visit Diagnosis: Unsteadiness on feet (R26.81);Other abnormalities of gait and mobility (R26.89);Pain;Muscle weakness (generalized) (M62.81);Other symptoms and signs involving cognitive function   Activity Tolerance Patient tolerated treatment well   Patient Left in bed;with call bell/phone within reach;with bed alarm set;with family/visitor present   Nurse Communication          Time: 9161-9141 OT Time Calculation (min): 20 min  Charges: OT General Charges $OT Visit: 1 Visit OT Treatments $Self Care/Home Management : 8-22 mins  Mliss HERO, OTR/L Acute Rehabilitation Services Office: [phone]   Kennth Mliss Helling 08/05/2023, 9:15 AM

## 2023-08-05 NOTE — Plan of Care (Signed)
  Problem: Coping: Goal: Ability to adjust to condition or change in health will improve Outcome: Progressing   Problem: Clinical Measurements: Goal: Will remain free from infection Outcome: Progressing Goal: Respiratory complications will improve Outcome: Progressing   Problem: Coping: Goal: Level of anxiety will decrease Outcome: Progressing   Problem: Pain Managment: Goal: General experience of comfort will improve and/or be controlled Outcome: Progressing

## 2023-08-05 NOTE — Discharge Instructions (Signed)
 Follow with Primary MD Vicci Barnie NOVAK, MD in 7 days   Get CBC, CMP, Magnesium , 2 view Chest X ray -  checked next visit with your primary MD    Activity: As tolerated with Full fall precautions use walker/cane & assistance as needed  Disposition Home    Diet: Heart Healthy Low Carb, check CBGs q. [facility]  Special Instructions: If you have smoked or chewed Tobacco  in the last 2 yrs please stop smoking, stop any regular Alcohol  and or any Recreational drug use.  On your next visit with your primary care physician please Get Medicines reviewed and adjusted.  Please request your Prim.MD to go over all Hospital Tests and Procedure/Radiological results at the follow up, please get all Hospital records sent to your Prim MD by signing hospital release before you go home.  If you experience worsening of your admission symptoms, develop shortness of breath, life threatening emergency, suicidal or homicidal thoughts you must seek medical attention immediately by calling 911 or calling your MD immediately  if symptoms less severe.  You Must read complete instructions/literature along with all the possible adverse reactions/side effects for all the Medicines you take and that have been prescribed to you. Take any new Medicines after you have completely understood and accpet all the possible adverse reactions/side effects.   Do not drive when taking Pain medications.  Do not take more than prescribed Pain, Sleep and Anxiety Medications  Wear Seat belts while driving.

## 2023-08-06 ENCOUNTER — Ambulatory Visit: Attending: Internal Medicine | Admitting: Internal Medicine

## 2023-08-06 ENCOUNTER — Telehealth: Payer: Self-pay | Admitting: *Deleted

## 2023-08-06 ENCOUNTER — Other Ambulatory Visit (HOSPITAL_COMMUNITY): Payer: Self-pay

## 2023-08-06 ENCOUNTER — Ambulatory Visit: Admitting: Radiation Oncology

## 2023-08-06 ENCOUNTER — Telehealth: Payer: Self-pay

## 2023-08-06 ENCOUNTER — Other Ambulatory Visit: Payer: Self-pay

## 2023-08-06 VITALS — BP 166/93 | HR 66 | Temp 98.2°F | Ht 67.0 in | Wt 184.0 lb

## 2023-08-06 DIAGNOSIS — R3981 Functional urinary incontinence: Secondary | ICD-10-CM

## 2023-08-06 DIAGNOSIS — J449 Chronic obstructive pulmonary disease, unspecified: Secondary | ICD-10-CM

## 2023-08-06 DIAGNOSIS — N3941 Urge incontinence: Secondary | ICD-10-CM

## 2023-08-06 DIAGNOSIS — Z9181 History of falling: Secondary | ICD-10-CM | POA: Diagnosis not present

## 2023-08-06 DIAGNOSIS — C50912 Malignant neoplasm of unspecified site of left female breast: Secondary | ICD-10-CM

## 2023-08-06 DIAGNOSIS — T07XXXA Unspecified multiple injuries, initial encounter: Secondary | ICD-10-CM

## 2023-08-06 DIAGNOSIS — F331 Major depressive disorder, recurrent, moderate: Secondary | ICD-10-CM

## 2023-08-06 DIAGNOSIS — J418 Mixed simple and mucopurulent chronic bronchitis: Secondary | ICD-10-CM | POA: Diagnosis not present

## 2023-08-06 DIAGNOSIS — Z09 Encounter for follow-up examination after completed treatment for conditions other than malignant neoplasm: Secondary | ICD-10-CM

## 2023-08-06 DIAGNOSIS — Z7984 Long term (current) use of oral hypoglycemic drugs: Secondary | ICD-10-CM | POA: Diagnosis not present

## 2023-08-06 DIAGNOSIS — E1169 Type 2 diabetes mellitus with other specified complication: Secondary | ICD-10-CM

## 2023-08-06 DIAGNOSIS — I10 Essential (primary) hypertension: Secondary | ICD-10-CM | POA: Diagnosis not present

## 2023-08-06 DIAGNOSIS — I152 Hypertension secondary to endocrine disorders: Secondary | ICD-10-CM

## 2023-08-06 DIAGNOSIS — Z9981 Dependence on supplemental oxygen: Secondary | ICD-10-CM

## 2023-08-06 DIAGNOSIS — R269 Unspecified abnormalities of gait and mobility: Secondary | ICD-10-CM | POA: Diagnosis not present

## 2023-08-06 DIAGNOSIS — J9611 Chronic respiratory failure with hypoxia: Secondary | ICD-10-CM | POA: Diagnosis not present

## 2023-08-06 MED ORDER — ALBUTEROL SULFATE (2.5 MG/3ML) 0.083% IN NEBU
2.5000 mg | INHALATION_SOLUTION | Freq: Four times a day (QID) | RESPIRATORY_TRACT | 1 refills | Status: AC | PRN
  Filled 2023-08-06 – 2023-08-30 (×3): qty 150, 13d supply, fill #0

## 2023-08-06 NOTE — Transitions of Care (Post Inpatient/ED Visit) (Signed)
   08/06/2023  Name: KATHLEAN CINCO MRN: [id number] DOB: 1955/02/07  Today's TOC FU Call Status: Today's TOC FU Call Status:: Successful TOC FU Call Completed TOC FU Call Complete Date: 08/06/23 Patient's Name and Date of Birth confirmed.  Transition Care Management Follow-up Telephone Call Date of Discharge: 08/05/23 Discharge Facility: [facility]) Type of Discharge: Inpatient Admission Primary Inpatient Discharge Diagnosis:: Rhabdomyolysis How have you been since you were released from the hospital?: Better Any questions or concerns?: No  Items Reviewed: Did you receive and understand the discharge instructions provided?: Yes Medications obtained,verified, and reconciled?: No Medications Not Reviewed Reasons:: Other: (Patient reports having all of her medications. She did not have time to go over medications individually.) Any new allergies since your discharge?: No Dietary orders reviewed?: Yes Type of Diet Ordered:: Heart Healthy Low Carb Do you have support at home?: No  Medications Reviewed Today:Patient did not have time to review medications today. Medications Reviewed Today   Medications were not reviewed in this encounter     Home Care and Equipment/Supplies: Were Home Health Services Ordered?: Yes Name of Home Health Agency:: [facility] Has Agency set up a time to come to your home?: Yes (patient declined [facility] services)  Functional Questionnaire:Unable to complete    Follow up appointments reviewed: PCP Follow-up appointment confirmed?: Yes Date of PCP follow-up appointment?: 08/06/23 Follow-up Provider: Dr. Vicci  Patient was waiting on transportation and did not have time to complete TOC and assessment.   Andrea Dimes RN, BSN [place]  [facility] RN Care Manager [phone]

## 2023-08-06 NOTE — Patient Instructions (Signed)
 VISIT SUMMARY:  You had a follow-up appointment today after your recent hospitalization due to a fall. We discussed your current health status, medications, and living situation. Your sister accompanied you to the appointment.  YOUR PLAN:  -POST-FALL COMPLICATIONS: You were hospitalized for dehydration, weakness, and muscle damage after your fall. We will arrange for a caseworker to look into Medicaid coverage for home health services and provide a letter for a lock key box for emergency access. We also recommend finding a one-level apartment or considering assisted living facilities.  -SKIN ABRASIONS AND WOUNDS: You have multiple skin abrasions and wounds from your fall. We have provided you with an antibiotic cream to apply daily to help with healing.  -CHRONIC OBSTRUCTIVE PULMONARY DISEASE (COPD): COPD is a lung condition that makes it hard to breathe. You are on continuous oxygen  and need nebulizer liquid medication. We have prescribed albuterol  nebulizer liquid treatments for you to pick up at [facility].  -HYPERTENSION: Hypertension is high blood pressure. Your blood pressure was elevated today because you missed your morning doses of carvedilol  and lisinopril . Please take these medications as soon as you get home and try to limit your sodium intake.  -TYPE 2 DIABETES MELLITUS: Type 2 Diabetes is a condition that affects how your body processes sugar. Your last A1c was 6.2, which is within the target range. Continue taking metformin  500 mg twice daily and hold off on taking Ozempic  for now.  -DEPRESSION: Depression is a mood disorder that causes persistent feelings of sadness and loss of interest. You are currently taking Prozac  and have occasional passive suicidal thoughts. We will refer you to a psychiatrist for further evaluation and management.  -INCONTINENCE: Incontinence is the inability to control urination. You are interested in a wick device for urine collection. A  caseworker will look into the availability of this device for you.  -GENERAL HEALTH MAINTENANCE: We are considering additional support programs like CAP and PACE for you. A caseworker will assist you with the application process.  INSTRUCTIONS:  A caseworker will follow up on Medicaid coverage for home health services and the availability of a wick device. Please ensure you receive your antibiotic cream and nebulizer liquid treatments. Follow up with the psychiatrist as referred.

## 2023-08-06 NOTE — Progress Notes (Unsigned)
 [name], female    DOB: 1955-12-31  MRN: [id number]  CC: Hospitalization Follow-up (Hospitalization f/u. Layvonne nebulizer solution /Requesting urinary incontinence cathter)   Subjective: [name] is a 68 y.o. female who presents for chronic ds management. Her concerns today include:  Patient with history of HTN, HL, COPD, DM type II, anxiety/depression, LT breast CA s/p lumpectomy 05/2023, mild OSA    Discussed the use of AI scribe software for clinical note transcription with the patient, who gave verbal consent to proceed.  History of Present Illness [name] is a 68 year old female who presents for follow-up after recent hospitalization due to a fall and subsequent complications. She is accompanied by her sister, who lives in the [place] area.  She was hospitalized from July 16th to July 21st following a fall at home. She fell in the rain and was unable to get up, requiring assistance from neighbors and emergency services who had to break down her door. She was told she was dehydrated, weak, and had rhabdomyolysis during her hospitalization. Her potassium and magnesium  levels were also abnormal. She received fluids and physical therapy during her hospital stay.  She lives alone in an apartment and has had minor falls previously, but this was her first significant fall. She has a walker at home but did not bring it to the appointment, using a cane instead. She has declined home physical therapy and nursing visits due to cost concerns and lack of perceived benefit. She has been unable to secure affordable home health assistance.  She uses oxygen  at three liters continuously for her COPD and has a nebulizer but lacks the liquid medication for it. She takes carvedilol  and lisinopril  for hypertension but had not taken them on the morning of the visit. She is on metformin  500 mg twice daily for diabetes, with her last A1c being 6.2 in May.  She has a history of  depression and is currently taking Prozac . She has experienced thoughts of wishing her situation would end due to stress but denies any active suicidal ideation. She has a history of sarcoidosis and previously attended mental health services but has not done so in recent years.  She experiences urinary incontinence and uses Depends, which she purchases with her benefits. She described a device she saw advertised that might help with incontinence but does not currently use one. She lives in a two-story apartment and has difficulty with stairs, which contributed to her fall.    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/02/2023   Hypokalemia 04/02/2023   History of COPD 04/02/2023   Irritable bowel syndrome 04/02/2023   Malignant neoplasm of lower-outer quadrant of left breast of female, estrogen receptor positive (HCC) 03/15/2023   Invasive ductal carcinoma of breast, left (HCC) 03/13/2023   Osteopenia after menopause 02/27/2023   Nondisplaced fracture of proximal phalanx of left ring finger 07/25/2021   Displaced fracture of proximal phalanx of left little finger, initial encounter for closed fracture 07/07/2021   Onychomycosis of toenail 03/22/2021   Dry skin dermatitis 03/22/2021   Non-insulin  dependent type 2 diabetes mellitus (HCC) 02/18/2021   COPD, moderate (HCC) 11/08/2020  Depression 11/08/2020   Hyperlipidemia 11/08/2020   Mild sleep apnea 10/17/2020   Centrilobular emphysema (HCC) 06/26/2014   Benign essential HTN 06/26/2014   Dyspnea 06/25/2014     Current Outpatient Medications on File Prior to Visit  Medication Sig Dispense Refill    Accu-Chek Softclix Lancets lancets Use as instructed to check blood sugars.  E11.69 100 each 12   albuterol  (PROAIR  HFA) 108 (90 Base) MCG/ACT inhaler Inhale 1-2 puffs into the lungs every 6 (six) hours as needed for wheezing or shortness of breath. 18 g 2   aspirin  81 MG tablet Take 81 mg by mouth daily.     atorvastatin  (LIPITOR) 80 MG tablet Take 80 mg by mouth daily.     Blood Glucose Monitoring Suppl (ACCU-CHEK GUIDE) w/Device KIT UAD to check blood sugars daily. E11.69 1 kit 0   carvedilol  (COREG ) 12.5 MG tablet Take 1 tablet (12.5 mg total) by mouth 2 (two) times daily with a meal. 180 tablet 3   Continuous Glucose Receiver (FREESTYLE LIBRE 2 READER) DEVI Use to check glucose continuously throughout the day. E11.69 1 each 0   esomeprazole  (NEXIUM ) 40 MG capsule Take 1 capsule (40 mg total) by mouth daily. 90 capsule 3   FLUoxetine  (PROZAC ) 40 MG capsule TAKE 1 CAPSULE BY MOUTH DAILY 100 capsule 1   Fluticasone -Umeclidin-Vilant (TRELEGY ELLIPTA ) 100-62.5-25 MCG/ACT AEPB Take 1 puff by mouth daily. 180 each 1   glucose blood (ACCU-CHEK GUIDE TEST) test strip Use as instructed to check blood sugars twice a day E11.69 100 each 12   linaclotide  (LINZESS ) 145 MCG CAPS capsule TAKE 1 CAPSULE BY MOUTH DAILY  BEFORE BREAKFAST 90 capsule 0   lisinopril  (ZESTRIL ) 5 MG tablet Take 1 tablet (5 mg total) by mouth daily. 30 tablet 0   metFORMIN  (GLUCOPHAGE -XR) 500 MG 24 hr tablet TAKE 1 TABLET BY MOUTH TWICE  DAILY WITH MEALS 180 tablet 3   montelukast  (SINGULAIR ) 10 MG tablet Take 1 tablet (10 mg total) by mouth at bedtime. 90 tablet 3   nitroGLYCERIN  (NITROSTAT ) 0.3 MG SL tablet DISSOLVE 1 TABLET UNDER THE  TONGUE EVERY 5 MINUTES AS NEEDED FOR CHEST PAIN. MAX OF 3 TABLETS IN 15 MINUTES. CALL 911 IF PAIN  PERSISTS. 100 tablet 3   traZODone  (DESYREL ) 50 MG tablet Take 1 tablet (50 mg total) by mouth at bedtime as needed. (Patient taking differently: Take 50 mg by mouth at bedtime as needed for sleep.) 90  tablet 3   [Paused] Semaglutide , 1 MG/DOSE, 4 MG/3ML SOPN Inject 1 mg as directed once a week. (Patient not taking: Reported on 08/06/2023) 9 mL 1   Current Facility-Administered Medications on File Prior to Visit  Medication Dose Route Frequency Provider Last Rate Last Admin   Chlorhexidine  Gluconate Cloth 2 % PADS 6 each  6 each Topical Once Tsuei, Matthew, MD       And   Chlorhexidine  Gluconate Cloth 2 % PADS 6 each  6 each Topical Once Tsuei, Matthew, MD        Allergies  Allergen Reactions   Flonase [Fluticasone  Propionate] Other (See Comments)    Nose bleeds    Social History   Socioeconomic History   Marital status: Single    Spouse name: Not on file   Number of children: 3   Years of education: Not on file   Highest education level: Not on file  Occupational History   Not on file  Tobacco Use   Smoking status: Former    Current  packs/day: 0.00    Average packs/day: 1 pack/day for 38.0 years (38.0 ttl pk-yrs)    Types: Cigarettes    Start date: 01/15/1970    Quit date: 01/16/2008    Years since quitting: 15.5   Smokeless tobacco: Never  Vaping Use   Vaping status: Never Used  Substance and Sexual Activity   Alcohol use: No    Alcohol/week: 0.0 standard drinks of alcohol   Drug use: No   Sexual activity: Not Currently    Birth control/protection: Abstinence  Other Topics Concern   Not on file  Social History Narrative   Not on file   Social Drivers of Health   Financial Resource Strain: Low Risk  (12/31/2022)   Overall Financial Resource Strain (CARDIA)    Difficulty of Paying Living Expenses: Not very hard  Food Insecurity: No Food Insecurity (08/02/2023)   Hunger Vital Sign    Worried About Running Out of Food in the Last Year: Never true    Ran Out of Food in the Last Year: Never true  Transportation Needs: Unmet Transportation Needs (08/02/2023)   PRAPARE - Transportation    Lack of Transportation (Medical): Yes    Lack of Transportation (Non-Medical):  Yes  Physical Activity: Inactive (12/31/2022)   Exercise Vital Sign    Days of Exercise per Week: 0 days    Minutes of Exercise per Session: 0 min  Stress: No Stress Concern Present (12/31/2022)   [employer] of Occupational Health - Occupational Stress Questionnaire    Feeling of Stress : Not at all  Social Connections: Moderately Isolated (08/02/2023)   Social Connection and Isolation Panel    Frequency of Communication with Friends and Family: More than three times a week    Frequency of Social Gatherings with Friends and Family: Never    Attends Religious Services: Never    Database administrator or Organizations: Yes    Attends Banker Meetings: Never    Marital Status: Divorced  Catering manager Violence: Not At Risk (08/02/2023)   Humiliation, Afraid, Rape, and Kick questionnaire    Fear of Current or Ex-Partner: No    Emotionally Abused: No    Physically Abused: No    Sexually Abused: No    Family History  Problem Relation Age of Onset   Allergies Mother    Allergies Daughter    Asthma Son        had as a child   BRCA 1/2 Neg Hx    Breast cancer Neg Hx     Past Surgical History:  Procedure Laterality Date   BREAST BIOPSY Left 03/11/2023   US  LT BREAST BX W LOC DEV 1ST LESION IMG BX SPEC US  GUIDE 03/11/2023 [facility]   BREAST BIOPSY Left 06/07/2023   US  LT RADIOACTIVE SEED LOC 06/07/2023 [facility]   BREAST LUMPECTOMY WITH RADIOACTIVE SEED LOCALIZATION Left 06/11/2023   Procedure: BREAST LUMPECTOMY WITH RADIOACTIVE SEED LOCALIZATION;  Surgeon: Belinda Cough, MD;  Location: [facility] OR;  Service: General;  Laterality: Left;  LEFT BREAST RADIOACTIVE SEED LOCALIZED LUMPECTOMY   TEE WITHOUT CARDIOVERSION N/A 08/24/2014   Procedure: TRANSESOPHAGEAL ECHOCARDIOGRAM (TEE);  Surgeon: Gordy Bergamo, MD;  Location[facility] END[facility]COPY;  Service: Cardiovascular;  Laterality: N/A;   TOTAL ABDOMINAL HYSTERECTOMY  01/15/2005    ROS: Review of Systems Negative  except as stated above  PHYSICAL EXAM: BP (!) 167/80 (BP Location: Left Arm, Patient Position: Sitting, Cuff Size: Normal)   Pulse 72   Temp 98.2 F (36.8 C) (  Oral)   Ht 5' 7 (1.702 m)   Wt 184 lb (83.5 kg)   SpO2 91%   BMI 28.82 kg/m   Wt Readings from Last 3 Encounters:  08/06/23 184 lb (83.5 kg)  07/31/23 187 lb (84.8 kg)  07/10/23 181 lb 9.6 oz (82.4 kg)   The above Physical Exam  {female adult master:310786} {female adult master:310785}     Latest Ref Rng & Units 08/05/2023    6:04 AM 08/04/2023    6:48 AM 08/02/2023    5:20 AM  CMP  Glucose 70 - 99 mg/dL 886  889  888   BUN 8 - 23 mg/dL 7  12  8    Creatinine 0.44 - 1.00 mg/dL 9.48  9.38  9.39   Sodium 135 - 145 mmol/L 139  138  139   Potassium 3.5 - 5.1 mmol/L 3.6  3.6  3.8   Chloride 98 - 111 mmol/L 107  106  108   CO2 22 - 32 mmol/L 23  24  21    Calcium  8.9 - 10.3 mg/dL 9.9  89.8  89.9    Lipid Panel     Component Value Date/Time   CHOL 108 08/02/2023 0519   CHOL 166 03/29/2021 0000   TRIG 49 08/02/2023 0519   HDL 41 08/02/2023 0519   HDL 51 03/29/2021 0000   CHOLHDL 2.6 08/02/2023 0519   VLDL 10 08/02/2023 0519   LDLCALC 57 08/02/2023 0519   LDLCALC 94 01/29/2022 1403    CBC    Component Value Date/Time   WBC 6.1 08/03/2023 0538   RBC 4.23 08/03/2023 0538   HGB 12.7 08/03/2023 0538   HGB 13.9 11/08/2020 1509   HCT 39.0 08/03/2023 0538   HCT 41.8 11/08/2020 1509   PLT 164 08/03/2023 0538   PLT 217 11/08/2020 1509   MCV 92.2 08/03/2023 0538   MCV 90 11/08/2020 1509   MCH 30.0 08/03/2023 0538   MCHC 32.6 08/03/2023 0538   RDW 13.2 08/03/2023 0538   RDW 12.6 11/08/2020 1509   LYMPHSABS 1.7 08/03/2023 0538   LYMPHSABS 1.8 11/08/2020 1509   MONOABS 0.6 08/03/2023 0538   EOSABS 0.1 08/03/2023 0538   EOSABS 0.1 11/08/2020 1509   BASOSABS 0.0 08/03/2023 0538   BASOSABS 0.0 11/08/2020 1509    ASSESSMENT AND PLAN:  Assessment and Plan Assessment & Plan Post-fall complications Hospitalized  for fall-related dehydration, weakness, and rhabdomyolysis. Electrolyte imbalances included hypokalemia and hypomagnesemia. Declined home health services due to financial constraints. Lives alone with a history of falls, considering safer living arrangements. - Arrange caseworker to investigate Medicaid coverage for home health services. - Provide letter for lock key box for emergency access. - Recommend finding a one-level apartment or considering assisted living facilities.  Skin abrasions and wounds Multiple abrasions and wounds from fall requiring antibiotic treatment. - Provide antibiotic cream for daily application.  Chronic Obstructive Pulmonary Disease (COPD) On 3 liters continuous oxygen . Lacks nebulizer liquid medication and Trelegy inhaler. - Prescribe albuterol  nebulizer liquid treatments to [employer].  Hypertension Elevated blood pressure. Missed morning doses of carvedilol  and lisinopril . - Instruct to take carvedilol  and lisinopril  upon returning home. - Advise limiting sodium intake.  Type 2 Diabetes Mellitus On metformin . Ozempic  on hold. Last A1c 6.2, within target range. - Continue metformin  500 mg twice daily. - Continue to hold Ozempic .  Depression Positive depression screen. On Prozac , occasional passive suicidal thoughts, open to psychiatric evaluation. - Refer to psychiatrist for further evaluation and management.  Incontinence Reports urinary incontinence, interested in wick device. - Caseworker to investigate availability of wick device for urine collection.  General Health Maintenance Considering CAP and PACE for additional support. - Caseworker to assist with application for CAP and PACE programs.  Follow-up Requires follow-up for health issues and support services. - Caseworker to follow up on Medicaid coverage for home health services and wick device. - Ensure receipt of antibiotic cream and nebulizer liquid treatments.     There are  no diagnoses linked to this encounter.   Patient was given the opportunity to ask questions.  Patient verbalized understanding of the plan and was able to repeat key elements of the plan.   This documentation was completed using Paediatric nurse.  Any transcriptional errors are unintentional.  No orders of the defined types were placed in this encounter.    Requested Prescriptions    No prescriptions requested or ordered in this encounter    No follow-ups on file.  Barnie Louder, MD, FACP

## 2023-08-06 NOTE — Telephone Encounter (Signed)
 At the request of Dr Vicci, I met with the patient when she was in the clinic for her appointment today.  She did not have her O2 with her and she said she has O2 at home but she could not tell me if she has a POC.  She was not sure of the DME company but stated she has used [employer] in the past. I told her that I will contact [employer] to see what happened with the POC order.   We discussed having some extra help at home. She said she declined the home health services that were offered to her when she was discharged form the hospital.  She has a Medicare Advantage Plan and I explained to her that if she qualifies for home health services, there would be no cost for her.  I explained the benefits of home health RN/PT/MSW and she agreed to accept the services. I explained to her that there is no guarantee that an agency will accept the referral but we can try and she agreed.  She has no preference for home health agencies.   I gave her a letter that Dr Vicci prepared for her to give to her landlord requesting she have a lockbox put on her door for EMS.   We spoke about PCS that are covered by Medicaid.  She is dually eligible Medicare Advantage plan but it may not be for full Medicaid that would cover PCS. It many only cover her Medicare Part B premium.  She said she did not know what type of Medicaid coverage she has. I told her that I need to check on that for her too. She did state that her monthly income is about $3100.  We also discussed the PACE program as well as ALF as options to receive more care either at home or a facility.  The patient was anxious to leave.  She said she has radiation this afternoon and her sister was waiting for her in the lobby.  I explained to her that the home health SW may be able to discuss care options with her.  I asked her who the best contact would be if we are not able to reach her and need to discuss care options.  She said her brother, [name] or her son,  [name].  I explained that her mother is listed as an emergency contact and she said her mother would not be the best contact because she is quite forgetful. She also said that her sister who brought her to the appointment today just checks in on her from time to time. We then updated her DPR.

## 2023-08-07 ENCOUNTER — Encounter: Payer: Self-pay | Admitting: Internal Medicine

## 2023-08-07 ENCOUNTER — Other Ambulatory Visit: Payer: Self-pay

## 2023-08-07 ENCOUNTER — Inpatient Hospital Stay

## 2023-08-07 ENCOUNTER — Ambulatory Visit
Admission: RE | Admit: 2023-08-07 | Discharge: 2023-08-07 | Disposition: A | Discharge status: Home / Self Care | Source: Ambulatory Visit | Attending: Radiation Oncology | Admitting: Radiation Oncology

## 2023-08-07 DIAGNOSIS — C50512 Malignant neoplasm of lower-outer quadrant of left female breast: Secondary | ICD-10-CM | POA: Diagnosis not present

## 2023-08-07 DIAGNOSIS — Z170 Estrogen receptor positive status [ER+]: Secondary | ICD-10-CM | POA: Diagnosis not present

## 2023-08-07 DIAGNOSIS — Z510 Encounter for antineoplastic radiation therapy: Secondary | ICD-10-CM | POA: Diagnosis not present

## 2023-08-07 LAB — RAD ONC ARIA SESSION SUMMARY
Course Elapsed Days: 0
Plan Fractions Treated to Date: 1
Plan Prescribed Dose Per Fraction: 2.66 Gy
Plan Total Fractions Prescribed: 16
Plan Total Prescribed Dose: 42.56 Gy
Reference Point Dosage Given to Date: 2.66 Gy
Reference Point Session Dosage Given: 2.66 Gy
Session Number: 1

## 2023-08-07 NOTE — Telephone Encounter (Addendum)
 I spoke to Terri/ [employer] and she said that they removed the O2 from the patient's home in 08/2022 because the patient was adamant about having it removed from the home. She was not sure what DME company is providing patient's O2.  After reviewing the chart there is a note that pulmonary ordered the O2 from [facility]. I then called [employer] and spoke to [place]/ Customer Service and she confirmed that they provided the patient with a 5L room concentrator, a portable O2 concentrator and a back up tank on 05/07/2023. She stated that the patient has not ordered any new supplies since then.   I sent a message to the [facility] caseworkers at [facility] requesting verification of patient's Medicaid  I called [employer] : [phone] and spoke to [place] who stated they do not Medicare or insurance companies, the equipment is private pay. She said I need to check with the patient's insurance company to verify coverage and inquire about a preferred DME company.

## 2023-08-08 ENCOUNTER — Encounter (HOSPITAL_BASED_OUTPATIENT_CLINIC_OR_DEPARTMENT_OTHER): Payer: Self-pay | Admitting: Pulmonary Disease

## 2023-08-08 ENCOUNTER — Ambulatory Visit (HOSPITAL_BASED_OUTPATIENT_CLINIC_OR_DEPARTMENT_OTHER): Admitting: Pulmonary Disease

## 2023-08-08 ENCOUNTER — Other Ambulatory Visit: Payer: Self-pay

## 2023-08-08 ENCOUNTER — Encounter

## 2023-08-08 ENCOUNTER — Ambulatory Visit
Admission: RE | Admit: 2023-08-08 | Discharge: 2023-08-08 | Disposition: A | Discharge status: Home / Self Care | Source: Ambulatory Visit | Attending: Radiation Oncology | Admitting: Radiation Oncology

## 2023-08-08 DIAGNOSIS — C50512 Malignant neoplasm of lower-outer quadrant of left female breast: Secondary | ICD-10-CM | POA: Diagnosis not present

## 2023-08-08 DIAGNOSIS — Z510 Encounter for antineoplastic radiation therapy: Secondary | ICD-10-CM | POA: Diagnosis not present

## 2023-08-08 DIAGNOSIS — Z170 Estrogen receptor positive status [ER+]: Secondary | ICD-10-CM | POA: Diagnosis not present

## 2023-08-08 LAB — RAD ONC ARIA SESSION SUMMARY
Course Elapsed Days: 1
Plan Fractions Treated to Date: 2
Plan Prescribed Dose Per Fraction: 2.66 Gy
Plan Total Fractions Prescribed: 16
Plan Total Prescribed Dose: 42.56 Gy
Reference Point Dosage Given to Date: 5.32 Gy
Reference Point Session Dosage Given: 2.66 Gy
Session Number: 2

## 2023-08-08 NOTE — Telephone Encounter (Signed)
 Message received from Manon Fell, [facility] Eligibility stating that the patient does not qualify for full Medicaid, only MQB Medicaid

## 2023-08-09 ENCOUNTER — Ambulatory Visit
Admission: RE | Admit: 2023-08-09 | Discharge: 2023-08-09 | Disposition: A | Discharge status: Home / Self Care | Source: Ambulatory Visit | Attending: Radiation Oncology | Admitting: Radiation Oncology

## 2023-08-09 ENCOUNTER — Other Ambulatory Visit: Payer: Self-pay

## 2023-08-09 ENCOUNTER — Encounter

## 2023-08-09 DIAGNOSIS — Z510 Encounter for antineoplastic radiation therapy: Secondary | ICD-10-CM | POA: Diagnosis not present

## 2023-08-09 DIAGNOSIS — C50512 Malignant neoplasm of lower-outer quadrant of left female breast: Secondary | ICD-10-CM

## 2023-08-09 DIAGNOSIS — Z170 Estrogen receptor positive status [ER+]: Secondary | ICD-10-CM | POA: Diagnosis not present

## 2023-08-09 LAB — RAD ONC ARIA SESSION SUMMARY
Course Elapsed Days: 2
Plan Fractions Treated to Date: 3
Plan Prescribed Dose Per Fraction: 2.66 Gy
Plan Total Fractions Prescribed: 16
Plan Total Prescribed Dose: 42.56 Gy
Reference Point Dosage Given to Date: 7.98 Gy
Reference Point Session Dosage Given: 2.66 Gy
Session Number: 3

## 2023-08-09 MED ORDER — ALRA NON-METALLIC DEODORANT (RAD-ONC)
1.0000 | Freq: Once | TOPICAL | Status: AC
  Administered 2023-08-09: 1 via TOPICAL

## 2023-08-09 MED ORDER — RADIAPLEXRX EX GEL: Freq: Once | CUTANEOUS | Status: AC

## 2023-08-12 ENCOUNTER — Other Ambulatory Visit: Payer: Self-pay

## 2023-08-12 ENCOUNTER — Ambulatory Visit
Admission: RE | Admit: 2023-08-12 | Discharge: 2023-08-12 | Disposition: A | Discharge status: Home / Self Care | Source: Ambulatory Visit | Attending: Radiation Oncology | Admitting: Radiation Oncology

## 2023-08-12 ENCOUNTER — Telehealth: Payer: Self-pay | Admitting: *Deleted

## 2023-08-12 ENCOUNTER — Inpatient Hospital Stay

## 2023-08-12 ENCOUNTER — Telehealth: Payer: Self-pay

## 2023-08-12 DIAGNOSIS — C50512 Malignant neoplasm of lower-outer quadrant of left female breast: Secondary | ICD-10-CM

## 2023-08-12 DIAGNOSIS — Z510 Encounter for antineoplastic radiation therapy: Secondary | ICD-10-CM | POA: Diagnosis not present

## 2023-08-12 DIAGNOSIS — Z170 Estrogen receptor positive status [ER+]: Secondary | ICD-10-CM | POA: Diagnosis not present

## 2023-08-12 LAB — RAD ONC ARIA SESSION SUMMARY
Course Elapsed Days: 5
Plan Fractions Treated to Date: 4
Plan Prescribed Dose Per Fraction: 2.66 Gy
Plan Total Fractions Prescribed: 16
Plan Total Prescribed Dose: 42.56 Gy
Reference Point Dosage Given to Date: 10.64 Gy
Reference Point Session Dosage Given: 2.66 Gy
Session Number: 4

## 2023-08-12 NOTE — Telephone Encounter (Signed)
 Message received from Beverley Ann, [place] stating they are not able to accept the referral due to staffing.   Message sent to Cassondra Music, [facility] requesting she review the referral.

## 2023-08-12 NOTE — Progress Notes (Signed)
 Complex Care Management Note  Care Guide Note 08/12/2023 Name: [name] MRN: [id number] DOB: 02-Dec-1955  [name] is a 68 y.o. year old female who sees Vicci Barnie NOVAK, MD for primary care. I reached out to [name] by phone today to offer complex care management services.  Ms. Krizek was given information about Complex Care Management services today including:   The Complex Care Management services include support from the care team which includes your Nurse Care Manager, Clinical Social Worker, or Pharmacist.  The Complex Care Management team is here to help remove barriers to the health concerns and goals most important to you. Complex Care Management services are voluntary, and the patient may decline or stop services at any time by request to their care team member.   Complex Care Management Consent Status: Patient did not agree to participate in complex care management services at this time.  Encounter Outcome:  Patient Refused  Thedford Franks, CMA [place]  [facility], [facility] Guide Direct Dial: [phone]  Fax: [phone] Website: [email or web address]

## 2023-08-12 NOTE — Telephone Encounter (Signed)
 Referral received for home health SN and MSW.  Per Arna Sayres, [facility], they are not in network with the patient's insurance.  Message sent to Beverley Ann, [facility] requesting he review the referral

## 2023-08-13 ENCOUNTER — Other Ambulatory Visit: Payer: Self-pay

## 2023-08-13 ENCOUNTER — Inpatient Hospital Stay

## 2023-08-13 ENCOUNTER — Ambulatory Visit
Admission: RE | Admit: 2023-08-13 | Discharge: 2023-08-13 | Disposition: A | Discharge status: Home / Self Care | Source: Ambulatory Visit | Attending: Radiation Oncology | Admitting: Radiation Oncology

## 2023-08-13 DIAGNOSIS — Z170 Estrogen receptor positive status [ER+]: Secondary | ICD-10-CM | POA: Diagnosis not present

## 2023-08-13 DIAGNOSIS — C50512 Malignant neoplasm of lower-outer quadrant of left female breast: Secondary | ICD-10-CM | POA: Diagnosis not present

## 2023-08-13 DIAGNOSIS — Z510 Encounter for antineoplastic radiation therapy: Secondary | ICD-10-CM | POA: Diagnosis not present

## 2023-08-13 LAB — RAD ONC ARIA SESSION SUMMARY
Course Elapsed Days: 6
Plan Fractions Treated to Date: 5
Plan Prescribed Dose Per Fraction: 2.66 Gy
Plan Total Fractions Prescribed: 16
Plan Total Prescribed Dose: 42.56 Gy
Reference Point Dosage Given to Date: 13.3 Gy
Reference Point Session Dosage Given: 2.66 Gy
Session Number: 5

## 2023-08-13 NOTE — Telephone Encounter (Signed)
 Messages sent to the following agencies requesting they review the referral and let me know if they are able to accept:  [employer] [facility]/ [facility]/ [facility]

## 2023-08-13 NOTE — Telephone Encounter (Signed)
 I spoke to [name], [facility] and she said they are not in network with patient's insurance.

## 2023-08-13 NOTE — Telephone Encounter (Signed)
 Message received from [facility] [place], [place] is not able to accept the referral due to staffing.

## 2023-08-14 ENCOUNTER — Inpatient Hospital Stay

## 2023-08-14 ENCOUNTER — Ambulatory Visit
Admission: RE | Admit: 2023-08-14 | Discharge: 2023-08-14 | Disposition: A | Discharge status: Home / Self Care | Source: Ambulatory Visit | Attending: Radiation Oncology

## 2023-08-14 ENCOUNTER — Other Ambulatory Visit: Payer: Self-pay

## 2023-08-14 DIAGNOSIS — C50512 Malignant neoplasm of lower-outer quadrant of left female breast: Secondary | ICD-10-CM | POA: Diagnosis not present

## 2023-08-14 DIAGNOSIS — Z510 Encounter for antineoplastic radiation therapy: Secondary | ICD-10-CM | POA: Diagnosis not present

## 2023-08-14 DIAGNOSIS — Z170 Estrogen receptor positive status [ER+]: Secondary | ICD-10-CM | POA: Diagnosis not present

## 2023-08-14 LAB — RAD ONC ARIA SESSION SUMMARY
Course Elapsed Days: 7
Plan Fractions Treated to Date: 6
Plan Prescribed Dose Per Fraction: 2.66 Gy
Plan Total Fractions Prescribed: 16
Plan Total Prescribed Dose: 42.56 Gy
Reference Point Dosage Given to Date: 15.96 Gy
Reference Point Session Dosage Given: 2.66 Gy
Session Number: 6

## 2023-08-14 NOTE — Telephone Encounter (Signed)
 I called member service with [employer]: [phone] and spoke to [place], an Administrator, arts.  She explained that the [facility] Collection system is not covered under E2001 or A6590.  She stated that the product would require a prior authorization. She said that if approved, she may not have a co-pay because she has active Medicaid.  I told her that she only has MQB and that only pays her Part B premium and Jenkins said that it should still be covered by Medicaid if approved by [facility]. She then said she would start a PA and put the call on hold and then we were disconnected.  Prior to calling back [employer], I wanted to see if any local DME companies supply the product and bill the patient's insurance.    I spoke to [employer] : [phone] and she said they do carry the product and bill insurance companies but she would have their urology rep, Fairy Pop, call me.  I spoke to [facility] : [phone] and she stated that they carry the product but do not bill insurance companies.  The patient would have to pay out of pocket.  Per Dewayne Savory, [phone] they don't carry the product  Per Glenys Amel, [phone] they don't carry the product.  I called [facility]: [phone] and they don't carry it.   [facility] does not bill insurance companies.  I spoke to [place], [facility]: [phone] and she thinks they carry the product but does not know if they bill insurance company.  She transferred me to [employer] who said she can't answer that question.

## 2023-08-14 NOTE — Telephone Encounter (Signed)
 Message sent to [facility] [place] requesting she review the referral.  I spoke to [facility] and they are not able to accept the referral due to staffing.  I also spoke to [facility]/ [facility] and she requested the referral be faxed to them for review: [phone] and it was faxed as requested.   I spoke to [facility] and she said they should be able to accept this referral and she asked this it be faxed for review: [phone] and the referral was faxed as requested

## 2023-08-14 NOTE — Telephone Encounter (Signed)
 Message received from Angie Coley, [employer] [facility] stating they are not able to accept the referral

## 2023-08-15 ENCOUNTER — Other Ambulatory Visit: Payer: Self-pay

## 2023-08-15 ENCOUNTER — Ambulatory Visit
Admission: RE | Admit: 2023-08-15 | Discharge: 2023-08-15 | Disposition: A | Discharge status: Home / Self Care | Source: Ambulatory Visit | Attending: Radiation Oncology

## 2023-08-15 DIAGNOSIS — Z510 Encounter for antineoplastic radiation therapy: Secondary | ICD-10-CM | POA: Diagnosis not present

## 2023-08-15 DIAGNOSIS — Z170 Estrogen receptor positive status [ER+]: Secondary | ICD-10-CM | POA: Diagnosis not present

## 2023-08-15 DIAGNOSIS — C50512 Malignant neoplasm of lower-outer quadrant of left female breast: Secondary | ICD-10-CM | POA: Diagnosis not present

## 2023-08-15 LAB — RAD ONC ARIA SESSION SUMMARY
Course Elapsed Days: 8
Plan Fractions Treated to Date: 7
Plan Prescribed Dose Per Fraction: 2.66 Gy
Plan Total Fractions Prescribed: 16
Plan Total Prescribed Dose: 42.56 Gy
Reference Point Dosage Given to Date: 18.62 Gy
Reference Point Session Dosage Given: 2.66 Gy
Session Number: 7

## 2023-08-15 NOTE — Telephone Encounter (Signed)
 I spoke to [place], [place] and she stated that they have a 48 hour turn around time for referral review, so the decision to accept is still pending.  She said they will call this office with a decision.  I spoke to [facility], [place] and they are declining the referral due to staffing   I called [facility] and had to leave a message for [facility] requesting a call back  Need to inquire if they would be able to accept the referral.

## 2023-08-15 NOTE — Telephone Encounter (Signed)
 [name], CMA submitted order for the [employer] to [employer] via [place]

## 2023-08-15 NOTE — Telephone Encounter (Signed)
 I called the patient to let her know that we are still looking for a home health agency to provide the SN and MSW.    I also told her that we submitted the order for the [facility] to [facility] and they will need to verify her insurance coverage for the machine. I told her that they may be calling her for more information.  Dr Vicci mentioned that the patient is interested in a [employer] monitor.  When I mentioned this to the patient, she said she already has one.

## 2023-08-16 ENCOUNTER — Other Ambulatory Visit: Payer: Self-pay

## 2023-08-16 ENCOUNTER — Ambulatory Visit
Admission: RE | Admit: 2023-08-16 | Discharge: 2023-08-16 | Disposition: A | Discharge status: Home / Self Care | Source: Ambulatory Visit | Attending: Radiation Oncology | Admitting: Radiation Oncology

## 2023-08-16 DIAGNOSIS — C50512 Malignant neoplasm of lower-outer quadrant of left female breast: Secondary | ICD-10-CM | POA: Insufficient documentation

## 2023-08-16 DIAGNOSIS — Z170 Estrogen receptor positive status [ER+]: Secondary | ICD-10-CM | POA: Diagnosis not present

## 2023-08-16 DIAGNOSIS — Z510 Encounter for antineoplastic radiation therapy: Secondary | ICD-10-CM | POA: Diagnosis not present

## 2023-08-16 LAB — RAD ONC ARIA SESSION SUMMARY
Course Elapsed Days: 9
Plan Fractions Treated to Date: 8
Plan Prescribed Dose Per Fraction: 2.66 Gy
Plan Total Fractions Prescribed: 16
Plan Total Prescribed Dose: 42.56 Gy
Reference Point Dosage Given to Date: 21.28 Gy
Reference Point Session Dosage Given: 2.66 Gy
Session Number: 8

## 2023-08-19 ENCOUNTER — Ambulatory Visit: Admission: RE | Admit: 2023-08-19 | Source: Ambulatory Visit

## 2023-08-19 ENCOUNTER — Telehealth: Payer: Self-pay | Admitting: Internal Medicine

## 2023-08-19 NOTE — Telephone Encounter (Signed)
 I spoke to [place], [facility] and they have accepted the referral.

## 2023-08-19 NOTE — Telephone Encounter (Signed)
 Copied from CRM [id number]. Topic: Clinical - Home Health Verbal Orders >> Aug 19, 2023  9:01 AM Estrellita CROME wrote:  Caller/Agency: Belvie with [facility]  Callback Number: [phone] secured voicemail.  Service Requested: Skilled Nursing Frequency: Change start of care date per patient request to this Thursday 8/7.  Any new concerns about the patient? No

## 2023-08-19 NOTE — Telephone Encounter (Signed)
 I spoke to Belvie [facility] and authorized the start of care for 08/22/2023.  He said that the patient is not sure she wants the services at this time because she is receiving radiation daily.  He said the plan at this time is to call the patient on Thurs 8/7.

## 2023-08-20 ENCOUNTER — Ambulatory Visit
Admission: RE | Admit: 2023-08-20 | Discharge: 2023-08-20 | Disposition: A | Discharge status: Home / Self Care | Source: Ambulatory Visit | Attending: Radiation Oncology | Admitting: Radiation Oncology

## 2023-08-20 ENCOUNTER — Other Ambulatory Visit: Payer: Self-pay

## 2023-08-20 DIAGNOSIS — Z510 Encounter for antineoplastic radiation therapy: Secondary | ICD-10-CM | POA: Diagnosis not present

## 2023-08-20 DIAGNOSIS — Z170 Estrogen receptor positive status [ER+]: Secondary | ICD-10-CM | POA: Diagnosis not present

## 2023-08-20 DIAGNOSIS — C50512 Malignant neoplasm of lower-outer quadrant of left female breast: Secondary | ICD-10-CM | POA: Diagnosis not present

## 2023-08-20 LAB — RAD ONC ARIA SESSION SUMMARY
Course Elapsed Days: 13
Plan Fractions Treated to Date: 9
Plan Prescribed Dose Per Fraction: 2.66 Gy
Plan Total Fractions Prescribed: 16
Plan Total Prescribed Dose: 42.56 Gy
Reference Point Dosage Given to Date: 23.94 Gy
Reference Point Session Dosage Given: 2.66 Gy
Session Number: 9

## 2023-08-21 ENCOUNTER — Ambulatory Visit
Admission: RE | Admit: 2023-08-21 | Discharge: 2023-08-21 | Disposition: A | Discharge status: Home / Self Care | Source: Ambulatory Visit | Attending: Radiation Oncology

## 2023-08-21 ENCOUNTER — Other Ambulatory Visit: Payer: Self-pay

## 2023-08-21 DIAGNOSIS — Z510 Encounter for antineoplastic radiation therapy: Secondary | ICD-10-CM | POA: Diagnosis not present

## 2023-08-21 DIAGNOSIS — C50512 Malignant neoplasm of lower-outer quadrant of left female breast: Secondary | ICD-10-CM | POA: Diagnosis not present

## 2023-08-21 DIAGNOSIS — Z170 Estrogen receptor positive status [ER+]: Secondary | ICD-10-CM | POA: Diagnosis not present

## 2023-08-21 LAB — RAD ONC ARIA SESSION SUMMARY
Course Elapsed Days: 14
Plan Fractions Treated to Date: 10
Plan Prescribed Dose Per Fraction: 2.66 Gy
Plan Total Fractions Prescribed: 16
Plan Total Prescribed Dose: 42.56 Gy
Reference Point Dosage Given to Date: 26.6 Gy
Reference Point Session Dosage Given: 2.66 Gy
Session Number: 10

## 2023-08-22 ENCOUNTER — Other Ambulatory Visit: Payer: Self-pay

## 2023-08-22 ENCOUNTER — Ambulatory Visit
Admission: RE | Admit: 2023-08-22 | Discharge: 2023-08-22 | Disposition: A | Discharge status: Home / Self Care | Source: Ambulatory Visit | Attending: Radiation Oncology | Admitting: Radiation Oncology

## 2023-08-22 ENCOUNTER — Ambulatory Visit
Admission: RE | Admit: 2023-08-22 | Discharge: 2023-08-22 | Discharge status: Home / Self Care | Source: Ambulatory Visit | Attending: Radiation Oncology

## 2023-08-22 DIAGNOSIS — Z170 Estrogen receptor positive status [ER+]: Secondary | ICD-10-CM | POA: Diagnosis not present

## 2023-08-22 DIAGNOSIS — M6282 Rhabdomyolysis: Secondary | ICD-10-CM | POA: Diagnosis not present

## 2023-08-22 DIAGNOSIS — E1159 Type 2 diabetes mellitus with other circulatory complications: Secondary | ICD-10-CM | POA: Diagnosis not present

## 2023-08-22 DIAGNOSIS — Z7951 Long term (current) use of inhaled steroids: Secondary | ICD-10-CM | POA: Diagnosis not present

## 2023-08-22 DIAGNOSIS — Z556 Problems related to health literacy: Secondary | ICD-10-CM | POA: Diagnosis not present

## 2023-08-22 DIAGNOSIS — J432 Centrilobular emphysema: Secondary | ICD-10-CM | POA: Diagnosis not present

## 2023-08-22 DIAGNOSIS — J449 Chronic obstructive pulmonary disease, unspecified: Secondary | ICD-10-CM | POA: Diagnosis not present

## 2023-08-22 DIAGNOSIS — Z7982 Long term (current) use of aspirin: Secondary | ICD-10-CM | POA: Diagnosis not present

## 2023-08-22 DIAGNOSIS — Z87891 Personal history of nicotine dependence: Secondary | ICD-10-CM | POA: Diagnosis not present

## 2023-08-22 DIAGNOSIS — Z7984 Long term (current) use of oral hypoglycemic drugs: Secondary | ICD-10-CM | POA: Diagnosis not present

## 2023-08-22 DIAGNOSIS — E1169 Type 2 diabetes mellitus with other specified complication: Secondary | ICD-10-CM | POA: Diagnosis not present

## 2023-08-22 DIAGNOSIS — C50512 Malignant neoplasm of lower-outer quadrant of left female breast: Secondary | ICD-10-CM | POA: Diagnosis not present

## 2023-08-22 DIAGNOSIS — K589 Irritable bowel syndrome without diarrhea: Secondary | ICD-10-CM | POA: Diagnosis not present

## 2023-08-22 DIAGNOSIS — Z510 Encounter for antineoplastic radiation therapy: Secondary | ICD-10-CM | POA: Diagnosis not present

## 2023-08-22 DIAGNOSIS — G4733 Obstructive sleep apnea (adult) (pediatric): Secondary | ICD-10-CM | POA: Diagnosis not present

## 2023-08-22 DIAGNOSIS — Z7985 Long-term (current) use of injectable non-insulin antidiabetic drugs: Secondary | ICD-10-CM | POA: Diagnosis not present

## 2023-08-22 DIAGNOSIS — R911 Solitary pulmonary nodule: Secondary | ICD-10-CM | POA: Diagnosis not present

## 2023-08-22 DIAGNOSIS — I152 Hypertension secondary to endocrine disorders: Secondary | ICD-10-CM | POA: Diagnosis not present

## 2023-08-22 DIAGNOSIS — J9611 Chronic respiratory failure with hypoxia: Secondary | ICD-10-CM | POA: Diagnosis not present

## 2023-08-22 DIAGNOSIS — Z9981 Dependence on supplemental oxygen: Secondary | ICD-10-CM | POA: Diagnosis not present

## 2023-08-22 DIAGNOSIS — E785 Hyperlipidemia, unspecified: Secondary | ICD-10-CM | POA: Diagnosis not present

## 2023-08-22 DIAGNOSIS — Z604 Social exclusion and rejection: Secondary | ICD-10-CM | POA: Diagnosis not present

## 2023-08-22 LAB — RAD ONC ARIA SESSION SUMMARY
Course Elapsed Days: 15
Plan Fractions Treated to Date: 11
Plan Prescribed Dose Per Fraction: 2.66 Gy
Plan Total Fractions Prescribed: 16
Plan Total Prescribed Dose: 42.56 Gy
Reference Point Dosage Given to Date: 29.26 Gy
Reference Point Session Dosage Given: 2.66 Gy
Session Number: 11

## 2023-08-23 ENCOUNTER — Ambulatory Visit: Admitting: Radiation Oncology

## 2023-08-23 ENCOUNTER — Other Ambulatory Visit: Payer: Self-pay

## 2023-08-23 ENCOUNTER — Ambulatory Visit
Admission: RE | Admit: 2023-08-23 | Discharge: 2023-08-23 | Disposition: A | Discharge status: Home / Self Care | Source: Ambulatory Visit | Attending: Radiation Oncology | Admitting: Radiation Oncology

## 2023-08-23 ENCOUNTER — Ambulatory Visit

## 2023-08-23 ENCOUNTER — Other Ambulatory Visit (HOSPITAL_COMMUNITY): Payer: Self-pay

## 2023-08-23 DIAGNOSIS — Z510 Encounter for antineoplastic radiation therapy: Secondary | ICD-10-CM | POA: Diagnosis not present

## 2023-08-23 DIAGNOSIS — C50512 Malignant neoplasm of lower-outer quadrant of left female breast: Secondary | ICD-10-CM | POA: Diagnosis not present

## 2023-08-23 DIAGNOSIS — Z170 Estrogen receptor positive status [ER+]: Secondary | ICD-10-CM | POA: Diagnosis not present

## 2023-08-23 LAB — RAD ONC ARIA SESSION SUMMARY
Course Elapsed Days: 16
Plan Fractions Treated to Date: 12
Plan Prescribed Dose Per Fraction: 2.66 Gy
Plan Total Fractions Prescribed: 16
Plan Total Prescribed Dose: 42.56 Gy
Reference Point Dosage Given to Date: 31.92 Gy
Reference Point Session Dosage Given: 2.66 Gy
Session Number: 12

## 2023-08-26 ENCOUNTER — Ambulatory Visit

## 2023-08-26 ENCOUNTER — Other Ambulatory Visit: Payer: Self-pay

## 2023-08-26 ENCOUNTER — Encounter: Payer: Self-pay | Admitting: Pharmacist

## 2023-08-27 ENCOUNTER — Other Ambulatory Visit: Payer: Self-pay

## 2023-08-27 ENCOUNTER — Ambulatory Visit: Admitting: Radiation Oncology

## 2023-08-27 ENCOUNTER — Ambulatory Visit
Admission: RE | Admit: 2023-08-27 | Discharge: 2023-08-27 | Disposition: A | Discharge status: Home / Self Care | Source: Ambulatory Visit | Attending: Radiation Oncology

## 2023-08-27 DIAGNOSIS — Z170 Estrogen receptor positive status [ER+]: Secondary | ICD-10-CM | POA: Diagnosis not present

## 2023-08-27 DIAGNOSIS — C50512 Malignant neoplasm of lower-outer quadrant of left female breast: Secondary | ICD-10-CM | POA: Diagnosis not present

## 2023-08-27 DIAGNOSIS — Z510 Encounter for antineoplastic radiation therapy: Secondary | ICD-10-CM | POA: Diagnosis not present

## 2023-08-27 LAB — RAD ONC ARIA SESSION SUMMARY
Course Elapsed Days: 20
Plan Fractions Treated to Date: 13
Plan Prescribed Dose Per Fraction: 2.66 Gy
Plan Total Fractions Prescribed: 16
Plan Total Prescribed Dose: 42.56 Gy
Reference Point Dosage Given to Date: 34.58 Gy
Reference Point Session Dosage Given: 2.66 Gy
Session Number: 13

## 2023-08-28 ENCOUNTER — Ambulatory Visit
Admission: RE | Admit: 2023-08-28 | Discharge: 2023-08-28 | Disposition: A | Discharge status: Home / Self Care | Source: Ambulatory Visit | Attending: Radiation Oncology | Admitting: Radiation Oncology

## 2023-08-28 ENCOUNTER — Other Ambulatory Visit: Payer: Self-pay

## 2023-08-28 ENCOUNTER — Telehealth: Payer: Self-pay

## 2023-08-28 DIAGNOSIS — Z510 Encounter for antineoplastic radiation therapy: Secondary | ICD-10-CM | POA: Diagnosis not present

## 2023-08-28 DIAGNOSIS — C50512 Malignant neoplasm of lower-outer quadrant of left female breast: Secondary | ICD-10-CM | POA: Diagnosis not present

## 2023-08-28 DIAGNOSIS — Z170 Estrogen receptor positive status [ER+]: Secondary | ICD-10-CM | POA: Diagnosis not present

## 2023-08-28 LAB — RAD ONC ARIA SESSION SUMMARY
Course Elapsed Days: 21
Plan Fractions Treated to Date: 14
Plan Prescribed Dose Per Fraction: 2.66 Gy
Plan Total Fractions Prescribed: 16
Plan Total Prescribed Dose: 42.56 Gy
Reference Point Dosage Given to Date: 37.24 Gy
Reference Point Session Dosage Given: 2.66 Gy
Session Number: 14

## 2023-08-28 NOTE — Telephone Encounter (Signed)
 Copied from CRM [id number]. Topic: Clinical - Home Health Verbal Orders >> Aug 28, 2023 11:16 AM Pinkey ORN wrote: Caller/Agency: Rexene Fent -Licence Clinical Social Worker / [facility]  Callback Number: [phone]  Service Requested: Change In Initial Social Worker Evaluation   Frequency: Per Patient Request, She's Requesting Wednesday August 20th  Any new concerns about the patient? No

## 2023-08-28 NOTE — Telephone Encounter (Signed)
 I spoke to Tinelle/ [facility] and gave authorization for the SW visit next week. She said she will pass that message on to Tara, SW

## 2023-08-29 ENCOUNTER — Ambulatory Visit
Admission: RE | Admit: 2023-08-29 | Discharge: 2023-08-29 | Disposition: A | Discharge status: Home / Self Care | Source: Ambulatory Visit | Attending: Radiation Oncology | Admitting: Radiation Oncology

## 2023-08-29 ENCOUNTER — Ambulatory Visit

## 2023-08-29 ENCOUNTER — Other Ambulatory Visit: Payer: Self-pay

## 2023-08-29 ENCOUNTER — Telehealth: Payer: Self-pay

## 2023-08-29 DIAGNOSIS — C50512 Malignant neoplasm of lower-outer quadrant of left female breast: Secondary | ICD-10-CM | POA: Diagnosis not present

## 2023-08-29 DIAGNOSIS — Z510 Encounter for antineoplastic radiation therapy: Secondary | ICD-10-CM | POA: Diagnosis not present

## 2023-08-29 DIAGNOSIS — Z170 Estrogen receptor positive status [ER+]: Secondary | ICD-10-CM | POA: Diagnosis not present

## 2023-08-29 LAB — RAD ONC ARIA SESSION SUMMARY
Course Elapsed Days: 22
Plan Fractions Treated to Date: 15
Plan Prescribed Dose Per Fraction: 2.66 Gy
Plan Total Fractions Prescribed: 16
Plan Total Prescribed Dose: 42.56 Gy
Reference Point Dosage Given to Date: 39.9 Gy
Reference Point Session Dosage Given: 2.66 Gy
Session Number: 15

## 2023-08-29 NOTE — Telephone Encounter (Signed)
 Copied from CRM #[id number]. Topic: Clinical - Home Health Verbal Orders >> Aug 29, 2023  9:47 AM Zebedee SAUNDERS wrote: Caller/Agency: [facility] per [facility] Cap Callback Number: [phone] secure line Service Requested: Physical Therapy Frequency: Reschedule evaluation for next week per pt request Any new concerns about the patient? No

## 2023-08-29 NOTE — Telephone Encounter (Signed)
 I spoke to Larnell Pop, [facility] [employer]: [phone] and he said they are not in network with patient's insurance, [facility] Medicare Dual Complete and he is not aware of another company who is in network and provides Purewicks.  He also explained that even if the DME provider is in network, it is very difficult to get the Purewicks covered unless the patient is essentially bedbound.

## 2023-08-30 ENCOUNTER — Ambulatory Visit
Admission: RE | Admit: 2023-08-30 | Discharge: 2023-08-30 | Disposition: A | Discharge status: Home / Self Care | Source: Ambulatory Visit | Attending: Radiation Oncology | Admitting: Radiation Oncology

## 2023-08-30 ENCOUNTER — Other Ambulatory Visit: Payer: Self-pay

## 2023-08-30 ENCOUNTER — Ambulatory Visit

## 2023-08-30 ENCOUNTER — Other Ambulatory Visit (HOSPITAL_COMMUNITY): Payer: Self-pay

## 2023-08-30 DIAGNOSIS — Z170 Estrogen receptor positive status [ER+]: Secondary | ICD-10-CM | POA: Diagnosis not present

## 2023-08-30 DIAGNOSIS — Z510 Encounter for antineoplastic radiation therapy: Secondary | ICD-10-CM | POA: Diagnosis not present

## 2023-08-30 DIAGNOSIS — C50512 Malignant neoplasm of lower-outer quadrant of left female breast: Secondary | ICD-10-CM | POA: Diagnosis not present

## 2023-08-30 LAB — RAD ONC ARIA SESSION SUMMARY
Course Elapsed Days: 23
Plan Fractions Treated to Date: 16
Plan Prescribed Dose Per Fraction: 2.66 Gy
Plan Total Fractions Prescribed: 16
Plan Total Prescribed Dose: 42.56 Gy
Reference Point Dosage Given to Date: 42.56 Gy
Reference Point Session Dosage Given: 2.66 Gy
Session Number: 16

## 2023-09-01 DIAGNOSIS — J440 Chronic obstructive pulmonary disease with acute lower respiratory infection: Secondary | ICD-10-CM | POA: Diagnosis not present

## 2023-09-01 DIAGNOSIS — J449 Chronic obstructive pulmonary disease, unspecified: Secondary | ICD-10-CM | POA: Diagnosis not present

## 2023-09-02 ENCOUNTER — Ambulatory Visit
Admission: RE | Admit: 2023-09-02 | Discharge: 2023-09-02 | Disposition: A | Discharge status: Home / Self Care | Source: Ambulatory Visit | Attending: Radiation Oncology | Admitting: Radiation Oncology

## 2023-09-02 ENCOUNTER — Ambulatory Visit

## 2023-09-02 ENCOUNTER — Other Ambulatory Visit: Payer: Self-pay

## 2023-09-02 DIAGNOSIS — Z510 Encounter for antineoplastic radiation therapy: Secondary | ICD-10-CM | POA: Diagnosis not present

## 2023-09-02 DIAGNOSIS — C50512 Malignant neoplasm of lower-outer quadrant of left female breast: Secondary | ICD-10-CM | POA: Diagnosis not present

## 2023-09-02 DIAGNOSIS — Z170 Estrogen receptor positive status [ER+]: Secondary | ICD-10-CM | POA: Diagnosis not present

## 2023-09-02 LAB — RAD ONC ARIA SESSION SUMMARY
Course Elapsed Days: 26
Plan Fractions Treated to Date: 1
Plan Prescribed Dose Per Fraction: 2 Gy
Plan Total Fractions Prescribed: 4
Plan Total Prescribed Dose: 8 Gy
Reference Point Dosage Given to Date: 2 Gy
Reference Point Session Dosage Given: 2 Gy
Session Number: 17

## 2023-09-02 NOTE — Telephone Encounter (Signed)
 Call placed to Almira, PT/ [facility] and left voicemail message providing authorization for visit next week, per patient request. I left my call back number if Almira has any questions.

## 2023-09-03 ENCOUNTER — Ambulatory Visit
Admission: RE | Admit: 2023-09-03 | Discharge: 2023-09-03 | Disposition: A | Discharge status: Home / Self Care | Source: Ambulatory Visit | Attending: Radiation Oncology | Admitting: Radiation Oncology

## 2023-09-03 ENCOUNTER — Ambulatory Visit

## 2023-09-03 ENCOUNTER — Other Ambulatory Visit: Payer: Self-pay

## 2023-09-03 DIAGNOSIS — C50512 Malignant neoplasm of lower-outer quadrant of left female breast: Secondary | ICD-10-CM | POA: Diagnosis not present

## 2023-09-03 DIAGNOSIS — Z170 Estrogen receptor positive status [ER+]: Secondary | ICD-10-CM | POA: Diagnosis not present

## 2023-09-03 LAB — RAD ONC ARIA SESSION SUMMARY
Course Elapsed Days: 27
Plan Fractions Treated to Date: 2
Plan Prescribed Dose Per Fraction: 2 Gy
Plan Total Fractions Prescribed: 4
Plan Total Prescribed Dose: 8 Gy
Reference Point Dosage Given to Date: 4 Gy
Reference Point Session Dosage Given: 2 Gy
Session Number: 18

## 2023-09-04 ENCOUNTER — Ambulatory Visit

## 2023-09-04 ENCOUNTER — Ambulatory Visit
Admission: RE | Admit: 2023-09-04 | Discharge: 2023-09-04 | Disposition: A | Discharge status: Home / Self Care | Source: Ambulatory Visit | Attending: Radiation Oncology | Admitting: Radiation Oncology

## 2023-09-04 ENCOUNTER — Other Ambulatory Visit: Payer: Self-pay

## 2023-09-04 DIAGNOSIS — R911 Solitary pulmonary nodule: Secondary | ICD-10-CM | POA: Diagnosis not present

## 2023-09-04 DIAGNOSIS — C50512 Malignant neoplasm of lower-outer quadrant of left female breast: Secondary | ICD-10-CM | POA: Diagnosis not present

## 2023-09-04 DIAGNOSIS — Z556 Problems related to health literacy: Secondary | ICD-10-CM | POA: Diagnosis not present

## 2023-09-04 DIAGNOSIS — M6282 Rhabdomyolysis: Secondary | ICD-10-CM | POA: Diagnosis not present

## 2023-09-04 DIAGNOSIS — E1159 Type 2 diabetes mellitus with other circulatory complications: Secondary | ICD-10-CM | POA: Diagnosis not present

## 2023-09-04 DIAGNOSIS — Z7982 Long term (current) use of aspirin: Secondary | ICD-10-CM | POA: Diagnosis not present

## 2023-09-04 DIAGNOSIS — Z7985 Long-term (current) use of injectable non-insulin antidiabetic drugs: Secondary | ICD-10-CM | POA: Diagnosis not present

## 2023-09-04 DIAGNOSIS — E785 Hyperlipidemia, unspecified: Secondary | ICD-10-CM | POA: Diagnosis not present

## 2023-09-04 DIAGNOSIS — Z9981 Dependence on supplemental oxygen: Secondary | ICD-10-CM | POA: Diagnosis not present

## 2023-09-04 DIAGNOSIS — J9611 Chronic respiratory failure with hypoxia: Secondary | ICD-10-CM | POA: Diagnosis not present

## 2023-09-04 DIAGNOSIS — Z7951 Long term (current) use of inhaled steroids: Secondary | ICD-10-CM | POA: Diagnosis not present

## 2023-09-04 DIAGNOSIS — Z604 Social exclusion and rejection: Secondary | ICD-10-CM | POA: Diagnosis not present

## 2023-09-04 DIAGNOSIS — E1169 Type 2 diabetes mellitus with other specified complication: Secondary | ICD-10-CM | POA: Diagnosis not present

## 2023-09-04 DIAGNOSIS — Z7984 Long term (current) use of oral hypoglycemic drugs: Secondary | ICD-10-CM | POA: Diagnosis not present

## 2023-09-04 DIAGNOSIS — I152 Hypertension secondary to endocrine disorders: Secondary | ICD-10-CM | POA: Diagnosis not present

## 2023-09-04 DIAGNOSIS — Z87891 Personal history of nicotine dependence: Secondary | ICD-10-CM | POA: Diagnosis not present

## 2023-09-04 DIAGNOSIS — Z170 Estrogen receptor positive status [ER+]: Secondary | ICD-10-CM | POA: Diagnosis not present

## 2023-09-04 DIAGNOSIS — J432 Centrilobular emphysema: Secondary | ICD-10-CM | POA: Diagnosis not present

## 2023-09-04 DIAGNOSIS — K589 Irritable bowel syndrome without diarrhea: Secondary | ICD-10-CM | POA: Diagnosis not present

## 2023-09-04 DIAGNOSIS — G4733 Obstructive sleep apnea (adult) (pediatric): Secondary | ICD-10-CM | POA: Diagnosis not present

## 2023-09-04 DIAGNOSIS — J449 Chronic obstructive pulmonary disease, unspecified: Secondary | ICD-10-CM | POA: Diagnosis not present

## 2023-09-04 LAB — RAD ONC ARIA SESSION SUMMARY
Course Elapsed Days: 28
Plan Fractions Treated to Date: 3
Plan Prescribed Dose Per Fraction: 2 Gy
Plan Total Fractions Prescribed: 4
Plan Total Prescribed Dose: 8 Gy
Reference Point Dosage Given to Date: 6 Gy
Reference Point Session Dosage Given: 2 Gy
Session Number: 19

## 2023-09-05 ENCOUNTER — Ambulatory Visit
Admission: RE | Admit: 2023-09-05 | Discharge: 2023-09-05 | Discharge status: Home / Self Care | Source: Ambulatory Visit | Attending: Radiation Oncology

## 2023-09-05 ENCOUNTER — Other Ambulatory Visit: Payer: Self-pay

## 2023-09-05 DIAGNOSIS — Z510 Encounter for antineoplastic radiation therapy: Secondary | ICD-10-CM | POA: Diagnosis not present

## 2023-09-05 DIAGNOSIS — Z170 Estrogen receptor positive status [ER+]: Secondary | ICD-10-CM | POA: Diagnosis not present

## 2023-09-05 DIAGNOSIS — C50512 Malignant neoplasm of lower-outer quadrant of left female breast: Secondary | ICD-10-CM | POA: Diagnosis not present

## 2023-09-05 LAB — RAD ONC ARIA SESSION SUMMARY
Course Elapsed Days: 29
Plan Fractions Treated to Date: 4
Plan Prescribed Dose Per Fraction: 2 Gy
Plan Total Fractions Prescribed: 4
Plan Total Prescribed Dose: 8 Gy
Reference Point Dosage Given to Date: 8 Gy
Reference Point Session Dosage Given: 2 Gy
Session Number: 20

## 2023-09-06 DIAGNOSIS — J418 Mixed simple and mucopurulent chronic bronchitis: Secondary | ICD-10-CM | POA: Diagnosis not present

## 2023-09-06 NOTE — Radiation Completion Notes (Addendum)
  Radiation Oncology         [phone] ________________________________  Name: [name] MRN: [id number]  Date of Service: 09/05/2023  DOB: 08/24/1955  End of Treatment Note   Diagnosis: Stage IA, pT1c, cN0M0, grade 1, ER/PR positive invasive ductal carcinoma of the left breast.   Intent: Curative     ==========DELIVERED PLANS==========  First Treatment Date: 2023-08-07 Last Treatment Date: 2023-09-05   Plan Name: Breast_L_BH Site: Breast, Left Technique: 3D Mode: Photon Dose Per Fraction: 2.66 Gy Prescribed Dose (Delivered / Prescribed): 42.56 Gy / 42.56 Gy Prescribed Fxs (Delivered / Prescribed): 16 / 16   Plan Name: Brst_L_Bst_BH Site: Breast, Left Technique: 3D Mode: Photon Dose Per Fraction: 2 Gy Prescribed Dose (Delivered / Prescribed): 8 Gy / 8 Gy Prescribed Fxs (Delivered / Prescribed): 4 / 4     ==========ON TREATMENT VISIT DATES========== 2023-08-09, 2023-08-16, 2023-08-22, 2023-08-30    See weekly On Treatment Notes in Epic for details in the Media tab (listed as Progress notes on the On Treatment Visit Dates listed above). The patient tolerated radiation. She developed fatigue and anticipated skin changes in the treatment field.   The patient will receive a call in about one month from the radiation oncology department. She will continue follow up with Dr. Loretha as well.      Donald KYM Husband, PAC

## 2023-09-09 DIAGNOSIS — Z7982 Long term (current) use of aspirin: Secondary | ICD-10-CM | POA: Diagnosis not present

## 2023-09-09 DIAGNOSIS — G4733 Obstructive sleep apnea (adult) (pediatric): Secondary | ICD-10-CM | POA: Diagnosis not present

## 2023-09-09 DIAGNOSIS — J9611 Chronic respiratory failure with hypoxia: Secondary | ICD-10-CM | POA: Diagnosis not present

## 2023-09-09 DIAGNOSIS — E785 Hyperlipidemia, unspecified: Secondary | ICD-10-CM | POA: Diagnosis not present

## 2023-09-09 DIAGNOSIS — Z87891 Personal history of nicotine dependence: Secondary | ICD-10-CM | POA: Diagnosis not present

## 2023-09-09 DIAGNOSIS — Z556 Problems related to health literacy: Secondary | ICD-10-CM | POA: Diagnosis not present

## 2023-09-09 DIAGNOSIS — K589 Irritable bowel syndrome without diarrhea: Secondary | ICD-10-CM | POA: Diagnosis not present

## 2023-09-09 DIAGNOSIS — E1159 Type 2 diabetes mellitus with other circulatory complications: Secondary | ICD-10-CM | POA: Diagnosis not present

## 2023-09-09 DIAGNOSIS — M6282 Rhabdomyolysis: Secondary | ICD-10-CM | POA: Diagnosis not present

## 2023-09-09 NOTE — Telephone Encounter (Unsigned)
 Copied from CRM [id number]. Topic: Clinical - Home Health Verbal Orders >> Sep 09, 2023 11:18 AM Miller H wrote: Caller/Agency: [facility] Rushing Number: [phone] Service Requested: Physical Therapy Frequency: 1 time a week for 5 weeks Any new concerns about the patient? Yes Needs clarification on oxygen  use

## 2023-09-10 NOTE — Telephone Encounter (Signed)
 Spoke with Almira order forPhysical Therapy Frequency: 1 time a week for 5 weeks   Almire's concerns about the patient's oxygen  use was clarified.  On  08/28 patient did not need o2 with ambulation

## 2023-09-12 DIAGNOSIS — Z7985 Long-term (current) use of injectable non-insulin antidiabetic drugs: Secondary | ICD-10-CM | POA: Diagnosis not present

## 2023-09-12 DIAGNOSIS — Z9981 Dependence on supplemental oxygen: Secondary | ICD-10-CM | POA: Diagnosis not present

## 2023-09-12 DIAGNOSIS — J432 Centrilobular emphysema: Secondary | ICD-10-CM | POA: Diagnosis not present

## 2023-09-12 DIAGNOSIS — G4733 Obstructive sleep apnea (adult) (pediatric): Secondary | ICD-10-CM | POA: Diagnosis not present

## 2023-09-12 DIAGNOSIS — E1169 Type 2 diabetes mellitus with other specified complication: Secondary | ICD-10-CM | POA: Diagnosis not present

## 2023-09-12 DIAGNOSIS — E785 Hyperlipidemia, unspecified: Secondary | ICD-10-CM | POA: Diagnosis not present

## 2023-09-12 DIAGNOSIS — I152 Hypertension secondary to endocrine disorders: Secondary | ICD-10-CM | POA: Diagnosis not present

## 2023-09-12 DIAGNOSIS — R911 Solitary pulmonary nodule: Secondary | ICD-10-CM | POA: Diagnosis not present

## 2023-09-12 DIAGNOSIS — Z7951 Long term (current) use of inhaled steroids: Secondary | ICD-10-CM | POA: Diagnosis not present

## 2023-09-12 DIAGNOSIS — J449 Chronic obstructive pulmonary disease, unspecified: Secondary | ICD-10-CM | POA: Diagnosis not present

## 2023-09-12 DIAGNOSIS — J9611 Chronic respiratory failure with hypoxia: Secondary | ICD-10-CM | POA: Diagnosis not present

## 2023-09-12 DIAGNOSIS — Z556 Problems related to health literacy: Secondary | ICD-10-CM | POA: Diagnosis not present

## 2023-09-12 DIAGNOSIS — Z7982 Long term (current) use of aspirin: Secondary | ICD-10-CM | POA: Diagnosis not present

## 2023-09-12 DIAGNOSIS — M6282 Rhabdomyolysis: Secondary | ICD-10-CM | POA: Diagnosis not present

## 2023-09-12 DIAGNOSIS — K589 Irritable bowel syndrome without diarrhea: Secondary | ICD-10-CM | POA: Diagnosis not present

## 2023-09-12 DIAGNOSIS — C50512 Malignant neoplasm of lower-outer quadrant of left female breast: Secondary | ICD-10-CM | POA: Diagnosis not present

## 2023-09-12 DIAGNOSIS — Z87891 Personal history of nicotine dependence: Secondary | ICD-10-CM | POA: Diagnosis not present

## 2023-09-12 DIAGNOSIS — E1159 Type 2 diabetes mellitus with other circulatory complications: Secondary | ICD-10-CM | POA: Diagnosis not present

## 2023-09-12 DIAGNOSIS — Z604 Social exclusion and rejection: Secondary | ICD-10-CM | POA: Diagnosis not present

## 2023-09-12 DIAGNOSIS — Z7984 Long term (current) use of oral hypoglycemic drugs: Secondary | ICD-10-CM | POA: Diagnosis not present

## 2023-09-13 ENCOUNTER — Telehealth: Payer: Self-pay | Admitting: Adult Health

## 2023-09-13 DIAGNOSIS — E1159 Type 2 diabetes mellitus with other circulatory complications: Secondary | ICD-10-CM | POA: Diagnosis not present

## 2023-09-13 DIAGNOSIS — Z87891 Personal history of nicotine dependence: Secondary | ICD-10-CM | POA: Diagnosis not present

## 2023-09-13 DIAGNOSIS — E785 Hyperlipidemia, unspecified: Secondary | ICD-10-CM | POA: Diagnosis not present

## 2023-09-13 DIAGNOSIS — Z7982 Long term (current) use of aspirin: Secondary | ICD-10-CM | POA: Diagnosis not present

## 2023-09-13 DIAGNOSIS — C50512 Malignant neoplasm of lower-outer quadrant of left female breast: Secondary | ICD-10-CM | POA: Diagnosis not present

## 2023-09-13 DIAGNOSIS — J432 Centrilobular emphysema: Secondary | ICD-10-CM | POA: Diagnosis not present

## 2023-09-13 DIAGNOSIS — E1169 Type 2 diabetes mellitus with other specified complication: Secondary | ICD-10-CM | POA: Diagnosis not present

## 2023-09-13 DIAGNOSIS — M6282 Rhabdomyolysis: Secondary | ICD-10-CM | POA: Diagnosis not present

## 2023-09-13 DIAGNOSIS — K589 Irritable bowel syndrome without diarrhea: Secondary | ICD-10-CM | POA: Diagnosis not present

## 2023-09-13 DIAGNOSIS — R911 Solitary pulmonary nodule: Secondary | ICD-10-CM | POA: Diagnosis not present

## 2023-09-13 DIAGNOSIS — J449 Chronic obstructive pulmonary disease, unspecified: Secondary | ICD-10-CM | POA: Diagnosis not present

## 2023-09-13 DIAGNOSIS — Z7951 Long term (current) use of inhaled steroids: Secondary | ICD-10-CM | POA: Diagnosis not present

## 2023-09-13 DIAGNOSIS — Z604 Social exclusion and rejection: Secondary | ICD-10-CM | POA: Diagnosis not present

## 2023-09-13 DIAGNOSIS — Z556 Problems related to health literacy: Secondary | ICD-10-CM | POA: Diagnosis not present

## 2023-09-13 DIAGNOSIS — Z7984 Long term (current) use of oral hypoglycemic drugs: Secondary | ICD-10-CM | POA: Diagnosis not present

## 2023-09-13 DIAGNOSIS — J9611 Chronic respiratory failure with hypoxia: Secondary | ICD-10-CM | POA: Diagnosis not present

## 2023-09-13 DIAGNOSIS — I152 Hypertension secondary to endocrine disorders: Secondary | ICD-10-CM | POA: Diagnosis not present

## 2023-09-13 DIAGNOSIS — G4733 Obstructive sleep apnea (adult) (pediatric): Secondary | ICD-10-CM | POA: Diagnosis not present

## 2023-09-13 DIAGNOSIS — Z7985 Long-term (current) use of injectable non-insulin antidiabetic drugs: Secondary | ICD-10-CM | POA: Diagnosis not present

## 2023-09-13 DIAGNOSIS — Z9981 Dependence on supplemental oxygen: Secondary | ICD-10-CM | POA: Diagnosis not present

## 2023-09-13 NOTE — Telephone Encounter (Signed)
 Called the pt ans is aware of  upcoming appt

## 2023-09-18 DIAGNOSIS — E1159 Type 2 diabetes mellitus with other circulatory complications: Secondary | ICD-10-CM | POA: Diagnosis not present

## 2023-09-18 DIAGNOSIS — Z556 Problems related to health literacy: Secondary | ICD-10-CM | POA: Diagnosis not present

## 2023-09-18 DIAGNOSIS — G4733 Obstructive sleep apnea (adult) (pediatric): Secondary | ICD-10-CM | POA: Diagnosis not present

## 2023-09-18 DIAGNOSIS — Z7982 Long term (current) use of aspirin: Secondary | ICD-10-CM | POA: Diagnosis not present

## 2023-09-18 DIAGNOSIS — E1169 Type 2 diabetes mellitus with other specified complication: Secondary | ICD-10-CM | POA: Diagnosis not present

## 2023-09-18 DIAGNOSIS — Z9981 Dependence on supplemental oxygen: Secondary | ICD-10-CM | POA: Diagnosis not present

## 2023-09-18 DIAGNOSIS — C50512 Malignant neoplasm of lower-outer quadrant of left female breast: Secondary | ICD-10-CM | POA: Diagnosis not present

## 2023-09-18 DIAGNOSIS — R911 Solitary pulmonary nodule: Secondary | ICD-10-CM | POA: Diagnosis not present

## 2023-09-18 DIAGNOSIS — J432 Centrilobular emphysema: Secondary | ICD-10-CM | POA: Diagnosis not present

## 2023-09-18 DIAGNOSIS — M6282 Rhabdomyolysis: Secondary | ICD-10-CM | POA: Diagnosis not present

## 2023-09-18 DIAGNOSIS — E785 Hyperlipidemia, unspecified: Secondary | ICD-10-CM | POA: Diagnosis not present

## 2023-09-18 DIAGNOSIS — K589 Irritable bowel syndrome without diarrhea: Secondary | ICD-10-CM | POA: Diagnosis not present

## 2023-09-18 DIAGNOSIS — J449 Chronic obstructive pulmonary disease, unspecified: Secondary | ICD-10-CM | POA: Diagnosis not present

## 2023-09-18 DIAGNOSIS — Z7951 Long term (current) use of inhaled steroids: Secondary | ICD-10-CM | POA: Diagnosis not present

## 2023-09-18 DIAGNOSIS — Z604 Social exclusion and rejection: Secondary | ICD-10-CM | POA: Diagnosis not present

## 2023-09-18 DIAGNOSIS — Z7984 Long term (current) use of oral hypoglycemic drugs: Secondary | ICD-10-CM | POA: Diagnosis not present

## 2023-09-18 DIAGNOSIS — I152 Hypertension secondary to endocrine disorders: Secondary | ICD-10-CM | POA: Diagnosis not present

## 2023-09-18 DIAGNOSIS — J9611 Chronic respiratory failure with hypoxia: Secondary | ICD-10-CM | POA: Diagnosis not present

## 2023-09-18 DIAGNOSIS — Z7985 Long-term (current) use of injectable non-insulin antidiabetic drugs: Secondary | ICD-10-CM | POA: Diagnosis not present

## 2023-09-18 DIAGNOSIS — Z87891 Personal history of nicotine dependence: Secondary | ICD-10-CM | POA: Diagnosis not present

## 2023-09-19 ENCOUNTER — Other Ambulatory Visit: Payer: Self-pay | Admitting: Internal Medicine

## 2023-09-19 DIAGNOSIS — J449 Chronic obstructive pulmonary disease, unspecified: Secondary | ICD-10-CM

## 2023-09-20 NOTE — Telephone Encounter (Signed)
 Requested medication (s) are due for refill today: yes  Requested medication (s) are on the active medication list: yes  Last refill:  02/20/23  Future visit scheduled: yes  Notes to clinic:  Medication not assigned to a protocol, review manually.      Requested Prescriptions  Pending Prescriptions Disp Refills   TRELEGY ELLIPTA  100-62.5-25 MCG/ACT AEPB [Pharmacy Med Name: Trelegy Ellipta  100-62.5-25 MCG/INH Inhalation Aerosol Powder Breath Activated] 180 each 3    Sig: USE 1 INHALATION BY MOUTH DAILY     Off-Protocol Failed - 09/20/2023  9:11 AM      Failed - Medication not assigned to a protocol, review manually.      Passed - Valid encounter within last 12 months    Recent Outpatient Visits           1 month ago Gait disturbance   [facility] [place]. [facility] Vicci Barnie NOVAK, MD   4 months ago Preop examination   [facility] Of [place]. [facility] Vicci Barnie NOVAK, MD   5 months ago Hypokalemia   [facility] [facility] Of [place]. [facility] [place], Jon M, [place]   6 months ago Type 2 diabetes mellitus with obesity [facility])   [place] [facility] Dept Of [place]. [facility] Vicci Barnie B, MD   7 months ago Type 2 diabetes mellitus with obesity [facility])    [facility] - [facility] Dept Of [place]. [facility] [place] Tonia Garnette LITTIE, RPH-CPP       Future Appointments             In 2 weeks Vicci Barnie NOVAK, MD [facility] [place] - A Dept Of [place]. [facility], [place]

## 2023-09-21 DIAGNOSIS — I152 Hypertension secondary to endocrine disorders: Secondary | ICD-10-CM | POA: Diagnosis not present

## 2023-09-21 DIAGNOSIS — J9611 Chronic respiratory failure with hypoxia: Secondary | ICD-10-CM | POA: Diagnosis not present

## 2023-09-21 DIAGNOSIS — Z7985 Long-term (current) use of injectable non-insulin antidiabetic drugs: Secondary | ICD-10-CM | POA: Diagnosis not present

## 2023-09-21 DIAGNOSIS — Z7984 Long term (current) use of oral hypoglycemic drugs: Secondary | ICD-10-CM | POA: Diagnosis not present

## 2023-09-21 DIAGNOSIS — Z556 Problems related to health literacy: Secondary | ICD-10-CM | POA: Diagnosis not present

## 2023-09-21 DIAGNOSIS — Z7982 Long term (current) use of aspirin: Secondary | ICD-10-CM | POA: Diagnosis not present

## 2023-09-21 DIAGNOSIS — Z87891 Personal history of nicotine dependence: Secondary | ICD-10-CM | POA: Diagnosis not present

## 2023-09-21 DIAGNOSIS — Z604 Social exclusion and rejection: Secondary | ICD-10-CM | POA: Diagnosis not present

## 2023-09-21 DIAGNOSIS — Z9981 Dependence on supplemental oxygen: Secondary | ICD-10-CM | POA: Diagnosis not present

## 2023-09-21 DIAGNOSIS — J432 Centrilobular emphysema: Secondary | ICD-10-CM | POA: Diagnosis not present

## 2023-09-21 DIAGNOSIS — M6282 Rhabdomyolysis: Secondary | ICD-10-CM | POA: Diagnosis not present

## 2023-09-21 DIAGNOSIS — E785 Hyperlipidemia, unspecified: Secondary | ICD-10-CM | POA: Diagnosis not present

## 2023-09-21 DIAGNOSIS — J449 Chronic obstructive pulmonary disease, unspecified: Secondary | ICD-10-CM | POA: Diagnosis not present

## 2023-09-21 DIAGNOSIS — E1159 Type 2 diabetes mellitus with other circulatory complications: Secondary | ICD-10-CM | POA: Diagnosis not present

## 2023-09-21 DIAGNOSIS — K589 Irritable bowel syndrome without diarrhea: Secondary | ICD-10-CM | POA: Diagnosis not present

## 2023-09-21 DIAGNOSIS — R911 Solitary pulmonary nodule: Secondary | ICD-10-CM | POA: Diagnosis not present

## 2023-09-21 DIAGNOSIS — C50512 Malignant neoplasm of lower-outer quadrant of left female breast: Secondary | ICD-10-CM | POA: Diagnosis not present

## 2023-09-21 DIAGNOSIS — E1169 Type 2 diabetes mellitus with other specified complication: Secondary | ICD-10-CM | POA: Diagnosis not present

## 2023-09-21 DIAGNOSIS — Z7951 Long term (current) use of inhaled steroids: Secondary | ICD-10-CM | POA: Diagnosis not present

## 2023-09-21 DIAGNOSIS — G4733 Obstructive sleep apnea (adult) (pediatric): Secondary | ICD-10-CM | POA: Diagnosis not present

## 2023-09-24 DIAGNOSIS — Z7951 Long term (current) use of inhaled steroids: Secondary | ICD-10-CM | POA: Diagnosis not present

## 2023-09-24 DIAGNOSIS — I152 Hypertension secondary to endocrine disorders: Secondary | ICD-10-CM | POA: Diagnosis not present

## 2023-09-24 DIAGNOSIS — E1169 Type 2 diabetes mellitus with other specified complication: Secondary | ICD-10-CM | POA: Diagnosis not present

## 2023-09-24 DIAGNOSIS — J449 Chronic obstructive pulmonary disease, unspecified: Secondary | ICD-10-CM | POA: Diagnosis not present

## 2023-09-24 DIAGNOSIS — K589 Irritable bowel syndrome without diarrhea: Secondary | ICD-10-CM | POA: Diagnosis not present

## 2023-09-24 DIAGNOSIS — E785 Hyperlipidemia, unspecified: Secondary | ICD-10-CM | POA: Diagnosis not present

## 2023-09-24 DIAGNOSIS — Z604 Social exclusion and rejection: Secondary | ICD-10-CM | POA: Diagnosis not present

## 2023-09-24 DIAGNOSIS — J9611 Chronic respiratory failure with hypoxia: Secondary | ICD-10-CM | POA: Diagnosis not present

## 2023-09-24 DIAGNOSIS — Z9981 Dependence on supplemental oxygen: Secondary | ICD-10-CM | POA: Diagnosis not present

## 2023-09-24 DIAGNOSIS — Z7985 Long-term (current) use of injectable non-insulin antidiabetic drugs: Secondary | ICD-10-CM | POA: Diagnosis not present

## 2023-09-24 DIAGNOSIS — Z7982 Long term (current) use of aspirin: Secondary | ICD-10-CM | POA: Diagnosis not present

## 2023-09-24 DIAGNOSIS — Z87891 Personal history of nicotine dependence: Secondary | ICD-10-CM | POA: Diagnosis not present

## 2023-09-24 DIAGNOSIS — Z556 Problems related to health literacy: Secondary | ICD-10-CM | POA: Diagnosis not present

## 2023-09-24 DIAGNOSIS — C50512 Malignant neoplasm of lower-outer quadrant of left female breast: Secondary | ICD-10-CM | POA: Diagnosis not present

## 2023-09-24 DIAGNOSIS — J432 Centrilobular emphysema: Secondary | ICD-10-CM | POA: Diagnosis not present

## 2023-09-24 DIAGNOSIS — R911 Solitary pulmonary nodule: Secondary | ICD-10-CM | POA: Diagnosis not present

## 2023-09-24 DIAGNOSIS — E1159 Type 2 diabetes mellitus with other circulatory complications: Secondary | ICD-10-CM | POA: Diagnosis not present

## 2023-09-24 DIAGNOSIS — Z7984 Long term (current) use of oral hypoglycemic drugs: Secondary | ICD-10-CM | POA: Diagnosis not present

## 2023-09-24 DIAGNOSIS — M6282 Rhabdomyolysis: Secondary | ICD-10-CM | POA: Diagnosis not present

## 2023-09-24 DIAGNOSIS — G4733 Obstructive sleep apnea (adult) (pediatric): Secondary | ICD-10-CM | POA: Diagnosis not present

## 2023-09-30 DIAGNOSIS — Z7982 Long term (current) use of aspirin: Secondary | ICD-10-CM | POA: Diagnosis not present

## 2023-09-30 DIAGNOSIS — Z7984 Long term (current) use of oral hypoglycemic drugs: Secondary | ICD-10-CM | POA: Diagnosis not present

## 2023-09-30 DIAGNOSIS — J9611 Chronic respiratory failure with hypoxia: Secondary | ICD-10-CM | POA: Diagnosis not present

## 2023-09-30 DIAGNOSIS — J449 Chronic obstructive pulmonary disease, unspecified: Secondary | ICD-10-CM | POA: Diagnosis not present

## 2023-09-30 DIAGNOSIS — Z87891 Personal history of nicotine dependence: Secondary | ICD-10-CM | POA: Diagnosis not present

## 2023-09-30 DIAGNOSIS — K589 Irritable bowel syndrome without diarrhea: Secondary | ICD-10-CM | POA: Diagnosis not present

## 2023-09-30 DIAGNOSIS — E1159 Type 2 diabetes mellitus with other circulatory complications: Secondary | ICD-10-CM | POA: Diagnosis not present

## 2023-09-30 DIAGNOSIS — Z7951 Long term (current) use of inhaled steroids: Secondary | ICD-10-CM | POA: Diagnosis not present

## 2023-09-30 DIAGNOSIS — Z9981 Dependence on supplemental oxygen: Secondary | ICD-10-CM | POA: Diagnosis not present

## 2023-09-30 DIAGNOSIS — M6282 Rhabdomyolysis: Secondary | ICD-10-CM | POA: Diagnosis not present

## 2023-09-30 DIAGNOSIS — I152 Hypertension secondary to endocrine disorders: Secondary | ICD-10-CM | POA: Diagnosis not present

## 2023-09-30 DIAGNOSIS — J432 Centrilobular emphysema: Secondary | ICD-10-CM | POA: Diagnosis not present

## 2023-09-30 DIAGNOSIS — R911 Solitary pulmonary nodule: Secondary | ICD-10-CM | POA: Diagnosis not present

## 2023-09-30 DIAGNOSIS — E785 Hyperlipidemia, unspecified: Secondary | ICD-10-CM | POA: Diagnosis not present

## 2023-09-30 DIAGNOSIS — Z556 Problems related to health literacy: Secondary | ICD-10-CM | POA: Diagnosis not present

## 2023-09-30 DIAGNOSIS — Z7985 Long-term (current) use of injectable non-insulin antidiabetic drugs: Secondary | ICD-10-CM | POA: Diagnosis not present

## 2023-09-30 DIAGNOSIS — G4733 Obstructive sleep apnea (adult) (pediatric): Secondary | ICD-10-CM | POA: Diagnosis not present

## 2023-09-30 DIAGNOSIS — Z604 Social exclusion and rejection: Secondary | ICD-10-CM | POA: Diagnosis not present

## 2023-09-30 DIAGNOSIS — C50512 Malignant neoplasm of lower-outer quadrant of left female breast: Secondary | ICD-10-CM | POA: Diagnosis not present

## 2023-10-01 ENCOUNTER — Telehealth: Payer: Self-pay

## 2023-10-01 NOTE — Telephone Encounter (Signed)
 Copied from CRM [id number]. Topic: Clinical - Home Health Verbal Orders >> Oct 01, 2023 10:03 AM Montie POUR wrote: Caller/Agency: Almira with [facility] Callback Number: [phone] - She has a secured voicemail Service Requested: Skilled Nursing Frequency: Nurse needs to complete evaluation for medication management and Diabetes  Any new concerns about the patient? Yes - She is not taking her medication correctly and not checking her blood sugar

## 2023-10-02 DIAGNOSIS — J440 Chronic obstructive pulmonary disease with acute lower respiratory infection: Secondary | ICD-10-CM | POA: Diagnosis not present

## 2023-10-02 DIAGNOSIS — J449 Chronic obstructive pulmonary disease, unspecified: Secondary | ICD-10-CM | POA: Diagnosis not present

## 2023-10-02 NOTE — Telephone Encounter (Signed)
 FYI- Message received from [facility]:  She is not taking her medication correctly and not checking her blood sugar

## 2023-10-02 NOTE — Telephone Encounter (Signed)
 Call returned to [facility] and authorization for SN left on her voicemail.  My call back number was also left if she has further questions.

## 2023-10-02 NOTE — Telephone Encounter (Signed)
 I called the patient and instructed her to bring all of her medication bottles with her, not just a list of the medications.  She said she understood and would have them with her.  We also confirmed the day and time of the appointment,

## 2023-10-02 NOTE — Telephone Encounter (Signed)
 Can address when I see her next week. Ask pt to bring all meds with her.

## 2023-10-03 NOTE — Telephone Encounter (Signed)
 Per Connell Prescott, CMA, [employer] is not able to provide the [facility].  I called the patient to inform her that we have not been able to find an agency that works with her insurance company to provide the [employer].  At this time, she would need to pay out of pocket for the equipment. I was not able to leave a message for her because her voicemail was not set up

## 2023-10-04 ENCOUNTER — Telehealth: Payer: Self-pay | Admitting: Internal Medicine

## 2023-10-04 NOTE — Telephone Encounter (Signed)
 Lvm to confirm appt for 9/22

## 2023-10-05 ENCOUNTER — Other Ambulatory Visit: Payer: Self-pay | Admitting: Physician Assistant

## 2023-10-05 ENCOUNTER — Other Ambulatory Visit: Payer: Self-pay | Admitting: Internal Medicine

## 2023-10-05 DIAGNOSIS — F32A Depression, unspecified: Secondary | ICD-10-CM

## 2023-10-05 DIAGNOSIS — K5900 Constipation, unspecified: Secondary | ICD-10-CM

## 2023-10-05 DIAGNOSIS — J449 Chronic obstructive pulmonary disease, unspecified: Secondary | ICD-10-CM

## 2023-10-07 ENCOUNTER — Ambulatory Visit
Admission: RE | Admit: 2023-10-07 | Discharge: 2023-10-07 | Disposition: A | Discharge status: Home / Self Care | Source: Ambulatory Visit | Attending: Radiation Oncology | Admitting: Radiation Oncology

## 2023-10-07 ENCOUNTER — Ambulatory Visit: Attending: Internal Medicine | Admitting: Internal Medicine

## 2023-10-07 ENCOUNTER — Encounter: Payer: Self-pay | Admitting: Internal Medicine

## 2023-10-07 ENCOUNTER — Other Ambulatory Visit (HOSPITAL_COMMUNITY): Payer: Self-pay

## 2023-10-07 ENCOUNTER — Telehealth: Payer: Self-pay

## 2023-10-07 ENCOUNTER — Other Ambulatory Visit: Payer: Self-pay

## 2023-10-07 VITALS — BP 152/85 | HR 65 | Temp 97.8°F | Ht 67.0 in | Wt 177.0 lb

## 2023-10-07 DIAGNOSIS — R269 Unspecified abnormalities of gait and mobility: Secondary | ICD-10-CM | POA: Diagnosis not present

## 2023-10-07 DIAGNOSIS — E1169 Type 2 diabetes mellitus with other specified complication: Secondary | ICD-10-CM | POA: Diagnosis not present

## 2023-10-07 DIAGNOSIS — R54 Age-related physical debility: Secondary | ICD-10-CM

## 2023-10-07 DIAGNOSIS — J449 Chronic obstructive pulmonary disease, unspecified: Secondary | ICD-10-CM

## 2023-10-07 DIAGNOSIS — Z87891 Personal history of nicotine dependence: Secondary | ICD-10-CM | POA: Diagnosis not present

## 2023-10-07 DIAGNOSIS — Z9981 Dependence on supplemental oxygen: Secondary | ICD-10-CM | POA: Diagnosis not present

## 2023-10-07 DIAGNOSIS — C50512 Malignant neoplasm of lower-outer quadrant of left female breast: Secondary | ICD-10-CM | POA: Insufficient documentation

## 2023-10-07 DIAGNOSIS — Z7984 Long term (current) use of oral hypoglycemic drugs: Secondary | ICD-10-CM

## 2023-10-07 DIAGNOSIS — Z79899 Other long term (current) drug therapy: Secondary | ICD-10-CM | POA: Diagnosis not present

## 2023-10-07 DIAGNOSIS — Z170 Estrogen receptor positive status [ER+]: Secondary | ICD-10-CM | POA: Insufficient documentation

## 2023-10-07 DIAGNOSIS — I152 Hypertension secondary to endocrine disorders: Secondary | ICD-10-CM

## 2023-10-07 DIAGNOSIS — Z7189 Other specified counseling: Secondary | ICD-10-CM | POA: Diagnosis not present

## 2023-10-07 DIAGNOSIS — Z1211 Encounter for screening for malignant neoplasm of colon: Secondary | ICD-10-CM

## 2023-10-07 DIAGNOSIS — Z23 Encounter for immunization: Secondary | ICD-10-CM | POA: Diagnosis not present

## 2023-10-07 DIAGNOSIS — J9611 Chronic respiratory failure with hypoxia: Secondary | ICD-10-CM

## 2023-10-07 LAB — POCT GLYCOSYLATED HEMOGLOBIN (HGB A1C): HbA1c, POC (controlled diabetic range): 6.1 % (ref 0.0–7.0)

## 2023-10-07 LAB — GLUCOSE, POCT (MANUAL RESULT ENTRY): POC Glucose: 93 mg/dL (ref 70–99)

## 2023-10-07 MED ORDER — LISINOPRIL 5 MG PO TABS
5.0000 mg | ORAL_TABLET | Freq: Every day | ORAL | 1 refills | Status: AC
  Filled 2023-10-07: qty 90, 90d supply, fill #0

## 2023-10-07 MED ORDER — TRELEGY ELLIPTA 100-62.5-25 MCG/ACT IN AEPB: INHALATION_SPRAY | RESPIRATORY_TRACT | 0 refills | Status: DC

## 2023-10-07 MED ORDER — AMLODIPINE BESYLATE 5 MG PO TABS: 5.0000 mg | ORAL_TABLET | Freq: Every day | ORAL | Status: AC

## 2023-10-07 MED ORDER — METFORMIN HCL ER 500 MG PO TB24: 500.0000 mg | ORAL_TABLET | Freq: Every day | ORAL | Status: AC

## 2023-10-07 NOTE — Progress Notes (Signed)
 [name], female    DOB: 1955/03/18  MRN: [id number]  CC: Diabetes (DM f/u./SOB, chest pain X2-3 mo/Flu vax administered on 10/07/2023 - C.A.)   Subjective: [name] is a 68 y.o. female who presents for chronic ds management. Her concerns today include:  Patient with history of HTN, HL, COPD/chronic hypoxia on home O2, DM type II, anxiety/depression, LT breast CA s/p lumpectomy 05/2023/XRT, mild OSA not on CPAP   Discussed the use of AI scribe software for clinical note transcription with the patient, who gave verbal consent to proceed.  History of Present Illness [name] is a 68 year old female with diabetes, hypertension, and COPD who presents for follow-up of her chronic medical issues. She has her medications in bottles with her.  She has been on continuous oxygen  therapy since May, using a concentrator at home and wearing oxygen  at all times, including during sleep. She has a portable oxygen  device but forgot to bring it to the appointment. She lives in a two-story apartment but is confined to the first floor due to difficulty with stairs. She receives [facility] health services from [facility], including physical therapy once a week, but is not yet able to manage stairs. She does not have an aide and is on a fixed income, limiting her options. She confirms that she now has a lock box outside of her apartment door.  DM:  Results for orders placed or performed in visit on 10/07/23  POCT glucose (manual entry)   Collection Time: 10/07/23 10:28 AM  Result Value Ref Range   POC Glucose 93 70 - 99 mg/dl  POCT glycosylated hemoglobin (Hb A1C)   Collection Time: 10/07/23 10:29 AM  Result Value Ref Range   Hemoglobin A1C     HbA1c POC (<> result, manual entry)     HbA1c, POC (prediabetic range)     HbA1c, POC (controlled diabetic range) 6.1 0.0 - 7.0 %  For diabetes management, she is on metformin  500 mg, taking it twice daily, and uses a Libre sensor to monitor  her blood glucose levels. This is a sensor that she had left as her insurance will no longer pay for CGM. She also has a manual Accu-chek meter. Her Libre sensor readings over the past two weeks show that she is often below her target blood glucose range of 103-141 87% of the time and TIR 13% of time with no lows.[name]  HTN: from last visit post hosp dischg, she should be on Lisinopril  5 mg daily and carvedilol  12.5 mg BID. However she does not have either of these meds. Instead she has meds that she was previously taking prior to hosp in July that includes Lis/hydrochlorothiazide  20/12.5 and Norvasc  5 mg. She has not taken any of these meds for the morning.  Other meds that she has with her include Trelegy and albuterol  for COPD, and montelukast . She continues to take Nexium  for stomach issues and Prozac  for long-standing depression. She also takes Linzess  and aspirin  regularly.   She arranged her transportation to the appointment through a ride service.    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/02/2023  Hypokalemia 04/02/2023   History of COPD 04/02/2023   Irritable bowel syndrome 04/02/2023   Malignant neoplasm of lower-outer quadrant of left breast of female, estrogen receptor positive (HCC) 03/15/2023   Invasive ductal carcinoma of breast, left (HCC) 03/13/2023   Osteopenia after menopause 02/27/2023   Nondisplaced fracture of proximal phalanx of left ring finger 07/25/2021   Displaced fracture of proximal phalanx of left little finger, initial encounter for closed fracture 07/07/2021   Onychomycosis of toenail 03/22/2021   Dry skin dermatitis  03/22/2021   Non-insulin  dependent type 2 diabetes mellitus (HCC) 02/18/2021   COPD, moderate (HCC) 11/08/2020   Depression 11/08/2020   Hyperlipidemia 11/08/2020   Mild sleep apnea 10/17/2020   Centrilobular emphysema (HCC) 06/26/2014   Benign essential HTN 06/26/2014   Dyspnea 06/25/2014     Current Outpatient Medications on File Prior to Visit  Medication Sig Dispense Refill   Accu-Chek Softclix Lancets lancets Use as instructed to check blood sugars.  E11.69 100 each 12   albuterol  (PROAIR  HFA) 108 (90 Base) MCG/ACT inhaler Inhale 1-2 puffs into the lungs every 6 (six) hours as needed for wheezing or shortness of breath. 18 g 2   albuterol  (PROVENTIL ) (2.5 MG/3ML) 0.083% nebulizer solution Take 3 mLs (2.5 mg total) by nebulization every 6 (six) hours as needed for wheezing or shortness of breath. 150 mL 1   aspirin  81 MG tablet Take 81 mg by mouth daily.     atorvastatin  (LIPITOR) 80 MG tablet Take 80 mg by mouth daily.     Blood Glucose Monitoring Suppl (ACCU-CHEK GUIDE) w/Device KIT UAD to check blood sugars daily. E11.69 1 kit 0   Continuous Glucose Receiver (FREESTYLE LIBRE 2 READER) DEVI Use to check glucose continuously throughout the day. E11.69 1 each 0   esomeprazole  (NEXIUM ) 40 MG capsule Take 1 capsule (40 mg total) by mouth daily. 90 capsule 3   glucose blood (ACCU-CHEK GUIDE TEST) test strip Use as instructed to check blood sugars twice a day E11.69 100 each 12   montelukast  (SINGULAIR ) 10 MG tablet Take 1 tablet (10 mg total) by mouth at bedtime. 90 tablet 3   FLUoxetine  (PROZAC ) 40 MG capsule TAKE 1 CAPSULE BY MOUTH DAILY 100 capsule 1   linaclotide  (LINZESS ) 145 MCG CAPS capsule TAKE 1 CAPSULE BY MOUTH DAILY  BEFORE BREAKFAST 100 capsule 3   nitroGLYCERIN  (NITROSTAT ) 0.3 MG SL tablet DISSOLVE 1 TABLET UNDER THE  TONGUE EVERY 5 MINUTES AS NEEDED FOR CHEST PAIN. MAX OF 3 TABLETS IN 15 MINUTES. CALL 911 IF PAIN  PERSISTS. (Patient not taking: Reported on 10/07/2023) 100  tablet 3   Current Facility-Administered Medications on File Prior to Visit  Medication Dose Route Frequency Provider Last Rate Last Admin   Chlorhexidine  Gluconate Cloth 2 % PADS 6 each  6 each Topical Once Tsuei, Matthew, MD       And   Chlorhexidine  Gluconate Cloth 2 % PADS 6 each  6 each Topical Once Tsuei, Matthew, MD        Allergies  Allergen Reactions   Flonase [Fluticasone  Propionate] Other (See Comments)    Nose bleeds    Social History   Socioeconomic History   Marital status: Single    Spouse name: Not on file   Number of children: 3   Years of education: Not on file   Highest education level: Not on file  Occupational History   Not on file  Tobacco Use   Smoking status: Former    Current packs/day:  0.00    Average packs/day: 1 pack/day for 38.0 years (38.0 ttl pk-yrs)    Types: Cigarettes    Start date: 01/15/1970    Quit date: 01/16/2008    Years since quitting: 15.7   Smokeless tobacco: Never  Vaping Use   Vaping status: Never Used  Substance and Sexual Activity   Alcohol use: No    Alcohol/week: 0.0 standard drinks of alcohol   Drug use: No   Sexual activity: Not Currently    Birth control/protection: Abstinence  Other Topics Concern   Not on file  Social History Narrative   Not on file   Social Drivers of Health   Financial Resource Strain: Low Risk  (12/31/2022)   Overall Financial Resource Strain (CARDIA)    Difficulty of Paying Living Expenses: Not very hard  Food Insecurity: No Food Insecurity (08/02/2023)   Hunger Vital Sign    Worried About Running Out of Food in the Last Year: Never true    Ran Out of Food in the Last Year: Never true  Transportation Needs: Unmet Transportation Needs (08/02/2023)   PRAPARE - Transportation    Lack of Transportation (Medical): Yes    Lack of Transportation (Non-Medical): Yes  Physical Activity: Inactive (12/31/2022)   Exercise Vital Sign    Days of Exercise per Week: 0 days    Minutes of Exercise per  Session: 0 min  Stress: No Stress Concern Present (12/31/2022)   [employer] of Occupational Health - Occupational Stress Questionnaire    Feeling of Stress : Not at all  Social Connections: Moderately Isolated (08/02/2023)   Social Connection and Isolation Panel    Frequency of Communication with Friends and Family: More than three times a week    Frequency of Social Gatherings with Friends and Family: Never    Attends Religious Services: Never    Database administrator or Organizations: Yes    Attends Banker Meetings: Never    Marital Status: Divorced  Catering manager Violence: Not At Risk (08/02/2023)   Humiliation, Afraid, Rape, and Kick questionnaire    Fear of Current or Ex-Partner: No    Emotionally Abused: No    Physically Abused: No    Sexually Abused: No    Family History  Problem Relation Age of Onset   Allergies Mother    Allergies Daughter    Asthma Son        had as a child   BRCA 1/2 Neg Hx    Breast cancer Neg Hx     Past Surgical History:  Procedure Laterality Date   BREAST BIOPSY Left 03/11/2023   US  LT BREAST BX W LOC DEV 1ST LESION IMG BX SPEC US  GUIDE 03/11/2023 [facility]   BREAST BIOPSY Left 06/07/2023   US  LT RADIOACTIVE SEED LOC 06/07/2023 [facility]   BREAST LUMPECTOMY WITH RADIOACTIVE SEED LOCALIZATION Left 06/11/2023   Procedure: BREAST LUMPECTOMY WITH RADIOACTIVE SEED LOCALIZATION;  Surgeon: Belinda Cough, MD;  Location: [facility] OR;  Service: General;  Laterality: Left;  LEFT BREAST RADIOACTIVE SEED LOCALIZED LUMPECTOMY   TEE WITHOUT CARDIOVERSION N/A 08/24/2014   Procedure: TRANSESOPHAGEAL ECHOCARDIOGRAM (TEE);  Surgeon: Gordy Bergamo, MD;  Location: [facility] ENDOSCOPY;  Service: Cardiovascular;  Laterality: N/A;   TOTAL ABDOMINAL HYSTERECTOMY  01/15/2005    ROS: Review of Systems Negative except as stated above  PHYSICAL EXAM: BP (!) 152/85 (BP Location: Left Arm, Patient Position: Sitting, Cuff Size: Normal)   Pulse 65    Temp 97.8 F (36.6 C) (Oral)  Ht 5' 7 (1.702 m)   Wt 177 lb (80.3 kg)   SpO2 97%   BMI 27.72 kg/m   Wt Readings from Last 3 Encounters:  10/07/23 177 lb (80.3 kg)  08/06/23 184 lb (83.5 kg)  07/31/23 187 lb (84.8 kg)  ' Physical Exam  General appearance - frail appearing older AAF in NAD. She has walker with her Mental status - pt confused about her meds Chest - breath sounds mildly decrease BL. She does not have her portable O2 with her. Reading above is on RA Heart - RRR Extremities - no LE edema      Latest Ref Rng & Units 08/05/2023    6:04 AM 08/04/2023    6:48 AM 08/02/2023    5:20 AM  CMP  Glucose 70 - 99 mg/dL 886  889  888   BUN 8 - 23 mg/dL 7  12  8    Creatinine 0.44 - 1.00 mg/dL 9.48  9.38  9.39   Sodium 135 - 145 mmol/L 139  138  139   Potassium 3.5 - 5.1 mmol/L 3.6  3.6  3.8   Chloride 98 - 111 mmol/L 107  106  108   CO2 22 - 32 mmol/L 23  24  21    Calcium  8.9 - 10.3 mg/dL 9.9  89.8  89.9    Lipid Panel     Component Value Date/Time   CHOL 108 08/02/2023 0519   CHOL 166 03/29/2021 0000   TRIG 49 08/02/2023 0519   HDL 41 08/02/2023 0519   HDL 51 03/29/2021 0000   CHOLHDL 2.6 08/02/2023 0519   VLDL 10 08/02/2023 0519   LDLCALC 57 08/02/2023 0519   LDLCALC 94 01/29/2022 1403    CBC    Component Value Date/Time   WBC 6.1 08/03/2023 0538   RBC 4.23 08/03/2023 0538   HGB 12.7 08/03/2023 0538   HGB 13.9 11/08/2020 1509   HCT 39.0 08/03/2023 0538   HCT 41.8 11/08/2020 1509   PLT 164 08/03/2023 0538   PLT 217 11/08/2020 1509   MCV 92.2 08/03/2023 0538   MCV 90 11/08/2020 1509   MCH 30.0 08/03/2023 0538   MCHC 32.6 08/03/2023 0538   RDW 13.2 08/03/2023 0538   RDW 12.6 11/08/2020 1509   LYMPHSABS 1.7 08/03/2023 0538   LYMPHSABS 1.8 11/08/2020 1509   MONOABS 0.6 08/03/2023 0538   EOSABS 0.1 08/03/2023 0538   EOSABS 0.1 11/08/2020 1509   BASOSABS 0.0 08/03/2023 0538   BASOSABS 0.0 11/08/2020 1509    ASSESSMENT AND PLAN: 1. Encounter for  medication review and counseling Med reconciliation completed Will have my CMA call [facility] and tell them to cancel all remaining Rfs on Lis/hydrochlorothiazide .   2. Hypertension associated with type 2 diabetes mellitus (HCC) (Primary) Not at goal. I did a med reconciliation today. Pt to stop Lis/hydrochlorothiazide  (NO placed on bottle).  Coreg  taken off med list. Did not have this bottle with her -Continue Norvasc  5 mg daily -Add Lisinopril  5 mg daily  3. Type 2 diabetes mellitus with other specified complication, without long-term current use of insulin  (HCC) At goal. Decrease Metformin  to 500 mg daily - POCT glycosylated hemoglobin (Hb A1C) - POCT glucose (manual entry) - Microalbumin / creatinine urine ratio - Ambulatory referral to Ophthalmology  4. Chronic obstructive pulmonary disease, unspecified COPD type (HCC) Continue Trelegy  5. Chronic hypoxic respiratory failure, on home oxygen  therapy (HCC) Continue home O2  6. Gait disturbance 7. Frail elderly Receiving home P.T. Would benefit  from being in a one level apartment. Will see if SW/CW can assist with providing resources for this -pt needs a lot of support and hand holding. Limited family and social support. Memory also seems to be poor. Discuss PACE program and pt is interested. Will have CW meet with her today to discuss further.   8. Need for immunization against influenza Flu shot given today  9. Screening for colon cancer Discussed screening options. Prefers cologuard - Cologuard  I spent 41 minutes dedicated to the care of this pt today to include pre-visit review of my last note, face to face time with pt discussing dx and management including med reconciliation, time spent discussing case with our CW before pt was seen by her as well and post visit entering of orders.   Patient was given the opportunity to ask questions.  Patient verbalized understanding of the plan.  This documentation was completed using  Paediatric nurse.  Any transcriptional errors are unintentional.  Orders Placed This Encounter  Procedures   Flu vaccine HIGH DOSE PF(Fluzone Trivalent)   Microalbumin / creatinine urine ratio   Cologuard   Ambulatory referral to Ophthalmology   POCT glycosylated hemoglobin (Hb A1C)   POCT glucose (manual entry)     Requested Prescriptions   Signed Prescriptions Disp Refills   lisinopril  (ZESTRIL ) 5 MG tablet 90 tablet 1    Sig: Take 1 tablet (5 mg total) by mouth daily.   amLODipine  (NORVASC ) 5 MG tablet      Sig: Take 1 tablet (5 mg total) by mouth daily.   Fluticasone -Umeclidin-Vilant (TRELEGY ELLIPTA ) 100-62.5-25 MCG/ACT AEPB 180 each 0    Sig: 1 Puff inhaled daily   metFORMIN  (GLUCOPHAGE -XR) 500 MG 24 hr tablet      Sig: Take 1 tablet (500 mg total) by mouth daily with breakfast.    Return in about 3 months (around 01/06/2024) for Medicare Wellness Visit in 1-6   wks with CMA/LPN.  Barnie Louder, MD, FACP

## 2023-10-07 NOTE — Telephone Encounter (Signed)
 Requested Prescriptions  Pending Prescriptions Disp Refills   LINZESS  145 MCG CAPS capsule [Pharmacy Med Name: Linzess  145 MCG Oral Capsule] 90 capsule 3    Sig: TAKE 1 CAPSULE BY MOUTH DAILY  BEFORE BREAKFAST     Gastroenterology: Irritable Bowel Syndrome Passed - 10/07/2023  3:00 PM      Passed - Valid encounter within last 12 months    Recent Outpatient Visits           Today Type 2 diabetes mellitus with obesity (HCC)   [place] [facility] Dept Of [place]. [facility] Vicci Barnie NOVAK, MD   2 months ago Gait disturbance   [place] [facility] [place] - A Dept Of [place]. [facility] Vicci Barnie NOVAK, MD   5 months ago Preop examination   [place] [facility] [place] - A Dept Of [place]. [facility] Vicci Barnie NOVAK, MD   5 months ago Hypokalemia   [place] [facility] [place] - A Dept Of [place]. [facility], Jon M, [place]   6 months ago Type 2 diabetes mellitus with obesity [facility])   [place] [facility] - A Dept Of [place]. [facility] Vicci Barnie NOVAK, MD               FLUoxetine  (PROZAC ) 40 MG capsule [Pharmacy Med Name: FLUOXETINE   40MG   CAP] 100 capsule 1    Sig: TAKE 1 CAPSULE BY MOUTH DAILY     Psychiatry:  Antidepressants - SSRI Passed - 10/07/2023  3:00 PM      Passed - Completed PHQ-2 or PHQ-9 in the last 360 days      Passed - Valid encounter within last 6 months    Recent Outpatient Visits           Today Type 2 diabetes mellitus with obesity (HCC)   [place] [facility] - A Dept Of [place]. [facility] Vicci Barnie NOVAK, MD   2 months ago Gait disturbance   [place] [facility] [place] - A Dept Of [place]. [facility] Vicci Barnie NOVAK, MD   5 months ago Preop examination   [place] [facility] [place] - A Dept Of [place]. [facility] Vicci Barnie NOVAK, MD   5 months ago Hypokalemia   [place]  [facility] [place] - A Dept Of [place]. [facility], Jon M, [place]   6 months ago Type 2 diabetes mellitus with obesity [facility])   [place] [facility] - A Dept Of [place]. [facility] Vicci Barnie NOVAK, MD

## 2023-10-07 NOTE — Telephone Encounter (Signed)
 Copied from CRM [id number]. Topic: Clinical - Prescription Issue >> Oct 07, 2023  1:17 PM Nathanel BROCKS wrote: Reason for CRM: pt called and wanted to check to see if the [place] sensor 3 was sent in. It is suppose to go to [facility]. Also can you check on the Metformin  I dont show that it was routed to a pharmacy Please call pt and advise.

## 2023-10-07 NOTE — Telephone Encounter (Signed)
 I met with the patient when she was in the clinic today and explained to her that we were not able to find a company that provides the [employer] that would bill her insurance company, it would be an out of pocket expense for her.  She said she understood.  We then discussed having some help at home. She has Medicaid but only an MQB plan that does not cover PCS.  We also spoke about [facility] and the benefits of the all-inclusive care they provide. The patient was very interested and said she would like to know more. She stated she has not been a participant of [facility] in the past. I told her that I can contact [facility] and ask them to call her and she was in agreement. I then asked her if there is anyone else: family member/ friend, she would like me to contact to share information about the program and she said no. She confirmed her phone number and I told her that I will contact [facility] for her.    I spoke to Siobhan/ [facility]: [phone] and asked if she would call the patient to discuss their program/services and she stated she would call the patient tomorrow.  I gave Siobhan my contact number and she said she will follow up with me after she speaks to the patient.

## 2023-10-07 NOTE — Telephone Encounter (Signed)
 Requested Prescriptions  Refused Prescriptions Disp Refills   montelukast  (SINGULAIR ) 10 MG tablet [Pharmacy Med Name: Montelukast  Sodium 10 MG Oral Tablet] 90 tablet 3    Sig: TAKE 1 TABLET BY MOUTH AT  BEDTIME     Pulmonology:  Leukotriene Inhibitors Passed - 10/07/2023  3:02 PM      Passed - Valid encounter within last 12 months    Recent Outpatient Visits           Today Type 2 diabetes mellitus with obesity (HCC)   [place] [facility] - A Dept Of [place]. [facility] Abigail Barnie NOVAK, MD   2 months ago Gait disturbance   [place] [facility] [place] - A Dept Of [place]. [facility] Abigail Barnie NOVAK, MD   5 months ago Preop examination   [place] [facility] [place] - A Dept Of [place]. [facility] Abigail Barnie NOVAK, MD   5 months ago Hypokalemia   [place] [facility] [place] - A Dept Of [place]. [facility], Jon M, [place]   6 months ago Type 2 diabetes mellitus with obesity [facility])   [place] [facility] - A Dept Of [place]. [facility] Abigail Barnie NOVAK, MD               esomeprazole  (NEXIUM ) 40 MG capsule [Pharmacy Med Name: Esomeprazole  Magnesium  40 MG Oral Capsule Delayed Release] 90 capsule 3    Sig: TAKE 1 CAPSULE BY MOUTH DAILY     Gastroenterology: Proton Pump Inhibitors 2 Failed - 10/07/2023  3:02 PM      Failed - AST in normal range and within 360 days    AST  Date Value Ref Range Status  07/31/2023 85 (H) 15 - 41 U/L Final         Passed - ALT in normal range and within 360 days    ALT  Date Value Ref Range Status  07/31/2023 35 0 - 44 U/L Final         Passed - Valid encounter within last 12 months    Recent Outpatient Visits           Today Type 2 diabetes mellitus with obesity (HCC)   [place] [facility] - A Dept Of [place]. [facility] Abigail Barnie NOVAK, MD   2 months ago Gait disturbance   [place] [facility] [place] -  A Dept Of [place]. [facility] Abigail Barnie NOVAK, MD   5 months ago Preop examination   [place] [facility] [place] - A Dept Of [place]. [facility] Abigail Barnie NOVAK, MD   5 months ago Hypokalemia   [place] [facility] [place] - A Dept Of [place]. [facility], Jon M, [place]   6 months ago Type 2 diabetes mellitus with obesity [facility])    Comm Health [facility] - A Dept Of [place]. [facility] Abigail Barnie NOVAK, MD

## 2023-10-08 MED ORDER — DEXCOM G7 RECEIVER DEVI: 0 refills | Status: AC

## 2023-10-08 MED ORDER — DEXCOM G7 SENSOR MISC
11 refills | Status: DC
Start: 2023-10-08 — End: 2023-10-09

## 2023-10-08 NOTE — Telephone Encounter (Signed)
 Called & spoke to the patient. Verified name & DOB. Informed patient that it looks like her insurance does not cover the Dexcom sensors but a prescription for Dexcom sensors has been sent nonetheless. Patient expressed verbal understanding of all discussed.

## 2023-10-08 NOTE — Telephone Encounter (Signed)
 Does not look like her insurance covers the Dexcom but I will send the prescription for the sensor and receiver nonetheless.SABRA

## 2023-10-08 NOTE — Telephone Encounter (Signed)
 Called and spoke to the patient. Verified name & DOB. Inquired which sensors patient is requesting. During most recent visit patient requested to change sensors to Dexcom. Patient confirmed that she wants the Dexcom sensors sent to the pharmacy.

## 2023-10-08 NOTE — Telephone Encounter (Signed)
 Copied from CRM [id number]. Topic: Clinical - Prescription Issue >> Oct 07, 2023  1:17 PM Nathanel BROCKS wrote:  Reason for CRM: pt called and wanted to check to see if the [place] sensor 3 was sent in. It is suppose to go to [facility]. Also can you check on the Metformin  I dont show that it was routed to a pharmacy Please call pt and advise.  >> Oct 08, 2023 10:26 AM Emylou G wrote: Pls call patient.. checking status of original message for her herlene script?

## 2023-10-08 NOTE — Addendum Note (Signed)
 Addended by: VICCI SOBER B on: 10/08/2023 01:15 PM   Modules accepted: Orders

## 2023-10-09 ENCOUNTER — Other Ambulatory Visit: Payer: Self-pay | Admitting: Internal Medicine

## 2023-10-09 ENCOUNTER — Ambulatory Visit: Payer: Self-pay | Admitting: Internal Medicine

## 2023-10-09 DIAGNOSIS — E1169 Type 2 diabetes mellitus with other specified complication: Secondary | ICD-10-CM

## 2023-10-09 LAB — MICROALBUMIN / CREATININE URINE RATIO
Creatinine, Urine: 122.1 mg/dL
Microalb/Creat Ratio: 10 mg/g{creat} (ref 0–29)
Microalbumin, Urine: 12.7 ug/mL

## 2023-10-09 MED ORDER — DEXCOM G7 SENSOR MISC: 1 refills | Status: AC

## 2023-10-10 NOTE — Telephone Encounter (Signed)
 I spoke to the patient and she said [facility] contacted her and a representative is coming out to see her Mon, 10/14/2023.  Regarding her current living situation,she understands it would be better for her to be in a first floor apartment.  She said that her sister in [place] is looking for places for her in [place] and [place] and her son who lives in [place]  is looking for another place for her in [place]. She stated that her current landlord does not have any first floor apartments/ homes available. I told her that I will need to look for some resources for her and get back to her. She was very Adult nurse

## 2023-10-11 NOTE — Progress Notes (Signed)
  Radiation Oncology         [phone] ________________________________  Name: [name] MRN: [id number]  Date of Service: 10/07/2023  DOB: 1955-11-01  Post Treatment Telephone Note  Diagnosis: : Stage IA, pT1c, cN0M0, grade 1, ER/PR positive invasive ductal carcinoma of the left breast.    The patient was available for call today.   Symptoms of fatigue have not improved since completing therapy.  Symptoms of skin changes have improved since completing therapy.  The patient was encouraged to avoid sun exposure in the area of prior treatment for up to one year following radiation with either sunscreen or by the style of clothing worn in the sun.  The patient has scheduled follow up with her medical oncologist Dr. Loretha for ongoing surveillance, and was encouraged to call if she develops concerns or questions regarding radiation.    Spoke with patient 10/11/23

## 2023-10-13 ENCOUNTER — Telehealth: Payer: Self-pay | Admitting: Internal Medicine

## 2023-10-13 NOTE — Telephone Encounter (Signed)
-----   Message from Slater Diesel sent at 10/10/2023  3:50 PM EDT ----- Regarding: RE: Housing I spoke to the patient and a rep from [facility] is coming to her home on Mon, 9/29. Regarding  first floor housing, I told her that I will  need to check for resources.  Her sister in [place] is looking for places for her in [place] and [place] and her son in [place] is looking for another place for her in [place] ----- Message ----- From: Vicci Barnie NOVAK, MD Sent: 10/07/2023  10:59 PM EDT To: Slater Diesel, RN; Rolin JONETTA Kerns, LCSW Subject: Housing                                        Besides trying to get her to [facility]ACE, she also needs help getting a one level apartment. Currently has 2 levels and can not climb stairs to get to 2nd floor.

## 2023-10-14 ENCOUNTER — Telehealth: Payer: Self-pay

## 2023-10-14 NOTE — Telephone Encounter (Unsigned)
 Copied from CRM #[id number]. Topic: Clinical - Home Health Verbal Orders >> Oct 14, 2023 12:43 PM Zebedee SAUNDERS wrote: Caller/Agency: [facility] per Ted Rushing Number: [phone] secure line  Service Requested: Skilled Nursing Frequency: Evaluation Any new concerns about the patient? No

## 2023-10-14 NOTE — Telephone Encounter (Signed)
 Copied from CRM #[id number]. Topic: Clinical - Home Health Verbal Orders >> Oct 14, 2023  9:33 AM Darshell M wrote: Caller/Agency: Cecilia with [facility] Callback Number: [phone] Service Requested: Skilled Nursing Frequency: Any new concerns about the patient? No

## 2023-10-14 NOTE — Telephone Encounter (Signed)
 I called [name]/ [facility] back and had to leave a message requesting she return my call.  I did not leave any specific patient information on the voicemail because there was no identifying information with the message on the voicemail.

## 2023-10-15 ENCOUNTER — Other Ambulatory Visit: Payer: Self-pay

## 2023-10-15 NOTE — Telephone Encounter (Signed)
 Noted! Thank you

## 2023-10-15 NOTE — Telephone Encounter (Signed)
 I spoke to [place], RN/[facility] and gave her authorization for SN eval

## 2023-10-17 NOTE — Telephone Encounter (Signed)
 I called patient to inquire about her meeting with [facility] representative this week and also if her family has been able to locate a first floor residence for her and I had to leave a message requesting a call back

## 2023-10-18 ENCOUNTER — Telehealth: Payer: Self-pay

## 2023-10-18 NOTE — Telephone Encounter (Signed)
 Spoke with patient. Patient voiced that she is taking linzess  and this make her stool water and per instructions she should not collect. Call to cologuard and was advised that patient could hold off on taking linzess  as often until she has a solid stool and advised that the cologuard order is good for a year. Spoke with patient . patient voiced that she had call cologuard  as well and will just not take linzess  for a couple days. Voiced that she would try this first before requesting a colonoscopy.

## 2023-10-18 NOTE — Telephone Encounter (Signed)
 Copied from CRM [id number]. Topic: Clinical - Medical Advice >> Oct 17, 2023  1:58 PM Amy B wrote: Reason for CRM: Patient states she received her Cologuard test but cannot use the test as she does not have any firm stool.  She only has watery stools.  She requests a call back to discuss what she needs to do. >> Oct 18, 2023  2:01 PM Amy B wrote: Patient states she still has not received a call back regarding her Cologuard test and is requesting medical advice.

## 2023-10-22 ENCOUNTER — Telehealth: Payer: Self-pay

## 2023-10-22 LAB — COLOGUARD

## 2023-10-22 NOTE — Telephone Encounter (Signed)
 Copied from CRM #[id number]. Topic: General - Other >> Oct 22, 2023  5:10 PM Tobias L wrote: Reason for CRM: Ted with [facility] states they were unable to reach patient and wanting to know if okay to move evaluation for tomorrow or Thursday 10/24/23.   Requesting callback: [phone]

## 2023-10-23 ENCOUNTER — Other Ambulatory Visit: Payer: Self-pay | Admitting: Internal Medicine

## 2023-10-23 MED ORDER — ESOMEPRAZOLE MAGNESIUM 40 MG PO CPDR: 40.0000 mg | DELAYED_RELEASE_CAPSULE | Freq: Every day | ORAL | 1 refills | Status: DC

## 2023-10-23 NOTE — Telephone Encounter (Signed)
Okay by me.

## 2023-10-23 NOTE — Telephone Encounter (Signed)
 I spoke to Kingsley, RN/ [employer] and gave approval for SN evaluation as requested.

## 2023-10-24 ENCOUNTER — Telehealth: Payer: Self-pay

## 2023-10-24 NOTE — Telephone Encounter (Signed)
 Please see call from [facility]. Can you please call her back and clarify what she needs. Slater spoke with her 10/23/2023 giving the okay for her to see the pt.

## 2023-10-24 NOTE — Telephone Encounter (Signed)
 Copied from CRM [id number]. Topic: Clinical - Home Health Verbal Orders >> Oct 24, 2023  9:00 AM Pinkey ORN wrote: Caller/Agency: Lorenda [facility]  Callback Number: [phone] Service Requested: Skilled Nursing Frequency: Evaluation  Any new concerns about the patient? No >> Oct 24, 2023  9:03 AM Pinkey ORN wrote: Lorenda calling on behalf of patient to get approval for verbal orders. States prior to being given authorization on Sept 30th, she hasn't seen the patient and wanted to know if it was okay to proceed with that or receive new orders. Please follow up.

## 2023-10-25 NOTE — Telephone Encounter (Signed)
 Spoke with Cecelia . Cecelia said the message was sent before she got the ok from [place].

## 2023-10-28 DIAGNOSIS — Z1211 Encounter for screening for malignant neoplasm of colon: Secondary | ICD-10-CM | POA: Diagnosis not present

## 2023-10-30 ENCOUNTER — Telehealth: Payer: Self-pay | Admitting: Internal Medicine

## 2023-10-30 ENCOUNTER — Other Ambulatory Visit: Payer: Self-pay | Admitting: Internal Medicine

## 2023-10-30 DIAGNOSIS — J449 Chronic obstructive pulmonary disease, unspecified: Secondary | ICD-10-CM

## 2023-10-30 MED ORDER — MONTELUKAST SODIUM 10 MG PO TABS: 10.0000 mg | ORAL_TABLET | Freq: Every day | ORAL | 3 refills | Status: DC

## 2023-10-30 NOTE — Telephone Encounter (Signed)
 Copied from CRM [id number]. Topic: Clinical - Home Health Verbal Orders >> Oct 29, 2023  1:04 PM Hadassah PARAS wrote:  Caller/Agency: [facility]  Callback Number: [phone] Service Requested: Home Health Services has been trying to reach out to Pt to recertify service. Was unable to reach pt. If Pt decided on getting home health services again, they would need a new referral

## 2023-10-31 NOTE — Telephone Encounter (Signed)
 See message from [facility]  Please see if you can reach this pt to see if she still wants HH. If unable to reach her, call one of her family members to see if anyone has heard from her. If no one has heard from her maybe a wellness check needs to be done.

## 2023-11-01 DIAGNOSIS — J449 Chronic obstructive pulmonary disease, unspecified: Secondary | ICD-10-CM | POA: Diagnosis not present

## 2023-11-01 DIAGNOSIS — J440 Chronic obstructive pulmonary disease with acute lower respiratory infection: Secondary | ICD-10-CM | POA: Diagnosis not present

## 2023-11-01 NOTE — Telephone Encounter (Signed)
 Call to patient Number on chart. Number is not a working Number. Call to first contact listed Rutha Prescott, patient 's bother. Mr. Prescott is going to check on her and have patient call us  back.

## 2023-11-04 NOTE — Telephone Encounter (Signed)
 Copied from CRM [id number]. Topic: General - Other >> Nov 01, 2023  4:58 PM Delon DASEN wrote:  Reason for CRM: Patient returning call, does still want the Home Health services, has been waiting for [facility] to call about services- [phone]

## 2023-11-04 NOTE — Telephone Encounter (Signed)
 Spoke with patient. Patient has been had problems with number going through to her phone. Patient voiced that she still wants [facility] services. Advised that since she having problems with her phone she should call [facility] at [phone] and [facility] at [phone] to follow-up about her services. Patient is in agreement to do so.

## 2023-11-06 ENCOUNTER — Other Ambulatory Visit: Payer: Self-pay | Admitting: Internal Medicine

## 2023-11-06 DIAGNOSIS — J449 Chronic obstructive pulmonary disease, unspecified: Secondary | ICD-10-CM

## 2023-11-06 DIAGNOSIS — J418 Mixed simple and mucopurulent chronic bronchitis: Secondary | ICD-10-CM | POA: Diagnosis not present

## 2023-11-06 MED ORDER — MONTELUKAST SODIUM 10 MG PO TABS: 10.0000 mg | ORAL_TABLET | Freq: Every day | ORAL | 3 refills | Status: AC

## 2023-11-06 MED ORDER — ESOMEPRAZOLE MAGNESIUM 40 MG PO CPDR: 40.0000 mg | DELAYED_RELEASE_CAPSULE | Freq: Every day | ORAL | 1 refills | Status: AC

## 2023-11-07 LAB — COLOGUARD: COLOGUARD: NEGATIVE

## 2023-11-14 ENCOUNTER — Telehealth: Payer: Self-pay | Admitting: Internal Medicine

## 2023-11-14 NOTE — Telephone Encounter (Signed)
 Copied from CRM #[id number]. Topic: Clinical - Medication Question >> Nov 14, 2023 11:49 AM Charlet HERO wrote: Reason for CRM: patient is stating that she is having issue with craving sweets , she was taken off of ozempic  due to cancer treatment and she is stating that she is off now and she wanted to know if she can be placed back on the medication. Please call the pateitnt back.

## 2023-11-14 NOTE — Telephone Encounter (Signed)
 Please Advise

## 2023-11-15 ENCOUNTER — Telehealth: Payer: Self-pay

## 2023-11-15 MED ORDER — SEMAGLUTIDE(0.25 OR 0.5MG/DOS) 2 MG/3ML ~~LOC~~ SOPN: 0.2500 mg | PEN_INJECTOR | SUBCUTANEOUS | 0 refills | Status: DC

## 2023-11-15 NOTE — Addendum Note (Signed)
 Addended by: VICCI SOBER B on: 11/15/2023 12:50 PM   Modules accepted: Orders

## 2023-11-15 NOTE — Telephone Encounter (Signed)
 Call to Tan number given is a fax number unable to reach to clarity what needed.

## 2023-11-15 NOTE — Telephone Encounter (Signed)
 Rxn sent to [facility] for Ozempic  0.25 mg to take once a week. Let us  know if she develops any vomiting, abdominal pain, heart racing, severe diarrhea on the medication.

## 2023-11-15 NOTE — Telephone Encounter (Signed)
 Copied from CRM #[id number]. Topic: Clinical - Medication Prior Auth >> Nov 15, 2023  9:22 AM Willma SAUNDERS wrote: Reason for CRM: Tan for [facility] calling to see if prior authorization can be provided for the patient to get in home physical therapy.   Tan can be reached at [phone]

## 2023-11-15 NOTE — Telephone Encounter (Signed)
 Spoke with patient and advised per PCP Rxn sent to [facility] for Ozempic  0.25 mg to take once a week. Let us  know if she develops any vomiting, abdominal pain, heart racing, severe diarrhea on the medication.

## 2023-11-20 ENCOUNTER — Telehealth: Payer: Self-pay

## 2023-11-20 DIAGNOSIS — R269 Unspecified abnormalities of gait and mobility: Secondary | ICD-10-CM

## 2023-11-20 DIAGNOSIS — R54 Age-related physical debility: Secondary | ICD-10-CM

## 2023-11-20 NOTE — Telephone Encounter (Signed)
 I called the patient to inquire about the outcome of her [facility] assessment.  She said that someone came out to assess her and explained to her about  spending down $2000 and said they would be back in contact with her but she never heard from anyone.  I told her that I would call [facility] for more information.   She has [employer] Dual Complete but when I last checked her Medicaid status a month ago, she only has MQB Medicaid, not full Medicaid that would pay for PCS.   I asked her if she is interested in home health services and she said yes.  She had been receiving services from [facility] but it appears the referral was closed. I told her that I would call [facility] to confirm the status of that referral.   She may benefit from a home health MSW referral in addition to SN.  The MSW may be able to assist her with locating first floor housing.  She said her son from [place] is coming to see her later this month but she is not sure how much he will be able to help her.  She said her sister in [place] has been looking for a place for her and found a senior residence for her but the patient is not able to find her birth certificate that is needed for the application.  She said she paid $14 for a new birth certificate from [place], [place]  but has not received it yet.   I called [facility] and left a message for the Enrollment Department, requesting a call back.  I spoke to Tenielle/ [facility] Referral Coordinator and she said they would need a new referral to initiate services again because they discharged the patient.  She was pretty sure they could accept the new referral and would then need to obtain insurance authorization to begin servicing the patient again.  To qualify for home health MSW< she would need to be receiving SN and/or PT at home

## 2023-11-21 ENCOUNTER — Telehealth: Payer: Self-pay | Admitting: Internal Medicine

## 2023-11-21 NOTE — Telephone Encounter (Signed)
 I spoke to [place], [place], and she explained that they have been in contact with the patient multiple times but the impression that the patient left with them was that she did not want their services.  Espiridion said they have been to the patient's home multiple times and they would love to provide services for her but the patient needs to spend down about $2200 to qualify for Medicaid.  She can't have more than $2000 in assets to qualify.  Espiridion said that the patient provided them with bank statements and those have been returned to the patient. Shavonne said that they explained to the patient that once the $2200 is spent, she will need to provide receipts of how the money was spent.  I explained to [facility] that I will be back in touch with the patient as we are considering placing another home health referral to have MSW work with her. I also told Espiridion that I will call her if I have any more information about the patient's desire for their services.

## 2023-11-21 NOTE — Telephone Encounter (Signed)
 Copied from CRM [id number]. Topic: General - Call Back - No Documentation >> Nov 21, 2023  9:01 AM Amy B wrote:  Reason for CRM: Shavonne with [facility] returned call for Slater.  Please call her at [phone]

## 2023-11-23 NOTE — Telephone Encounter (Signed)
 I think they were suppose to see her for P.T but pt stopped answering her door so they closed the referral. I was so concern that I had Cassy call her sibling and advised going to her apartment to do a well check on her. I will resubmit new referral for home P.T

## 2023-11-25 NOTE — Telephone Encounter (Signed)
 Referral for home PT and MSW efaxed to [facility]

## 2023-11-27 NOTE — Telephone Encounter (Signed)
 I spoke to Tenille /[facility] and she confirmed receipt of the referral and stated they are just waiting on authorization from the insurance company prior to seeing the patient

## 2023-11-29 ENCOUNTER — Telehealth: Payer: Self-pay | Admitting: Internal Medicine

## 2023-11-29 NOTE — Telephone Encounter (Signed)
 Copied from CRM [id number]. Topic: General - Other >> Nov 29, 2023  9:12 AM Pinkey ORN wrote:  Reason for CRM: Attention Vicci Barnie NOVAK, MD  >> Nov 29, 2023  9:14 AM Pinkey ORN wrote:  Marval Regal [facility] [phone] Called states they received the order for social work, but at this time they don't have any social workers and wanted to know if it was okay to remove that requested service. Also if they could see that patient 12/02/23 for physical therapy.

## 2023-11-30 NOTE — Telephone Encounter (Signed)
 Okay. Thanks for letting us know.

## 2023-12-02 NOTE — Telephone Encounter (Signed)
 I spoke to Marci/ [facility] gave approval to remove MSW from the referral and to start PT today, 1117/2025.

## 2023-12-02 NOTE — Telephone Encounter (Signed)
 Noted! Thank you

## 2023-12-04 NOTE — Telephone Encounter (Signed)
 Can you check with them to inquire what is needed? Thanks.

## 2023-12-04 NOTE — Telephone Encounter (Unsigned)
 Copied from CRM [id number]. Topic: General - Other >> Dec 04, 2023 12:28 PM Zebedee SAUNDERS wrote: Reason for CRM: Received call from [facility] per Stevphen Darner ph: [phone] fax: [phone] referral sent diagnosis can not be use for insurance purposes. Please call

## 2023-12-05 NOTE — Telephone Encounter (Signed)
 Call returned to [place], [place] and message was left requesting a call back

## 2023-12-14 ENCOUNTER — Other Ambulatory Visit: Payer: Self-pay | Admitting: Internal Medicine

## 2023-12-20 ENCOUNTER — Encounter: Admitting: Adult Health

## 2023-12-27 ENCOUNTER — Ambulatory Visit: Payer: Self-pay

## 2023-12-27 ENCOUNTER — Telehealth: Payer: Self-pay | Admitting: Internal Medicine

## 2023-12-27 NOTE — Telephone Encounter (Signed)
 FYI Only or Action Required?: FYI only for provider: appointment scheduled on 01/02/24.  Patient was last seen in primary care on 10/07/2023 by Vicci Barnie NOVAK, MD.  Called Nurse Triage reporting Fall.  Symptoms began several months ago.  Interventions attempted: OTC medications: tylenol  and Rest, hydration, or home remedies.  Symptoms are: gradually worsening.  Triage Disposition: See PCP Within 2 Weeks  Patient/caregiver understands and will follow disposition?: Yes   Copied from CRM [id number]. Topic: Clinical - Red Word Triage >> Dec 27, 2023  5:19 PM Hadassah PARAS wrote: Red Word that prompted transfer to Nurse Triage: Problem sleeping, experiencing extreme back-buttocks pain, difficulty urinating, tried taking  medication like Tylenol  but it is not working. Transferred to NT Tramadol  refill request  Reason for Disposition  Back pain is a chronic symptom (recurrent or ongoing AND present > 4 weeks)  Answer Assessment - Initial Assessment Questions Pt called in with multiple concerns related to several falls, most recent fall in October that resulted in hospitalization. Pt states she fell and was unable to get up for a day before someone found her and helped her. States that since then she has had increased back pain resulting in difficulty sleeping d/t soreness. States she stays sore despite taking tylenol  and applying heat. She also stated she is unable to use stairs in her home so she is trying to find assistance in find one level home; fear of falling again. Pt reports difficulty urinating is only at night as she has hesitancy but is able to relieve herself without issues during the day. Pt also stated she wears oxygen  at night d/t congestion; reports yellow mucous and mild cough. Taking cold medication OTC. Suggested d/t several ongoing issues to schedule appt with PCP. Appointment scheduled for evaluation. Patient agrees with plan of care, and will call back if anything changes, or if  symptoms worsen.     1. ONSET: When did the pain begin? (e.g., minutes, hours, days)     Ongoing since fall and hospitalization in October   2. LOCATION: Where does it hurt? (upper, mid or lower back)     Lower back  3. SEVERITY: How bad is the pain?  (e.g., Scale 1-10; mild, moderate, or severe)     Severe when flared; stays sore   4. PATTERN: Is the pain constant? (e.g., yes, no; constant, intermittent)      Constant   5. RADIATION: Does the pain shoot into your legs or somewhere else?     Yes; hx of sarcoidosis and fibromyo  6. CAUSE:  What do you think is causing the back pain?      Unknown   7. BACK OVERUSE:  Any recent lifting of heavy objects, strenuous work or exercise?     No  8. MEDICINES: What have you taken so far for the pain? (e.g., nothing, acetaminophen , NSAIDS)     Tylenol    9. NEUROLOGIC SYMPTOMS: Do you have any weakness, numbness, or problems with bowel/bladder control?     Pt states difficulty urinating at night; hesitancy d/t fear fall   10. OTHER SYMPTOMS: Do you have any other symptoms? (e.g., fever, abdomen pain, burning with urination, blood in urine)       None  Protocols used: Back Pain-A-AH

## 2023-12-27 NOTE — Telephone Encounter (Signed)
 Unable to pend

## 2023-12-27 NOTE — Telephone Encounter (Unsigned)
 Copied from CRM #[id number]. Topic: Clinical - Medication Refill >> Dec 27, 2023  5:13 PM Hadassah PARAS wrote: Medication: Trazadone 50mg   Has the patient contacted their pharmacy? No (Agent: If no, request that the patient contact the pharmacy for the refill. If patient does not wish to contact the pharmacy document the reason why and proceed with request.) (Agent: If yes, when and what did the pharmacy advise?)  This is the patient's preferred pharmacy:    [facility] [address] [place], [place] - [address] [place] [address] Phone: [phone] Fax: [phone]      Is this the correct pharmacy for this prescription? No If no, delete pharmacy and type the correct one.   Has the prescription been filled recently? No  Is the patient out of the medication? Yes  Has the patient been seen for an appointment in the last year OR does the patient have an upcoming appointment? Yes  Can we respond through MyChart? No  Agent: Please be advised that Rx refills may take up to 3 business days. We ask that you follow-up with your pharmacy.

## 2023-12-29 ENCOUNTER — Other Ambulatory Visit: Payer: Self-pay | Admitting: Internal Medicine

## 2023-12-29 DIAGNOSIS — I10 Essential (primary) hypertension: Secondary | ICD-10-CM

## 2023-12-30 NOTE — Telephone Encounter (Signed)
 Let patient know that trazodone  is not on her current medication list.

## 2023-12-30 NOTE — Telephone Encounter (Signed)
 Patient requesting Trazadone medication.

## 2023-12-30 NOTE — Telephone Encounter (Signed)
 Patient has upcoming appointment

## 2023-12-31 NOTE — Telephone Encounter (Signed)
 Called & spoke to the patient. Verified name & DOB. Informed that Trazodone  is not on her current medication list. Patient stated that she has recently had a fall and that is why she is asking for that prescription. Informed patient that she has an upcoming appointment on 01/02/24 to address falls and will be able to discuss an appropriate medication to help her concerns. Patient expressed verbal understanding.

## 2024-01-02 ENCOUNTER — Other Ambulatory Visit: Payer: Self-pay | Admitting: Internal Medicine

## 2024-01-02 ENCOUNTER — Ambulatory Visit: Attending: Internal Medicine | Admitting: Internal Medicine

## 2024-01-02 ENCOUNTER — Ambulatory Visit: Admission: RE | Admit: 2024-01-02 | Source: Ambulatory Visit

## 2024-01-02 VITALS — BP 154/74 | HR 78 | Temp 97.8°F | Ht 67.0 in | Wt 179.0 lb

## 2024-01-02 DIAGNOSIS — E1159 Type 2 diabetes mellitus with other circulatory complications: Secondary | ICD-10-CM

## 2024-01-02 DIAGNOSIS — R296 Repeated falls: Secondary | ICD-10-CM | POA: Diagnosis not present

## 2024-01-02 DIAGNOSIS — I152 Hypertension secondary to endocrine disorders: Secondary | ICD-10-CM

## 2024-01-02 DIAGNOSIS — M25552 Pain in left hip: Secondary | ICD-10-CM

## 2024-01-02 DIAGNOSIS — R269 Unspecified abnormalities of gait and mobility: Secondary | ICD-10-CM

## 2024-01-02 DIAGNOSIS — I110 Hypertensive heart disease with heart failure: Secondary | ICD-10-CM

## 2024-01-02 MED ORDER — TRAMADOL HCL 50 MG PO TABS: 50.0000 mg | ORAL_TABLET | Freq: Two times a day (BID) | ORAL | 0 refills | Status: AC | PRN
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
## 2024-01-02 NOTE — Patient Instructions (Signed)
°  Please go to [facility] at [address] to get the x-rays done on your hip.  VISIT SUMMARY: Today, we discussed your recent fall and the resulting left hip pain. We also reviewed your history of falls, your current living situation, and your ongoing health conditions, including type 2 diabetes and hypertension.  YOUR PLAN: -RECURRENT FALLS: Your recurrent falls are likely due to frailty and the lack of proper mobility aids. We have ordered x-rays of your left hip and femur to check for any fractures. You have been prescribed tramadol  to manage the pain, and we recommend using a heating pad or warm compress on the hip area twice daily. It is important to obtain a new cane to help with your mobility. We also discussed the possibility of moving to a one-level apartment or an assisted living facility to reduce your fall risk. Home physical therapy has been ordered to help improve your strength and balance.  -ACUTE LEFT HIP PAIN: Your left hip pain, rated at 8 out of 10, is localized to your left buttock. We have ordered x-rays to rule out any fractures. You have been prescribed tramadol  for pain relief and advised to use a heating pad or warm compress on the hip area twice daily.  -GAIT DISTURBANCE: Your difficulty walking is related to frailty and your recent fall, and it is made worse by living in a multi-level apartment. We recommend obtaining a new cane and have ordered home physical therapy to help improve your mobility.  -TYPE 2 DIABETES MELLITUS WITH HYPERTENSION: Your blood pressure is elevated but slightly improved. You should continue taking your current medications, amlodipine  and lisinopril , to manage your blood pressure.  INSTRUCTIONS: Please follow up with the x-rays of your left hip and femur as soon as possible. Continue taking your prescribed medications and use the heating pad or warm compress on your hip area twice daily. Obtain a new cane to assist with your mobility.  Home physical therapy will be arranged to help improve your strength and balance. Consider discussing with your family the possibility of moving to a one-level apartment or an assisted living facility to reduce your fall risk.                      Contains text generated by Abridge.                                 Contains text generated by Abridge.

## 2024-01-03 ENCOUNTER — Ambulatory Visit: Payer: Self-pay

## 2024-01-03 ENCOUNTER — Ambulatory Visit: Payer: Self-pay | Admitting: Internal Medicine

## 2024-01-03 LAB — COMPREHENSIVE METABOLIC PANEL WITH GFR
ALT: 20 IU/L (ref 0–32)
AST: 21 IU/L (ref 0–40)
Albumin: 4.4 g/dL (ref 3.9–4.9)
Alkaline Phosphatase: 69 IU/L (ref 49–135)
BUN/Creatinine Ratio: 19 (ref 12–28)
BUN: 12 mg/dL (ref 8–27)
Bilirubin Total: 0.3 mg/dL (ref 0.0–1.2)
CO2: 24 mmol/L (ref 20–29)
Calcium: 11.7 mg/dL — ABNORMAL HIGH (ref 8.7–10.3)
Chloride: 104 mmol/L (ref 96–106)
Creatinine, Ser: 0.62 mg/dL (ref 0.57–1.00)
Globulin, Total: 2.4 g/dL (ref 1.5–4.5)
Glucose: 81 mg/dL (ref 70–99)
Potassium: 4 mmol/L (ref 3.5–5.2)
Sodium: 142 mmol/L (ref 134–144)
Total Protein: 6.8 g/dL (ref 6.0–8.5)
eGFR: 97 mL/min/1.73

## 2024-01-03 LAB — CBC
Hematocrit: 46.6 % (ref 34.0–46.6)
Hemoglobin: 15 g/dL (ref 11.1–15.9)
MCH: 30.1 pg (ref 26.6–33.0)
MCHC: 32.2 g/dL (ref 31.5–35.7)
MCV: 94 fL (ref 79–97)
Platelets: 249 x10E3/uL (ref 150–450)
RBC: 4.98 x10E6/uL (ref 3.77–5.28)
RDW: 12.5 % (ref 11.7–15.4)
WBC: 5.2 x10E3/uL (ref 3.4–10.8)

## 2024-01-03 NOTE — Progress Notes (Signed)
 Let patient know that her calcium  level is quite elevated.  Is she taking any vitamin D  supplement?  If so please hold off for now. Please return to the lab to have additional testing done to evaluate this further.  Please make sure that she is drinking at least 4 to 8 glasses of water daily. Blood cell counts are normal.

## 2024-01-04 ENCOUNTER — Ambulatory Visit: Payer: Self-pay | Admitting: Internal Medicine

## 2024-01-04 DIAGNOSIS — M1611 Unilateral primary osteoarthritis, right hip: Secondary | ICD-10-CM

## 2024-01-05 ENCOUNTER — Encounter: Payer: Self-pay | Admitting: Internal Medicine

## 2024-01-10 ENCOUNTER — Telehealth: Payer: Self-pay

## 2024-01-10 NOTE — Telephone Encounter (Signed)
 Message received from Dr Vicci stating: Needs home P.T. Order entered.  Roy Tokarz: please f/u with her to see who she met with last Thursday regarding change to one level apartment. See if she is still interested in [facility]. She wants Meals on Wheels as well.   I called the patient to inform her that the home health referral was placed and to inquire if she has a preference for home health agencies and she said she did not.  She has worked with [employer] in the past and was in agreement to referring back to them. I told her that I could do that but there is no guarantee that they will accept the referral.  I then asked her about an appointment regarding moving to a one floor apartment. She said she has a meeting with Jon, the investment banker, corporate this coming week. She stated that Jon is working on getting her the single level apartment that she needs   We also discussed [facility] and she stated she is over income and has spent down some money but did not think it is enough.  She was not interested in speaking with [facility] again at this time. I told her that the type of assistance she needs at home is supported by [facility], so PACE as well as [facility] are dependent upon an individual qualifying for Medicaid.  I asked again if she would ike me to contact [facility] but she said she knows she doesn't qualify.   I called [employer] to put the patient on a wait list for [facility] and I had to leave a message requesting a call back.  I then called [facility] and spoke to Marval Ross Manager: [phone] and she accepted the referral.  She stated that she can pull all of the information needed from Epic. She stated that they will see the patient Tues, 01/14/2024 and she did not have any questions at this time.

## 2024-01-13 ENCOUNTER — Inpatient Hospital Stay: Admitting: Adult Health

## 2024-01-14 ENCOUNTER — Other Ambulatory Visit: Payer: Self-pay | Admitting: Internal Medicine

## 2024-01-14 ENCOUNTER — Telehealth: Payer: Self-pay | Admitting: Adult Health

## 2024-01-14 NOTE — Telephone Encounter (Signed)
 Left a voice mail on the pts mobile number informing them of the changes

## 2024-01-17 ENCOUNTER — Telehealth: Payer: Self-pay | Admitting: Internal Medicine

## 2024-01-17 NOTE — Telephone Encounter (Signed)
 Copied from CRM [id number]. Topic: Clinical - Home Health Verbal Orders >> Jan 17, 2024 11:57 AM Donna BRAVO wrote:  Caller/Agency: [facility] Callback Number: [phone] Service Requested: Physical Therapy Frequency: new start date 01/21/24 Any new concerns about the patient? No

## 2024-01-17 NOTE — Telephone Encounter (Signed)
 Order given to [facility] Callback Number: [phone] Service Requested: Physical Therapy Frequency: new start date 01/21/24

## 2024-01-20 ENCOUNTER — Ambulatory Visit: Admitting: Internal Medicine

## 2024-01-25 ENCOUNTER — Other Ambulatory Visit (HOSPITAL_COMMUNITY): Payer: Self-pay

## 2024-01-25 MED ORDER — TRAMADOL HCL 50 MG PO TABS
50.0000 mg | ORAL_TABLET | Freq: Four times a day (QID) | ORAL | 0 refills | Status: AC | PRN
  Filled 2024-01-25 – 2024-01-30 (×2): qty 12, 3d supply, fill #0

## 2024-01-26 ENCOUNTER — Other Ambulatory Visit: Payer: Self-pay

## 2024-01-29 ENCOUNTER — Other Ambulatory Visit (HOSPITAL_COMMUNITY): Payer: Self-pay

## 2024-01-30 ENCOUNTER — Other Ambulatory Visit (HOSPITAL_COMMUNITY): Payer: Self-pay

## 2024-02-09 ENCOUNTER — Other Ambulatory Visit: Payer: Self-pay | Admitting: Internal Medicine

## 2024-02-14 ENCOUNTER — Telehealth: Payer: Self-pay | Admitting: Internal Medicine
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
## 2024-02-14 NOTE — Telephone Encounter (Signed)
 Tiffany from [facility], called to add previous notes, she needs to know if she is still supposed to still be taking this meds, amlopdine ,albuterorol ,trazadone, and atorvastatin . Caller also state, patient needs to someone to come to her home to help with medication management. Please call patient back

## 2024-02-18 ENCOUNTER — Telehealth: Payer: Self-pay

## 2024-02-18 ENCOUNTER — Inpatient Hospital Stay

## 2024-02-18 ENCOUNTER — Inpatient Hospital Stay: Admitting: Adult Health

## 2024-02-18 NOTE — Telephone Encounter (Signed)
 Called patient; no answer. Left voicemail requesting she call back if she would like to reschedule
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
## 2024-02-19 NOTE — Telephone Encounter (Signed)
 Noted. Thank you. Patient scheduled for a follow-up appointment with PCP on 03/06/24.

## 2024-02-20 ENCOUNTER — Telehealth: Payer: Self-pay | Admitting: Internal Medicine

## 2024-02-20 NOTE — Telephone Encounter (Signed)
 Please advise.

## 2024-02-20 NOTE — Telephone Encounter (Signed)
 Copied from CRM [id number]. Topic: Clinical - Medication Question >> Feb 20, 2024 12:23 PM Macario HERO wrote: Reason for CRM: Patient called requesting a call back from Dr. Vicci regarding her house nurse who is concerned about her medication not being up to date. Asked patient if she needs refills. Advised patient provider has to see her to make changes. Patient said she just wants to speak with her because she has not heard from her in so long.

## 2024-03-06 ENCOUNTER — Ambulatory Visit: Payer: Self-pay | Admitting: Internal Medicine

## 2024-03-13 ENCOUNTER — Inpatient Hospital Stay: Admitting: Adult Health

## 2024-05-12 ENCOUNTER — Ambulatory Visit: Payer: Self-pay
